# Patient Record
Sex: Female | Born: 1950 | Race: Black or African American | Hispanic: No | Marital: Single | Smoking: Current every day smoker
Health system: Southern US, Community
[De-identification: ages and names within clinical notes are randomized; demographics above are authoritative.]

## PROBLEM LIST (undated history)

## (undated) DIAGNOSIS — I639 Cerebral infarction, unspecified: Secondary | ICD-10-CM

## (undated) DIAGNOSIS — Z972 Presence of dental prosthetic device (complete) (partial): Secondary | ICD-10-CM

## (undated) DIAGNOSIS — I480 Paroxysmal atrial fibrillation: Secondary | ICD-10-CM

## (undated) DIAGNOSIS — D151 Benign neoplasm of heart: Secondary | ICD-10-CM

## (undated) DIAGNOSIS — K759 Inflammatory liver disease, unspecified: Secondary | ICD-10-CM

## (undated) DIAGNOSIS — F32A Depression, unspecified: Secondary | ICD-10-CM

## (undated) DIAGNOSIS — M549 Dorsalgia, unspecified: Secondary | ICD-10-CM

## (undated) DIAGNOSIS — G8929 Other chronic pain: Secondary | ICD-10-CM

## (undated) DIAGNOSIS — M797 Fibromyalgia: Secondary | ICD-10-CM

## (undated) DIAGNOSIS — I10 Essential (primary) hypertension: Secondary | ICD-10-CM

## (undated) DIAGNOSIS — K219 Gastro-esophageal reflux disease without esophagitis: Secondary | ICD-10-CM

## (undated) DIAGNOSIS — I495 Sick sinus syndrome: Secondary | ICD-10-CM

## (undated) DIAGNOSIS — F329 Major depressive disorder, single episode, unspecified: Secondary | ICD-10-CM

## (undated) DIAGNOSIS — F419 Anxiety disorder, unspecified: Secondary | ICD-10-CM

## (undated) DIAGNOSIS — Z973 Presence of spectacles and contact lenses: Secondary | ICD-10-CM

## (undated) DIAGNOSIS — M1990 Unspecified osteoarthritis, unspecified site: Secondary | ICD-10-CM

## (undated) HISTORY — PX: FRACTURE SURGERY: SHX138

## (undated) HISTORY — DX: Benign neoplasm of heart: D15.1

## (undated) HISTORY — PX: TONSILLECTOMY: SUR1361

## (undated) HISTORY — PX: ABDOMINAL HYSTERECTOMY: SHX81

## (undated) HISTORY — PX: BUNIONECTOMY: SHX129

## (undated) HISTORY — PX: SUBMANDIBULAR GLAND EXCISION: SHX2456

---

## 2005-01-18 ENCOUNTER — Emergency Department (HOSPITAL_COMMUNITY): Admission: EM | Admit: 2005-01-18 | Discharge: 2005-01-18 | Payer: Self-pay | Admitting: Emergency Medicine

## 2005-04-23 ENCOUNTER — Emergency Department (HOSPITAL_COMMUNITY): Admission: EM | Admit: 2005-04-23 | Discharge: 2005-04-23 | Payer: Self-pay | Admitting: Family Medicine

## 2005-05-10 ENCOUNTER — Emergency Department (HOSPITAL_COMMUNITY): Admission: EM | Admit: 2005-05-10 | Discharge: 2005-05-10 | Payer: Self-pay | Admitting: Family Medicine

## 2012-04-27 ENCOUNTER — Ambulatory Visit
Admission: RE | Admit: 2012-04-27 | Discharge: 2012-04-27 | Disposition: A | Discharge status: Home / Self Care | Payer: Medicare Other | Source: Ambulatory Visit | Attending: Nephrology | Admitting: Nephrology

## 2012-04-27 ENCOUNTER — Other Ambulatory Visit: Payer: Self-pay | Admitting: Nephrology

## 2012-04-27 DIAGNOSIS — I10 Essential (primary) hypertension: Secondary | ICD-10-CM

## 2012-05-19 ENCOUNTER — Ambulatory Visit: Payer: Self-pay | Admitting: Gastroenterology

## 2012-06-17 ENCOUNTER — Other Ambulatory Visit: Payer: Self-pay | Admitting: Nephrology

## 2012-06-17 ENCOUNTER — Ambulatory Visit
Admission: RE | Admit: 2012-06-17 | Discharge: 2012-06-17 | Disposition: A | Discharge status: Home / Self Care | Payer: Medicare Other | Source: Ambulatory Visit | Attending: Nephrology | Admitting: Nephrology

## 2012-06-17 DIAGNOSIS — M545 Low back pain, unspecified: Secondary | ICD-10-CM

## 2012-06-27 ENCOUNTER — Encounter (HOSPITAL_BASED_OUTPATIENT_CLINIC_OR_DEPARTMENT_OTHER): Payer: Self-pay | Admitting: *Deleted

## 2012-06-27 NOTE — Progress Notes (Signed)
To come in for bmet-called for ekg-notes dr frazier

## 2012-07-01 ENCOUNTER — Ambulatory Visit (HOSPITAL_BASED_OUTPATIENT_CLINIC_OR_DEPARTMENT_OTHER): Admission: RE | Admit: 2012-07-01 | Payer: Medicare Other | Source: Ambulatory Visit | Admitting: Otolaryngology

## 2012-07-01 ENCOUNTER — Encounter (HOSPITAL_BASED_OUTPATIENT_CLINIC_OR_DEPARTMENT_OTHER): Admission: RE | Payer: Self-pay | Source: Ambulatory Visit

## 2012-07-01 HISTORY — DX: Other chronic pain: G89.29

## 2012-07-01 HISTORY — DX: Fibromyalgia: M79.7

## 2012-07-01 HISTORY — DX: Presence of dental prosthetic device (complete) (partial): Z97.2

## 2012-07-01 HISTORY — DX: Major depressive disorder, single episode, unspecified: F32.9

## 2012-07-01 HISTORY — DX: Essential (primary) hypertension: I10

## 2012-07-01 HISTORY — DX: Other chronic pain: M54.9

## 2012-07-01 HISTORY — DX: Anxiety disorder, unspecified: F41.9

## 2012-07-01 HISTORY — DX: Presence of spectacles and contact lenses: Z97.3

## 2012-07-01 HISTORY — DX: Depression, unspecified: F32.A

## 2012-07-01 HISTORY — DX: Unspecified osteoarthritis, unspecified site: M19.90

## 2012-07-01 HISTORY — DX: Gastro-esophageal reflux disease without esophagitis: K21.9

## 2012-07-01 SURGERY — EXCISION, SUBMANDIBULAR GLAND
Anesthesia: General | Laterality: Right

## 2012-12-12 ENCOUNTER — Other Ambulatory Visit: Payer: Self-pay | Admitting: Internal Medicine

## 2012-12-12 DIAGNOSIS — B1920 Unspecified viral hepatitis C without hepatic coma: Secondary | ICD-10-CM

## 2012-12-19 ENCOUNTER — Ambulatory Visit
Admission: RE | Admit: 2012-12-19 | Discharge: 2012-12-19 | Disposition: A | Discharge status: Home / Self Care | Payer: Medicare Other | Source: Ambulatory Visit | Attending: Internal Medicine | Admitting: Internal Medicine

## 2012-12-19 DIAGNOSIS — B1920 Unspecified viral hepatitis C without hepatic coma: Secondary | ICD-10-CM

## 2015-10-07 DIAGNOSIS — I639 Cerebral infarction, unspecified: Secondary | ICD-10-CM

## 2015-10-07 HISTORY — DX: Cerebral infarction, unspecified: I63.9

## 2015-10-17 ENCOUNTER — Encounter (HOSPITAL_COMMUNITY): Payer: Self-pay | Admitting: Emergency Medicine

## 2015-10-17 ENCOUNTER — Inpatient Hospital Stay (HOSPITAL_COMMUNITY): Payer: Medicare Other

## 2015-10-17 ENCOUNTER — Inpatient Hospital Stay (HOSPITAL_COMMUNITY)
Admission: EM | Admit: 2015-10-17 | Discharge: 2015-10-24 | DRG: 065 | Disposition: A | Discharge status: Home Health | Payer: Medicare Other | Attending: Internal Medicine | Admitting: Internal Medicine

## 2015-10-17 ENCOUNTER — Emergency Department (HOSPITAL_COMMUNITY): Payer: Medicare Other

## 2015-10-17 DIAGNOSIS — M797 Fibromyalgia: Secondary | ICD-10-CM | POA: Diagnosis not present

## 2015-10-17 DIAGNOSIS — I6310 Cerebral infarction due to embolism of unspecified precerebral artery: Secondary | ICD-10-CM | POA: Diagnosis not present

## 2015-10-17 DIAGNOSIS — M549 Dorsalgia, unspecified: Secondary | ICD-10-CM | POA: Diagnosis present

## 2015-10-17 DIAGNOSIS — E669 Obesity, unspecified: Secondary | ICD-10-CM | POA: Diagnosis present

## 2015-10-17 DIAGNOSIS — R29810 Facial weakness: Secondary | ICD-10-CM | POA: Diagnosis present

## 2015-10-17 DIAGNOSIS — M6289 Other specified disorders of muscle: Secondary | ICD-10-CM

## 2015-10-17 DIAGNOSIS — R2990 Unspecified symptoms and signs involving the nervous system: Secondary | ICD-10-CM

## 2015-10-17 DIAGNOSIS — Z79899 Other long term (current) drug therapy: Secondary | ICD-10-CM | POA: Diagnosis not present

## 2015-10-17 DIAGNOSIS — E876 Hypokalemia: Secondary | ICD-10-CM | POA: Diagnosis not present

## 2015-10-17 DIAGNOSIS — G8191 Hemiplegia, unspecified affecting right dominant side: Secondary | ICD-10-CM | POA: Diagnosis present

## 2015-10-17 DIAGNOSIS — I6340 Cerebral infarction due to embolism of unspecified cerebral artery: Secondary | ICD-10-CM

## 2015-10-17 DIAGNOSIS — I639 Cerebral infarction, unspecified: Secondary | ICD-10-CM

## 2015-10-17 DIAGNOSIS — F17200 Nicotine dependence, unspecified, uncomplicated: Secondary | ICD-10-CM | POA: Insufficient documentation

## 2015-10-17 DIAGNOSIS — F17210 Nicotine dependence, cigarettes, uncomplicated: Secondary | ICD-10-CM | POA: Diagnosis present

## 2015-10-17 DIAGNOSIS — Z720 Tobacco use: Secondary | ICD-10-CM | POA: Insufficient documentation

## 2015-10-17 DIAGNOSIS — Z90710 Acquired absence of both cervix and uterus: Secondary | ICD-10-CM | POA: Diagnosis not present

## 2015-10-17 DIAGNOSIS — K219 Gastro-esophageal reflux disease without esophagitis: Secondary | ICD-10-CM | POA: Diagnosis present

## 2015-10-17 DIAGNOSIS — F329 Major depressive disorder, single episode, unspecified: Secondary | ICD-10-CM | POA: Diagnosis present

## 2015-10-17 DIAGNOSIS — G8929 Other chronic pain: Secondary | ICD-10-CM | POA: Diagnosis present

## 2015-10-17 DIAGNOSIS — I6789 Other cerebrovascular disease: Secondary | ICD-10-CM | POA: Diagnosis not present

## 2015-10-17 DIAGNOSIS — D219 Benign neoplasm of connective and other soft tissue, unspecified: Secondary | ICD-10-CM

## 2015-10-17 DIAGNOSIS — Z886 Allergy status to analgesic agent status: Secondary | ICD-10-CM

## 2015-10-17 DIAGNOSIS — D151 Benign neoplasm of heart: Secondary | ICD-10-CM | POA: Diagnosis not present

## 2015-10-17 DIAGNOSIS — F419 Anxiety disorder, unspecified: Secondary | ICD-10-CM | POA: Diagnosis present

## 2015-10-17 DIAGNOSIS — I10 Essential (primary) hypertension: Secondary | ICD-10-CM | POA: Diagnosis present

## 2015-10-17 DIAGNOSIS — Z791 Long term (current) use of non-steroidal anti-inflammatories (NSAID): Secondary | ICD-10-CM

## 2015-10-17 DIAGNOSIS — E785 Hyperlipidemia, unspecified: Secondary | ICD-10-CM | POA: Insufficient documentation

## 2015-10-17 DIAGNOSIS — M1990 Unspecified osteoarthritis, unspecified site: Secondary | ICD-10-CM | POA: Diagnosis present

## 2015-10-17 DIAGNOSIS — Z6830 Body mass index (BMI) 30.0-30.9, adult: Secondary | ICD-10-CM

## 2015-10-17 DIAGNOSIS — R471 Dysarthria and anarthria: Secondary | ICD-10-CM | POA: Diagnosis present

## 2015-10-17 DIAGNOSIS — I63413 Cerebral infarction due to embolism of bilateral middle cerebral arteries: Principal | ICD-10-CM | POA: Diagnosis present

## 2015-10-17 DIAGNOSIS — D213 Benign neoplasm of connective and other soft tissue of thorax: Secondary | ICD-10-CM | POA: Diagnosis not present

## 2015-10-17 DIAGNOSIS — I340 Nonrheumatic mitral (valve) insufficiency: Secondary | ICD-10-CM | POA: Diagnosis not present

## 2015-10-17 LAB — COMPREHENSIVE METABOLIC PANEL
ALBUMIN: 4.1 g/dL (ref 3.5–5.0)
ALK PHOS: 82 U/L (ref 38–126)
ALT: 27 U/L (ref 14–54)
AST: 40 U/L (ref 15–41)
Anion gap: 12 (ref 5–15)
BILIRUBIN TOTAL: 0.5 mg/dL (ref 0.3–1.2)
BUN: 8 mg/dL (ref 6–20)
CALCIUM: 9.2 mg/dL (ref 8.9–10.3)
CO2: 19 mmol/L — ABNORMAL LOW (ref 22–32)
Chloride: 109 mmol/L (ref 101–111)
Creatinine, Ser: 1.17 mg/dL — ABNORMAL HIGH (ref 0.44–1.00)
GFR calc Af Amer: 56 mL/min — ABNORMAL LOW (ref >60)
GFR calc non Af Amer: 49 mL/min — ABNORMAL LOW (ref >60)
GLUCOSE: 125 mg/dL — AB (ref 65–99)
Potassium: 3 mmol/L — ABNORMAL LOW (ref 3.5–5.1)
Sodium: 140 mmol/L (ref 135–145)
TOTAL PROTEIN: 7.9 g/dL (ref 6.5–8.1)

## 2015-10-17 LAB — I-STAT CHEM 8, ED
BUN: 11 mg/dL (ref 6–20)
CHLORIDE: 108 mmol/L (ref 101–111)
CREATININE: 1.1 mg/dL — AB (ref 0.44–1.00)
Calcium, Ion: 1.12 mmol/L — ABNORMAL LOW (ref 1.13–1.30)
GLUCOSE: 122 mg/dL — AB (ref 65–99)
HCT: 45 % (ref 36.0–46.0)
Hemoglobin: 15.3 g/dL — ABNORMAL HIGH (ref 12.0–15.0)
Potassium: 2.9 mmol/L — ABNORMAL LOW (ref 3.5–5.1)
Sodium: 143 mmol/L (ref 135–145)
TCO2: 21 mmol/L (ref 0–100)

## 2015-10-17 LAB — RAPID URINE DRUG SCREEN, HOSP PERFORMED
Amphetamines: NOT DETECTED
BARBITURATES: NOT DETECTED
Benzodiazepines: NOT DETECTED
Cocaine: NOT DETECTED
Opiates: POSITIVE — AB
TETRAHYDROCANNABINOL: NOT DETECTED

## 2015-10-17 LAB — URINALYSIS, ROUTINE W REFLEX MICROSCOPIC
Bilirubin Urine: NEGATIVE
Glucose, UA: NEGATIVE mg/dL
Hgb urine dipstick: NEGATIVE
KETONES UR: NEGATIVE mg/dL
LEUKOCYTES UA: NEGATIVE
NITRITE: NEGATIVE
PROTEIN: NEGATIVE mg/dL
Specific Gravity, Urine: 1.008 (ref 1.005–1.030)
pH: 5.5 (ref 5.0–8.0)

## 2015-10-17 LAB — DIFFERENTIAL
BASOS ABS: 0 10*3/uL (ref 0.0–0.1)
Basophils Relative: 0 %
EOS PCT: 4 %
Eosinophils Absolute: 0.2 10*3/uL (ref 0.0–0.7)
LYMPHS ABS: 3.3 10*3/uL (ref 0.7–4.0)
LYMPHS PCT: 55 %
Monocytes Absolute: 0.7 10*3/uL (ref 0.1–1.0)
Monocytes Relative: 11 %
NEUTROS PCT: 30 %
Neutro Abs: 1.8 10*3/uL (ref 1.7–7.7)

## 2015-10-17 LAB — CBC
HEMATOCRIT: 39.4 % (ref 36.0–46.0)
HEMOGLOBIN: 12.3 g/dL (ref 12.0–15.0)
MCH: 28.7 pg (ref 26.0–34.0)
MCHC: 31.2 g/dL (ref 30.0–36.0)
MCV: 92.1 fL (ref 78.0–100.0)
Platelets: 208 10*3/uL (ref 150–400)
RBC: 4.28 MIL/uL (ref 3.87–5.11)
RDW: 14 % (ref 11.5–15.5)
WBC: 6.1 10*3/uL (ref 4.0–10.5)

## 2015-10-17 LAB — I-STAT TROPONIN, ED: Troponin i, poc: 0 ng/mL (ref 0.00–0.08)

## 2015-10-17 LAB — APTT: aPTT: 35 seconds (ref 24–37)

## 2015-10-17 LAB — PROTIME-INR
INR: 1.08 (ref 0.00–1.49)
Prothrombin Time: 14.2 seconds (ref 11.6–15.2)

## 2015-10-17 LAB — ETHANOL: Alcohol, Ethyl (B): <5 mg/dL (ref <5)

## 2015-10-17 LAB — CBG MONITORING, ED: Glucose-Capillary: 108 mg/dL — ABNORMAL HIGH (ref 65–99)

## 2015-10-17 MED ORDER — TIZANIDINE HCL 4 MG PO TABS
4.0000 mg | ORAL_TABLET | Freq: Three times a day (TID) | ORAL | Status: DC | PRN
Start: 2015-10-17 — End: 2015-10-24
  Filled 2015-10-17: qty 1

## 2015-10-17 MED ORDER — POTASSIUM CHLORIDE CRYS ER 20 MEQ PO TBCR
40.0000 meq | EXTENDED_RELEASE_TABLET | Freq: Once | ORAL | Status: AC
  Administered 2015-10-17: 40 meq via ORAL
  Filled 2015-10-17: qty 2

## 2015-10-17 MED ORDER — TIZANIDINE HCL 4 MG PO CAPS: 4.0000 mg | ORAL_CAPSULE | Freq: Three times a day (TID) | ORAL | Status: DC | PRN

## 2015-10-17 MED ORDER — HEPARIN SODIUM (PORCINE) 5000 UNIT/ML IJ SOLN
5000.0000 [IU] | Freq: Three times a day (TID) | INTRAMUSCULAR | Status: DC
  Administered 2015-10-17 – 2015-10-18 (×2): 5000 [IU] via SUBCUTANEOUS
  Filled 2015-10-17 (×3): qty 1

## 2015-10-17 MED ORDER — PANTOPRAZOLE SODIUM 40 MG PO TBEC
80.0000 mg | DELAYED_RELEASE_TABLET | Freq: Every day | ORAL | Status: DC
  Administered 2015-10-17 – 2015-10-24 (×7): 80 mg via ORAL
  Filled 2015-10-17 (×7): qty 2

## 2015-10-17 MED ORDER — MIRTAZAPINE 15 MG PO TABS
15.0000 mg | ORAL_TABLET | Freq: Every day | ORAL | Status: DC
  Administered 2015-10-17: 15 mg via ORAL
  Filled 2015-10-17: qty 1

## 2015-10-17 MED ORDER — IOHEXOL 350 MG/ML SOLN
75.0000 mL | Freq: Once | INTRAVENOUS | Status: AC | PRN
  Administered 2015-10-17: 100 mL via INTRAVENOUS

## 2015-10-17 MED ORDER — BUSPIRONE HCL 10 MG PO TABS
15.0000 mg | ORAL_TABLET | Freq: Two times a day (BID) | ORAL | Status: DC
  Administered 2015-10-17 – 2015-10-24 (×13): 15 mg via ORAL
  Filled 2015-10-17 (×13): qty 2

## 2015-10-17 MED ORDER — DULOXETINE HCL 60 MG PO CPEP
60.0000 mg | ORAL_CAPSULE | Freq: Every day | ORAL | Status: DC
  Administered 2015-10-18 – 2015-10-24 (×6): 60 mg via ORAL
  Filled 2015-10-17 (×6): qty 1

## 2015-10-17 MED ORDER — STROKE: EARLY STAGES OF RECOVERY BOOK
Freq: Once | Status: AC
  Administered 2015-10-17: 1
  Filled 2015-10-17: qty 1

## 2015-10-17 MED ORDER — HYDROCODONE-ACETAMINOPHEN 10-325 MG PO TABS
1.0000 | ORAL_TABLET | Freq: Four times a day (QID) | ORAL | Status: DC | PRN
  Administered 2015-10-18 – 2015-10-24 (×13): 1 via ORAL
  Filled 2015-10-17 (×13): qty 1

## 2015-10-17 MED ORDER — POTASSIUM CHLORIDE 10 MEQ/100ML IV SOLN
10.0000 meq | INTRAVENOUS | Status: AC
  Administered 2015-10-17 (×2): 10 meq via INTRAVENOUS
  Filled 2015-10-17 (×2): qty 100

## 2015-10-17 MED ORDER — DICLOFENAC SODIUM 75 MG PO TBEC
75.0000 mg | DELAYED_RELEASE_TABLET | Freq: Two times a day (BID) | ORAL | Status: DC
  Administered 2015-10-17: 75 mg via ORAL
  Filled 2015-10-17 (×4): qty 1

## 2015-10-17 MED ORDER — PREGABALIN 75 MG PO CAPS: 75.0000 mg | ORAL_CAPSULE | Freq: Two times a day (BID) | ORAL | Status: DC

## 2015-10-17 MED ORDER — PREGABALIN 75 MG PO CAPS
150.0000 mg | ORAL_CAPSULE | Freq: Every day | ORAL | Status: DC
  Administered 2015-10-18 – 2015-10-24 (×6): 150 mg via ORAL
  Filled 2015-10-17 (×6): qty 2

## 2015-10-17 MED ORDER — PREGABALIN 75 MG PO CAPS
75.0000 mg | ORAL_CAPSULE | Freq: Every day | ORAL | Status: DC
  Administered 2015-10-17 – 2015-10-23 (×7): 75 mg via ORAL
  Filled 2015-10-17 (×7): qty 1

## 2015-10-17 MED ORDER — ZOLPIDEM TARTRATE 5 MG PO TABS
10.0000 mg | ORAL_TABLET | Freq: Every day | ORAL | Status: DC
  Administered 2015-10-17 – 2015-10-23 (×7): 10 mg via ORAL
  Filled 2015-10-17 (×7): qty 2

## 2015-10-17 NOTE — Code Documentation (Signed)
64yo female arriving to [facility] via [place] at 1600.  EMS reports that the patient was at home with family when she went to put plastic on the window.  Family later found her slumped over in a chair.  EMS called and activated a code stroke for right arm flaccid, left facial droop and garbled speech.  Patient with h/o depression, anxiety, fibromyalgia, HTN, DDD, and smoking.  Stroke team at the bedside on patient arrival.  Labs drawn and patient cleared by Dr. Darl Householder.  Patient to CT 1.  Patient back to Trauma A.  NIHSS 2, see documentation for details and code stroke times.  Patient with right arm drift and garbled speech on exam.  Dr. Nicole Kindred at the bedside for exam. Patient has been tearful since arrival.  No acute stroke treatment at this time.  Code stroke canceled at 1612 per Dr. Nicole Kindred.  Bedside handoff with ED RN Janett Billow.

## 2015-10-17 NOTE — Consult Note (Signed)
Admission H&P    Chief Complaint: Right-sided weakness.  HPI: Julia Schwartz is an 64 y.o. female with a history of hypertension, anxiety, depression and fibromyalgia, brought to the emergency room and code stroke status with a complaint of new onset right-sided weakness. She has no previous history of stroke nor TIA. She has not been on antiplatelet therapy. CT scan of the head showed no acute findings. Hyperdensity was noted involving the left ICA terminus. Significance is unclear. Patient's exam showed no clear objective deficits. She was also emotionally labile. Code stroke was subsequently canceled.  Past Medical History  Diagnosis Date  . Hypertension   . Anxiety   . Depression   . Arthritis   . GERD (gastroesophageal reflux disease)   . Chronic back pain   . Wears dentures     top-partial bottom  . Wears glasses   . Fibromyalgia     Past Surgical History  Procedure Laterality Date  . Submandibular gland excision      left  . Abdominal hysterectomy    . Tonsillectomy    . Bunionectomy      both feet  . Fracture surgery      fx lt ankle    History reviewed. No pertinent family history. Social History:  reports that she has been smoking.  She does not have any smokeless tobacco history on file. She reports that she does not drink alcohol or use illicit drugs.  Allergies:  Allergies  Allergen Reactions  . Ultram [Tramadol] Hives    Medications: Patient's preadmission medications were reviewed by me.  ROS: History obtained from patient and family.  General ROS: negative for - chills, fatigue, fever, night sweats, weight gain or weight loss Psychological ROS: negative for - behavioral disorder, hallucinations, memory difficulties, mood swings or suicidal ideation Ophthalmic ROS: negative for - blurry vision, double vision, eye pain or loss of vision ENT ROS: negative for - epistaxis, nasal discharge, oral lesions, sore throat, tinnitus or vertigo Allergy and  Immunology ROS: negative for - hives or itchy/watery eyes Hematological and Lymphatic ROS: negative for - bleeding problems, bruising or swollen lymph nodes Endocrine ROS: negative for - galactorrhea, hair pattern changes, polydipsia/polyuria or temperature intolerance Respiratory ROS: negative for - cough, hemoptysis, shortness of breath or wheezing Cardiovascular ROS: negative for - chest pain, dyspnea on exertion, edema or irregular heartbeat Gastrointestinal ROS: negative for - abdominal pain, diarrhea, hematemesis, nausea/vomiting or stool incontinence Genito-Urinary ROS: negative for - dysuria, hematuria, incontinence or urinary frequency/urgency Musculoskeletal ROS: negative for - joint swelling or muscular weakness Neurological ROS: as noted in HPI Dermatological ROS: negative for rash and skin lesion changes  Physical Examination: Blood pressure 140/89, pulse 106, temperature 100.1 F (37.8 C), temperature source Oral, SpO2 99 %.  HEENT-  Normocephalic, no lesions, without obvious abnormality.  Normal external eye and conjunctiva.  Normal TM's bilaterally.  Normal auditory canals and external ears. Normal external nose, mucus membranes and septum.  Normal pharynx. Neck supple with no masses, nodes, nodules or enlargement. Cardiovascular - regular rate and rhythm, S1, S2 normal, no murmur, click, rub or gallop Lungs - chest clear, no wheezing, rales, normal symmetric air entry Abdomen - soft, non-tender; bowel sounds normal; no masses,  no organomegaly Extremities - no joint deformities, effusion, or inflammation and no edema  Neurologic Examination: Mental Status: Alert, oriented, emotional and labile with tearfulness.  Speech equivocally slurred without evidence of aphasia. Able to follow commands but not cooperative at times. Cranial Nerves: II-Visual fields were  normal. III/IV/VI-Pupils were equal and reacted normally to light. Extraocular movements were full and conjugate.     V/VII-no facial numbness and no facial weakness. VIII-normal. X-equivocal dysarthria. XI: trapezius strength/neck flexion strength normal bilaterally XII-midline tongue extension with normal strength. Motor: Minimal drift of right upper extremity compared to left, with normal use of right upper extremity against gravity when distracted. Motor exam is otherwise unremarkable. Sensory: Normal throughout. Deep Tendon Reflexes: 2+ and symmetric. Plantars: Mute bilaterally Cerebellar: Normal finger-to-nose testing normal with use of left upper extremity; poor effort with testing of right upper extremity. Carotid auscultation: Normal  Results for orders placed or performed during the hospital encounter of 10/17/15 (from the past 48 hour(s))  Ethanol     Status: None   Collection Time: 10/17/15  4:06 PM  Result Value Ref Range   Alcohol, Ethyl (B) <5 <5 mg/dL    Comment:        LOWEST DETECTABLE LIMIT FOR SERUM ALCOHOL IS 5 mg/dL FOR MEDICAL PURPOSES ONLY   Protime-INR     Status: None   Collection Time: 10/17/15  4:06 PM  Result Value Ref Range   Prothrombin Time 14.2 11.6 - 15.2 seconds   INR 1.08 0.00 - 1.49  APTT     Status: None   Collection Time: 10/17/15  4:06 PM  Result Value Ref Range   aPTT 35 24 - 37 seconds  CBC     Status: None   Collection Time: 10/17/15  4:06 PM  Result Value Ref Range   WBC 6.1 4.0 - 10.5 K/uL   RBC 4.28 3.87 - 5.11 MIL/uL   Hemoglobin 12.3 12.0 - 15.0 g/dL   HCT 39.4 36.0 - 46.0 %   MCV 92.1 78.0 - 100.0 fL   MCH 28.7 26.0 - 34.0 pg   MCHC 31.2 30.0 - 36.0 g/dL   RDW 14.0 11.5 - 15.5 %   Platelets 208 150 - 400 K/uL  Differential     Status: None   Collection Time: 10/17/15  4:06 PM  Result Value Ref Range   Neutrophils Relative % 30 %   Neutro Abs 1.8 1.7 - 7.7 K/uL   Lymphocytes Relative 55 %   Lymphs Abs 3.3 0.7 - 4.0 K/uL   Monocytes Relative 11 %   Monocytes Absolute 0.7 0.1 - 1.0 K/uL   Eosinophils Relative 4 %   Eosinophils  Absolute 0.2 0.0 - 0.7 K/uL   Basophils Relative 0 %   Basophils Absolute 0.0 0.0 - 0.1 K/uL  Comprehensive metabolic panel     Status: Abnormal   Collection Time: 10/17/15  4:06 PM  Result Value Ref Range   Sodium 140 135 - 145 mmol/L   Potassium 3.0 (L) 3.5 - 5.1 mmol/L   Chloride 109 101 - 111 mmol/L   CO2 19 (L) 22 - 32 mmol/L   Glucose, Bld 125 (H) 65 - 99 mg/dL   BUN 8 6 - 20 mg/dL   Creatinine, Ser 1.17 (H) 0.44 - 1.00 mg/dL   Calcium 9.2 8.9 - 10.3 mg/dL   Total Protein 7.9 6.5 - 8.1 g/dL   Albumin 4.1 3.5 - 5.0 g/dL   AST 40 15 - 41 U/L   ALT 27 14 - 54 U/L   Alkaline Phosphatase 82 38 - 126 U/L   Total Bilirubin 0.5 0.3 - 1.2 mg/dL   GFR calc non Af Amer 49 (L) >60 mL/min   GFR calc Af Amer 56 (L) >60 mL/min    Comment: (  NOTE) The eGFR has been calculated using the CKD EPI equation. This calculation has not been validated in all clinical situations. eGFR's persistently <60 mL/min signify possible Chronic Kidney Disease.    Anion gap 12 5 - 15  I-stat troponin, ED (not at [facility], [facility])     Status: None   Collection Time: 10/17/15  4:08 PM  Result Value Ref Range   Troponin i, poc 0.00 0.00 - 0.08 ng/mL   Comment 3            Comment: Due to the release kinetics of cTnI, a negative result within the first hours of the onset of symptoms does not rule out myocardial infarction with certainty. If myocardial infarction is still suspected, repeat the test at appropriate intervals.   I-Stat Chem 8, ED  (not at [facility], [facility])     Status: Abnormal   Collection Time: 10/17/15  4:10 PM  Result Value Ref Range   Sodium 143 135 - 145 mmol/L   Potassium 2.9 (L) 3.5 - 5.1 mmol/L   Chloride 108 101 - 111 mmol/L   BUN 11 6 - 20 mg/dL   Creatinine, Ser 1.10 (H) 0.44 - 1.00 mg/dL   Glucose, Bld 122 (H) 65 - 99 mg/dL   Calcium, Ion 1.12 (L) 1.13 - 1.30 mmol/L   TCO2 21 0 - 100 mmol/L   Hemoglobin 15.3 (H) 12.0 - 15.0 g/dL   HCT 45.0 36.0 - 46.0 %  CBG monitoring, ED     Status:  Abnormal   Collection Time: 10/17/15  4:15 PM  Result Value Ref Range   Glucose-Capillary 108 (H) 65 - 99 mg/dL   Ct Head Wo Contrast  10/17/2015  ADDENDUM REPORT: 10/17/2015 16:40 ADDENDUM: Study discussed by telephone with Dr. Wallie Char on 10/17/2015 at 1624 hours. Electronically Signed   By: Genevie Ann M.D.   On: 10/17/2015 16:40  10/17/2015  CLINICAL DATA:  64 year old female code stroke. Right side weakness and abnormal speech. Last seen normal 1510 hours. Initial encounter. EXAM: CT HEAD WITHOUT CONTRAST TECHNIQUE: Contiguous axial images were obtained from the base of the skull through the vertex without intravenous contrast. COMPARISON:  None. FINDINGS: Visualized paranasal sinuses and mastoids are clear. No acute osseous abnormality identified. Visualized orbits and scalp soft tissues are within normal limits. Calcified atherosclerosis at the skull base.  Dural calcifications. Asymmetric hyperdensity at the left ICA terminus on series 201, image 7. No cortically based acute infarct identified. No acute intracranial hemorrhage identified. No midline shift, mass effect, or evidence of intracranial mass lesion. Gray-white matter differentiation is within normal limits throughout the brain. IMPRESSION: Suspicious asymmetric hyperdensity at the left ICA terminus, but otherwise normal for age noncontrast CT appearance of the brain. Electronically Signed: By: Genevie Ann M.D. On: 10/17/2015 16:20    Assessment/Plan 64 year old lady presenting with complaint of right-sided weakness with no objective signs of an acute neurologic deficit. Psychophysiologic factors are strongly suspected and contributing to this patient's symptomatology.  No further neurodiagnostic studies are indicated in follow-up MRI regarding left ICA terminus or density. Also recommend considering psychiatric evaluation with the patient's complaints of right-sided weakness continues.  C.R. Nicole Kindred, [facility] [phone]  10/17/2015, 5:26 PM

## 2015-10-17 NOTE — Progress Notes (Signed)
Called regarding MRI results. She is outside of the IV tpa window at this point but from the description of Dr. Nicole Kindred, it sounds like symptoms would not have merited tpa in any case.  She will need further evaluation regarding the source of her embolis, likely cardiac or aortic source.   1) CTA head and neck 2) Echocardiogram 3) a1c, LDL 4) frequent neuro checks 5) permissive htn 6) NPO pending bedside nursing swallow screen.  7) PT, OT, speech evals if needed 8) stroke team to follow.   Roland Rack, MD [facility] [phone]  If 7pm- 7am, please page neurology on call as listed in [place].

## 2015-10-17 NOTE — Discharge Instructions (Signed)
Stroke Prevention °Some health problems and behaviors may make it more likely for you to have a stroke. Below are ways to lessen your risk of having a stroke.  °· Be active for at least 30 minutes on most or all days. °· Do not smoke. Try not to be around others who smoke. °· Do not drink too much alcohol. °¨ Do not have more than 2 drinks a day if you are a man. °¨ Do not have more than 1 drink a day if you are a woman and are not pregnant. °· Eat healthy foods, such as fruits and vegetables. If you were put on a specific diet, follow the diet as told. °· Keep your cholesterol levels under control through diet and medicines. Look for foods that are low in saturated fat, trans fat, cholesterol, and are high in fiber. °· If you have diabetes, follow all diet plans and take your medicine as told. °· Ask your doctor if you need treatment to lower your blood pressure. If you have high blood pressure (hypertension), follow all diet plans and take your medicine as told by your doctor. °· If you are 18-39 years old, have your blood pressure checked every 3-5 years. If you are age 40 or older, have your blood pressure checked every year. °· Keep a healthy weight. Eat foods that are low in calories, salt, saturated fat, trans fat, and cholesterol. °· Do not take drugs. °· Avoid birth control pills, if this applies. Talk to your doctor about the risks of taking birth control pills. °· Talk to your doctor if you have sleep problems (sleep apnea). °· Take all medicine as told by your doctor. °¨ You may be told to take aspirin or blood thinner medicine. Take this medicine as told by your doctor. °¨ Understand your medicine instructions. °· Make sure any other conditions you have are being taken care of. °GET HELP RIGHT AWAY IF: °· You suddenly lose feeling (you feel numb) or have weakness in your face, arm, or leg. °· Your face or eyelid hangs down to one side. °· You suddenly feel confused. °· You have trouble talking (aphasia)  or understanding what people are saying. °· You suddenly have trouble seeing in one or both eyes. °· You suddenly have trouble walking. °· You are dizzy. °· You lose your balance or your movements are clumsy (uncoordinated). °· You suddenly have a very bad headache and you do not know the cause. °· You have new chest pain. °· Your heart feels like it is fluttering or skipping a beat (irregular heartbeat). °Do not wait to see if the symptoms above go away. Get help right away. Call your local emergency services (911 in [place] Do not drive yourself to the hospital. °  °This information is not intended to replace advice given to you by your health care provider. Make sure you discuss any questions you have with your health care provider. °  °Document Released: 04/05/2012 Document Revised: 10/26/2014 Document Reviewed: 04/07/2013 °Elsevier Interactive Patient Education ©2016 [employer] ° °

## 2015-10-17 NOTE — ED Notes (Signed)
Per EMS: pt found slumped in chair with garbled speech and right sided weakness; pt tearful at present

## 2015-10-17 NOTE — ED Provider Notes (Addendum)
CSN: [id number]     Arrival date & time 10/17/15  1600 History   First MD Initiated Contact with Patient 10/17/15 1603     Chief Complaint  Patient presents with  . Code Stroke   [name] w/PMH of HTN, DDD, GERD, fibromyalgia, and OA who is a smoker who presents by EMS for stroke sx. These started at [facility] and were witnessed by granddaughter. Pt had difficulty speaking, became tearful, and right side of body became weak/paralyzed and pt had left sided facial droop.  No recent illness or trauma.   (Consider location/radiation/quality/duration/timing/severity/associated sxs/prior Treatment) Patient is a 64 y.o. female presenting with Acute Neurological Problem.  Cerebrovascular Accident This is a new problem. The current episode started today. The problem occurs constantly. The problem has been unchanged. Associated symptoms include weakness (right sided). Pertinent negatives include no abdominal pain, chest pain, chills, fever, headaches, nausea, visual change or vomiting. Nothing aggravates the symptoms. She has tried nothing for the symptoms. The treatment provided no relief.    Past Medical History  Diagnosis Date  . Hypertension   . Anxiety   . Depression   . Arthritis   . GERD (gastroesophageal reflux disease)   . Chronic back pain   . Wears dentures     top-partial bottom  . Wears glasses   . Fibromyalgia    Past Surgical History  Procedure Laterality Date  . Submandibular gland excision      left  . Abdominal hysterectomy    . Tonsillectomy    . Bunionectomy      both feet  . Fracture surgery      fx lt ankle   History reviewed. No pertinent family history. Social History  Substance Use Topics  . Smoking status: Current Every Day Smoker -- 0.50 packs/day  . Smokeless tobacco: None  . Alcohol Use: No   OB History    No data available     Review of Systems  Unable to perform ROS: Mental status change  Constitutional: Negative for fever and chills.  Respiratory:  Negative for shortness of breath.   Cardiovascular: Negative for chest pain, palpitations and leg swelling.  Gastrointestinal: Negative for nausea, vomiting, abdominal pain, diarrhea, constipation and abdominal distention.  Genitourinary: Negative for dysuria, frequency, flank pain and decreased urine volume.  Skin: Negative for wound.  Neurological: Positive for weakness (right sided). Negative for dizziness, speech difficulty, light-headedness and headaches.  All other systems reviewed and are negative.     Allergies  Ultram  Home Medications   Prior to Admission medications   Medication Sig Start Date End Date Taking? Authorizing Provider  amLODipine-benazepril (LOTREL) 10-40 MG per capsule Take 1 capsule by mouth daily.   Yes Historical Provider, MD  busPIRone (BUSPAR) 15 MG tablet Take 15 mg by mouth 2 (two) times daily.    Yes Historical Provider, MD  diclofenac (VOLTAREN) 75 MG EC tablet Take 75 mg by mouth 2 (two) times daily.   Yes Historical Provider, MD  DULoxetine (CYMBALTA) 60 MG capsule Take 60 mg by mouth daily.   Yes Historical Provider, MD  furosemide (LASIX) 20 MG tablet Take 20 mg by mouth daily.   Yes Historical Provider, MD  HYDROcodone-acetaminophen (NORCO) 10-325 MG per tablet Take 1 tablet by mouth every 6 (six) hours as needed for moderate pain or severe pain.    Yes Historical Provider, MD  mirtazapine (REMERON) 15 MG tablet Take 15 mg by mouth at bedtime.   Yes Historical Provider, MD  omeprazole (PRILOSEC) 10 MG capsule Take 10 mg by mouth daily.   Yes Historical Provider, MD  omeprazole (PRILOSEC) 20 MG capsule Take 20 mg by mouth daily.   Yes Historical Provider, MD  pregabalin (LYRICA) 75 MG capsule Take 75-150 mg by mouth 2 (two) times daily. 150mg  in the morning and 75mg  in the evening   Yes Historical Provider, MD  tiZANidine (ZANAFLEX) 4 MG capsule Take 4 mg by mouth 3 (three) times daily as needed for muscle spasms.    Yes Historical Provider, MD   zolpidem (AMBIEN) 10 MG tablet Take 10 mg by mouth at bedtime.    Yes Historical Provider, MD   BP 108/75 mmHg  Pulse 94  Temp(Src) 100.1 F (37.8 C) (Oral)  Resp 13  SpO2 100% Physical Exam  Constitutional: She appears well-developed and well-nourished. No distress.  tearful  HENT:  Head: Normocephalic and atraumatic.  Cardiovascular: Normal rate, regular rhythm, normal heart sounds and intact distal pulses.  Exam reveals no gallop and no friction rub.   No murmur heard. Pulmonary/Chest: Effort normal and breath sounds normal. No respiratory distress. She has no wheezes. She has no rales. She exhibits no tenderness.  Abdominal: Soft. Bowel sounds are normal. She exhibits no distension and no mass. There is no tenderness. There is no rebound and no guarding.  Lymphadenopathy:    She has no cervical adenopathy.  Neurological: She is alert. A cranial nerve deficit (unclear, pt not talking) is present. Coordination (not moving arms/legs, unclear if this is true deficit or volitional) abnormal. GCS eye subscore is 4. GCS verbal subscore is 1. GCS motor subscore is 4.  Skin: Skin is warm and dry. She is not diaphoretic.  Psychiatric: She is withdrawn. She exhibits a depressed mood. She expresses suicidal ideation. She is noncommunicative.  Nursing note and vitals reviewed.   ED Course  .Critical Care Performed by: Sherian Maroon Authorized by: Sherian Maroon Total critical care time: 15 minutes Critical care time was exclusive of separately billable procedures and treating other patients. Critical care was necessary to treat or prevent imminent or life-threatening deterioration of the following conditions: CNS failure or compromise. Critical care was time spent personally by me on the following activities: obtaining history from patient or surrogate, ordering and performing treatments and interventions, re-evaluation of patient's condition, discussions with consultants, ordering and review of  laboratory studies and examination of patient. Subsequent provider of critical care: I assumed direction of critical care for this patient from another provider of my specialty.   (including critical care time) Labs Review Labs Reviewed  COMPREHENSIVE METABOLIC PANEL - Abnormal; Notable for the following:    Potassium 3.0 (*)    CO2 19 (*)    Glucose, Bld 125 (*)    Creatinine, Ser 1.17 (*)    GFR calc non Af Amer 49 (*)    GFR calc Af Amer 56 (*)    All other components within normal limits  URINE RAPID DRUG SCREEN, HOSP PERFORMED - Abnormal; Notable for the following:    Opiates POSITIVE (*)    All other components within normal limits  I-STAT CHEM 8, ED - Abnormal; Notable for the following:    Potassium 2.9 (*)    Creatinine, Ser 1.10 (*)    Glucose, Bld 122 (*)    Calcium, Ion 1.12 (*)    Hemoglobin 15.3 (*)    All other components within normal limits  CBG MONITORING, ED - Abnormal; Notable for the following:    Glucose-Capillary  108 (*)    All other components within normal limits  ETHANOL  PROTIME-INR  APTT  CBC  DIFFERENTIAL  URINALYSIS, ROUTINE W REFLEX MICROSCOPIC (NOT AT [facility])  Randolm Idol, ED    Imaging Review Ct Head Wo Contrast  10/17/2015  ADDENDUM REPORT: 10/17/2015 16:40 ADDENDUM: Study discussed by telephone with Dr. Wallie Char on 10/17/2015 at 1624 hours. Electronically Signed   By: Genevie Ann M.D.   On: 10/17/2015 16:40  10/17/2015  CLINICAL DATA:  64 year old female code stroke. Right side weakness and abnormal speech. Last seen normal 1510 hours. Initial encounter. EXAM: CT HEAD WITHOUT CONTRAST TECHNIQUE: Contiguous axial images were obtained from the base of the skull through the vertex without intravenous contrast. COMPARISON:  None. FINDINGS: Visualized paranasal sinuses and mastoids are clear. No acute osseous abnormality identified. Visualized orbits and scalp soft tissues are within normal limits. Calcified atherosclerosis at the skull  base.  Dural calcifications. Asymmetric hyperdensity at the left ICA terminus on series 201, image 7. No cortically based acute infarct identified. No acute intracranial hemorrhage identified. No midline shift, mass effect, or evidence of intracranial mass lesion. Gray-white matter differentiation is within normal limits throughout the brain. IMPRESSION: Suspicious asymmetric hyperdensity at the left ICA terminus, but otherwise normal for age noncontrast CT appearance of the brain. Electronically Signed: By: Genevie Ann M.D. On: 10/17/2015 16:20   Mr Brain Wo Contrast  10/17/2015  CLINICAL DATA:  Code stroke status with new onset of right-sided weakness. EXAM: MRI HEAD WITHOUT CONTRAST TECHNIQUE: Multiplanar, multiecho pulse sequences of the brain and surrounding structures were obtained without intravenous contrast. COMPARISON:  Head CT same day FINDINGS: No abnormality is seen affecting the brainstem. No acute infarction affecting the cerebellum. I think there are a few old small vessel infarctions within the cerebellum. Cerebral hemispheres show background pattern of a few small foci of T2 and FLAIR signal in the white matter suggesting mild small vessel disease. There are scattered punctate acute infarctions in both hemispheres, much more extensive on the left than the right, largely in a watershed distribution. These could represent a shower of micro embolic emboli or could actually relate to watershed infarction. No large confluent infarction. No mass effect or gross hemorrhage. No hydrocephalus. No extra-axial collection. No pituitary mass. No inflammatory sinus disease. No skull or skullbase lesion. Major vessels at the base of the brain show flow. IMPRESSION: Innumerable punctate infarctions affecting both cerebral hemispheres, much more extensive on the left than the right, which could be due to a shower of micro emboli or watershed infarctions. No large confluent infarction. No mass effect or hemorrhage.  Electronically Signed   By: Nelson Chimes M.D.   On: 10/17/2015 19:09   I have personally reviewed and evaluated these images and lab results as part of my medical decision-making.   EKG Interpretation   Date/Time:  Thursday October 17 2015 16:22:12 EST Ventricular Rate:  103 PR Interval:  129 QRS Duration: 92 QT Interval:  362 QTC Calculation: 474 R Axis:   68 Text Interpretation:  Sinus tachycardia Low voltage, precordial leads  Borderline T wave abnormalities No previous ECGs available Confirmed by  YAO  MD, DAVID ([id number]) on 10/17/2015 4:25:25 PM      MDM   Final diagnoses:  Stroke-like symptoms  Hypokalemia   [name] who presents as code stroke. See HPI for details. On arrival, code stroke initiated. Airway intact, brought to CT scanner.   Pt now able to speak, following commands. She is  tearful and endorses she's been depressed recently and wants to die. She denies any HI or active SI w/plan.   4:21 PM Code stroke cancelled by Neurology given that story and exam not indicative of acute stroke. Head CT wnl. Will proceed w/brain MRI to ensure no sign of stroke.   HypoK-->repleted.   MRI shows many infarctions scalletered through both hemispheres: R>>L. Will admit for con't workup and observation. Pt remains stable w/no active deficits on exam. Admit to hospitalist for cont'd workup and treatment.   8:20 PM  Pt now demanding she be discharged and doesn't want to be admitted. She understands she has stroke burden on MRI and is at high risk for having more severe future strokes. Despite this she still opts to go home. I feel she has the reasoning capabilities to make her own medical decisions.  Pt to sign AMA paperwork.  8:29 PM Pt now shivering. Given low grade fever, concern for septic emboli. Hospitalist and myself urging pt to reconsider and to stay for workup. Pt admitted.     Pt was seen under the supervision of Dr. Darl Householder.     Sherian Maroon, MD 10/17/15 2002  Wandra Arthurs, MD 10/17/15 2013   Sherian Maroon, MD 10/17/15 [id number]  Wandra Arthurs, MD 10/17/15 [phone]

## 2015-10-17 NOTE — H&P (Signed)
[facility] History and Physical  Julia Schwartz [id number] DOB: [birth year] DOA: 10/17/2015  Referring physician: EDP PCP: No primary care provider on file.   Chief Complaint: R sided weakness   HPI: Julia Schwartz is a 64 y.o. female with h/o HTN, anxiety, depression.  Brought to ED as code stroke status for new onset R sided weakness, code stroke status was canceled as she had returned to baseline.  No h/o prior stroke or TIA.  CT scan of head was negative, MRI brain; however, shows bilateral innumerable punctate acute infarcts c/w embolic showering.  Review of Systems: Systems reviewed.  As above, otherwise negative  Past Medical History  Diagnosis Date  . Hypertension   . Anxiety   . Depression   . Arthritis   . GERD (gastroesophageal reflux disease)   . Chronic back pain   . Wears dentures     top-partial bottom  . Wears glasses   . Fibromyalgia    Past Surgical History  Procedure Laterality Date  . Submandibular gland excision      left  . Abdominal hysterectomy    . Tonsillectomy    . Bunionectomy      both feet  . Fracture surgery      fx lt ankle   Social History:  reports that she has been smoking.  She does not have any smokeless tobacco history on file. She reports that she does not drink alcohol or use illicit drugs.  Allergies  Allergen Reactions  . Ultram [Tramadol] Hives    History reviewed. No pertinent family history.   Prior to Admission medications   Medication Sig Start Date End Date Taking? Authorizing Provider  amLODipine-benazepril (LOTREL) 10-40 MG per capsule Take 1 capsule by mouth daily.   Yes Historical Provider, MD  busPIRone (BUSPAR) 15 MG tablet Take 15 mg by mouth 2 (two) times daily.    Yes Historical Provider, MD  diclofenac (VOLTAREN) 75 MG EC tablet Take 75 mg by mouth 2 (two) times daily.   Yes Historical Provider, MD  DULoxetine (CYMBALTA) 60 MG capsule Take 60 mg by mouth daily.   Yes Historical Provider, MD   furosemide (LASIX) 20 MG tablet Take 20 mg by mouth daily.   Yes Historical Provider, MD  HYDROcodone-acetaminophen (NORCO) 10-325 MG per tablet Take 1 tablet by mouth every 6 (six) hours as needed for moderate pain or severe pain.    Yes Historical Provider, MD  mirtazapine (REMERON) 15 MG tablet Take 15 mg by mouth at bedtime.   Yes Historical Provider, MD  omeprazole (PRILOSEC) 10 MG capsule Take 10 mg by mouth daily.   Yes Historical Provider, MD  omeprazole (PRILOSEC) 20 MG capsule Take 20 mg by mouth daily.   Yes Historical Provider, MD  pregabalin (LYRICA) 75 MG capsule Take 75-150 mg by mouth 2 (two) times daily. 150mg  in the morning and 75mg  in the evening   Yes Historical Provider, MD  tiZANidine (ZANAFLEX) 4 MG capsule Take 4 mg by mouth 3 (three) times daily as needed for muscle spasms.    Yes Historical Provider, MD  zolpidem (AMBIEN) 10 MG tablet Take 10 mg by mouth at bedtime.    Yes Historical Provider, MD   Physical Exam: Filed Vitals:   10/17/15 1800 10/17/15 1931  BP: 115/92 108/75  Pulse: 94   Temp:    Resp: 19 13    BP 108/75 mmHg  Pulse 94  Temp(Src) 100.1 F (37.8 C) (Oral)  Resp 13  SpO2 100%  General Appearance:    Alert, oriented, no distress, appears stated age  Head:    Normocephalic, atraumatic  Eyes:    PERRL, EOMI, sclera non-icteric        Nose:   Nares without drainage or epistaxis. Mucosa, turbinates normal  Throat:   Moist mucous membranes. Oropharynx without erythema or exudate.  Neck:   Supple. No carotid bruits.  No thyromegaly.  No lymphadenopathy.   Back:     No CVA tenderness, no spinal tenderness  Lungs:     Clear to auscultation bilaterally, without wheezes, rhonchi or rales  Chest wall:    No tenderness to palpitation  Heart:    Regular rate and rhythm without murmurs, gallops, rubs  Abdomen:     Soft, non-tender, nondistended, normal bowel sounds, no organomegaly  Genitalia:    deferred  Rectal:    deferred  Extremities:   No  clubbing, cyanosis or edema.  Pulses:   2+ and symmetric all extremities  Skin:   Skin color, texture, turgor normal, no rashes or lesions  Lymph nodes:   Cervical, supraclavicular, and axillary nodes normal  Neurologic:   CNII-XII intact. Normal strength, sensation and reflexes      throughout    Labs on Admission:  Basic Metabolic Panel:  Recent Labs Lab 10/17/15 1606 10/17/15 1610  NA 140 143  K 3.0* 2.9*  CL 109 108  CO2 19*  --   GLUCOSE 125* 122*  BUN 8 11  CREATININE 1.17* 1.10*  CALCIUM 9.2  --    Liver Function Tests:  Recent Labs Lab 10/17/15 1606  AST 40  ALT 27  ALKPHOS 82  BILITOT 0.5  PROT 7.9  ALBUMIN 4.1   No results for input(s): LIPASE, AMYLASE in the last 168 hours. No results for input(s): AMMONIA in the last 168 hours. CBC:  Recent Labs Lab 10/17/15 1606 10/17/15 1610  WBC 6.1  --   NEUTROABS 1.8  --   HGB 12.3 15.3*  HCT 39.4 45.0  MCV 92.1  --   PLT 208  --    Cardiac Enzymes: No results for input(s): CKTOTAL, CKMB, CKMBINDEX, TROPONINI in the last 168 hours.  BNP (last 3 results) No results for input(s): PROBNP in the last 8760 hours. CBG:  Recent Labs Lab 10/17/15 1615  GLUCAP 108*    Radiological Exams on Admission: Ct Head Wo Contrast  10/17/2015  ADDENDUM REPORT: 10/17/2015 16:40 ADDENDUM: Study discussed by telephone with Dr. Wallie Char on 10/17/2015 at 1624 hours. Electronically Signed   By: Genevie Ann M.D.   On: 10/17/2015 16:40  10/17/2015  CLINICAL DATA:  64 year old female code stroke. Right side weakness and abnormal speech. Last seen normal 1510 hours. Initial encounter. EXAM: CT HEAD WITHOUT CONTRAST TECHNIQUE: Contiguous axial images were obtained from the base of the skull through the vertex without intravenous contrast. COMPARISON:  None. FINDINGS: Visualized paranasal sinuses and mastoids are clear. No acute osseous abnormality identified. Visualized orbits and scalp soft tissues are within normal limits.  Calcified atherosclerosis at the skull base.  Dural calcifications. Asymmetric hyperdensity at the left ICA terminus on series 201, image 7. No cortically based acute infarct identified. No acute intracranial hemorrhage identified. No midline shift, mass effect, or evidence of intracranial mass lesion. Gray-white matter differentiation is within normal limits throughout the brain. IMPRESSION: Suspicious asymmetric hyperdensity at the left ICA terminus, but otherwise normal for age noncontrast CT appearance of the brain. Electronically Signed: By: Genevie Ann M.D. On: 10/17/2015 16:20  Mr Brain Wo Contrast  10/17/2015  CLINICAL DATA:  Code stroke status with new onset of right-sided weakness. EXAM: MRI HEAD WITHOUT CONTRAST TECHNIQUE: Multiplanar, multiecho pulse sequences of the brain and surrounding structures were obtained without intravenous contrast. COMPARISON:  Head CT same day FINDINGS: No abnormality is seen affecting the brainstem. No acute infarction affecting the cerebellum. I think there are a few old small vessel infarctions within the cerebellum. Cerebral hemispheres show background pattern of a few small foci of T2 and FLAIR signal in the white matter suggesting mild small vessel disease. There are scattered punctate acute infarctions in both hemispheres, much more extensive on the left than the right, largely in a watershed distribution. These could represent a shower of micro embolic emboli or could actually relate to watershed infarction. No large confluent infarction. No mass effect or gross hemorrhage. No hydrocephalus. No extra-axial collection. No pituitary mass. No inflammatory sinus disease. No skull or skullbase lesion. Major vessels at the base of the brain show flow. IMPRESSION: Innumerable punctate infarctions affecting both cerebral hemispheres, much more extensive on the left than the right, which could be due to a shower of micro emboli or watershed infarctions. No large confluent  infarction. No mass effect or hemorrhage. Electronically Signed   By: Nelson Chimes M.D.   On: 10/17/2015 19:09    EKG: Independently reviewed.  Assessment/Plan Principal Problem:   Embolic stroke (HCC) Active Problems:   HTN (hypertension)   1. Embolic stroke - likely cardiac source with bilateral findings on MRI 1. Stroke work up pathway 2. CTA being done now 3. Needs TTE, if TTE is negative, then will require TEE 4. BCx pending given temperature of 100.1 in ED 5. DDx includes cholesterol, thrombotic, and septic cardiac sources of the emboli.  Obviously cannot yet determine what medicine (ABx, blood thinners, etc) to put patient on until this is determined. 2. HTN - Permissive HTN, holding BP meds 3. Chronic back pain - continue home meds    Code Status: Full Code  Family Communication: Family at bedside, patient initially reluctant to stay, had extensive discussion about importance of this, family confirms that mentally she is at baseline status however.  Hopefully she will stay to complete work up and not leave AMA. Disposition Plan: Admit to inpatient   Time spent: 70 min  Dalinda Heidt M. [facility] Pager [phone]  If 7AM-7PM, please contact the day team taking care of the patient [email or web address] Password [email or web address] 10/17/2015, 8:43 PM

## 2015-10-17 NOTE — ED Notes (Signed)
Pt back from MRI. Ambulated to bathroom. No distress noted.

## 2015-10-17 NOTE — ED Notes (Signed)
Patient transported to MRI 

## 2015-10-17 NOTE — Progress Notes (Signed)
Pt arrived to unit alert and oriented. Oriented to room and questions answered. Call bell at side. Bed alarm activated. Will continue to monitor. Bobbye Charleston, RN

## 2015-10-18 ENCOUNTER — Encounter (HOSPITAL_COMMUNITY): Payer: Self-pay | Admitting: *Deleted

## 2015-10-18 ENCOUNTER — Ambulatory Visit (HOSPITAL_BASED_OUTPATIENT_CLINIC_OR_DEPARTMENT_OTHER): Payer: Medicare Other

## 2015-10-18 ENCOUNTER — Encounter (HOSPITAL_COMMUNITY)
Admission: EM | Disposition: A | Discharge status: Home Health | Payer: Self-pay | Source: Home / Self Care | Attending: Internal Medicine

## 2015-10-18 ENCOUNTER — Inpatient Hospital Stay (HOSPITAL_COMMUNITY): Payer: Medicare Other

## 2015-10-18 DIAGNOSIS — I639 Cerebral infarction, unspecified: Secondary | ICD-10-CM

## 2015-10-18 DIAGNOSIS — I517 Cardiomegaly: Secondary | ICD-10-CM

## 2015-10-18 DIAGNOSIS — F17200 Nicotine dependence, unspecified, uncomplicated: Secondary | ICD-10-CM

## 2015-10-18 DIAGNOSIS — D151 Benign neoplasm of heart: Secondary | ICD-10-CM | POA: Diagnosis present

## 2015-10-18 DIAGNOSIS — I10 Essential (primary) hypertension: Secondary | ICD-10-CM | POA: Insufficient documentation

## 2015-10-18 DIAGNOSIS — I6789 Other cerebrovascular disease: Secondary | ICD-10-CM | POA: Diagnosis not present

## 2015-10-18 DIAGNOSIS — E785 Hyperlipidemia, unspecified: Secondary | ICD-10-CM | POA: Insufficient documentation

## 2015-10-18 DIAGNOSIS — I63413 Cerebral infarction due to embolism of bilateral middle cerebral arteries: Principal | ICD-10-CM

## 2015-10-18 DIAGNOSIS — I340 Nonrheumatic mitral (valve) insufficiency: Secondary | ICD-10-CM

## 2015-10-18 HISTORY — PX: TEE WITHOUT CARDIOVERSION: SHX5443

## 2015-10-18 LAB — LIPID PANEL
CHOL/HDL RATIO: 3.4 ratio
CHOLESTEROL: 141 mg/dL (ref 0–200)
HDL: 42 mg/dL (ref >40)
LDL Cholesterol: 83 mg/dL (ref 0–99)
Triglycerides: 78 mg/dL (ref <150)
VLDL: 16 mg/dL (ref 0–40)

## 2015-10-18 LAB — ANTITHROMBIN III: ANTITHROMB III FUNC: 83 % (ref 75–120)

## 2015-10-18 LAB — C-REACTIVE PROTEIN: CRP: <0.5 mg/dL (ref <1.0)

## 2015-10-18 LAB — SEDIMENTATION RATE: SED RATE: 16 mm/h (ref 0–22)

## 2015-10-18 SURGERY — ECHOCARDIOGRAM, TRANSESOPHAGEAL
Anesthesia: Moderate Sedation

## 2015-10-18 SURGERY — LOOP RECORDER INSERTION
Anesthesia: LOCAL

## 2015-10-18 MED ORDER — ATORVASTATIN CALCIUM 10 MG PO TABS
20.0000 mg | ORAL_TABLET | Freq: Every day | ORAL | Status: DC
  Administered 2015-10-19 – 2015-10-23 (×5): 20 mg via ORAL
  Filled 2015-10-18 (×7): qty 2

## 2015-10-18 MED ORDER — SODIUM CHLORIDE 0.9 % IV SOLN
INTRAVENOUS | Status: DC
  Administered 2015-10-18: 04:00:00 via INTRAVENOUS

## 2015-10-18 MED ORDER — HEPARIN (PORCINE) IN NACL 100-0.45 UNIT/ML-% IJ SOLN
800.0000 [IU]/h | INTRAMUSCULAR | Status: DC
  Administered 2015-10-18 – 2015-10-22 (×3): 800 [IU]/h via INTRAVENOUS
  Filled 2015-10-18 (×5): qty 250

## 2015-10-18 MED ORDER — MIDAZOLAM HCL 5 MG/ML IJ SOLN
INTRAMUSCULAR | Status: AC
  Filled 2015-10-18: qty 2

## 2015-10-18 MED ORDER — SODIUM CHLORIDE 0.9 % IV SOLN
INTRAVENOUS | Status: DC
Start: 2015-10-18 — End: 2015-10-18

## 2015-10-18 MED ORDER — FENTANYL CITRATE (PF) 100 MCG/2ML IJ SOLN
INTRAMUSCULAR | Status: AC
  Filled 2015-10-18: qty 2

## 2015-10-18 MED ORDER — MIDAZOLAM HCL 10 MG/2ML IJ SOLN
INTRAMUSCULAR | Status: DC | PRN
  Administered 2015-10-18 (×2): 2 mg via INTRAVENOUS

## 2015-10-18 MED ORDER — SODIUM CHLORIDE 0.9 % IV BOLUS (SEPSIS)
1000.0000 mL | Freq: Once | INTRAVENOUS | Status: AC
  Administered 2015-10-18: 1000 mL via INTRAVENOUS

## 2015-10-18 MED ORDER — BUTAMBEN-TETRACAINE-BENZOCAINE 2-2-14 % EX AERO
INHALATION_SPRAY | CUTANEOUS | Status: DC | PRN
  Administered 2015-10-18: 2 via TOPICAL

## 2015-10-18 MED ORDER — DIPHENHYDRAMINE HCL 50 MG/ML IJ SOLN
INTRAMUSCULAR | Status: AC
  Filled 2015-10-18: qty 1

## 2015-10-18 MED ORDER — ASPIRIN 325 MG PO TABS
325.0000 mg | ORAL_TABLET | Freq: Every day | ORAL | Status: DC
  Administered 2015-10-18: 325 mg via ORAL
  Filled 2015-10-18: qty 1

## 2015-10-18 MED ORDER — FENTANYL CITRATE (PF) 100 MCG/2ML IJ SOLN
INTRAMUSCULAR | Status: DC | PRN
  Administered 2015-10-18: 25 ug via INTRAVENOUS

## 2015-10-18 NOTE — Progress Notes (Signed)
*  Preliminary Results* Bilateral lower extremity venous duplex completed. Study was technically limited and difficult due to poor patient cooperation and patient body habitus. Visualized veins of bilateral lower extremities are negative for deep vein thrombosis. There is no evidence of Baker's cyst bilaterally.  10/18/2015  Maudry Mayhew, RVT, RDCS, RDMS

## 2015-10-18 NOTE — Progress Notes (Signed)
STROKE TEAM PROGRESS NOTE   HISTORY Julia Schwartz is an 64 y.o. female with a history of hypertension, anxiety, depression and fibromyalgia, brought to the emergency room and code stroke status with a complaint of new onset right-sided weakness. She has no previous history of stroke nor TIA. She has not been on antiplatelet therapy. CT scan of the head showed no acute findings. Hyperdensity was noted involving the left ICA terminus. Significance is unclear. Patient's exam showed no clear objective deficits. She was also emotionally labile. Code stroke was subsequently canceled. Patient was not considered for TPA secondary to no neurologic deficit. She was admitted for further evaluation and treatment.   SUBJECTIVE (INTERVAL HISTORY) Her daughter is at the bedside.  Overall she feels her condition is stable. Patient lethargic from this am. Will do TEE today. Still has right hemiparesis and right facial droop.   OBJECTIVE Temp:  [98.2 F (36.8 C)-100.1 F (37.8 C)] 98.4 F (36.9 C) (12/30 1010) Pulse Rate:  [75-110] 78 (12/30 1010) Cardiac Rhythm:  [-] Normal sinus rhythm (12/30 0700) Resp:  [13-22] 18 (12/30 1010) BP: (74-153)/(52-92) 153/88 mmHg (12/30 1010) SpO2:  [96 %-100 %] 100 % (12/30 1010) Weight:  [74.254 kg (163 lb 11.2 oz)] 74.254 kg (163 lb 11.2 oz) (12/29 2153)  CBC:   Recent Labs Lab 10/17/15 1606 10/17/15 1610  WBC 6.1  --   NEUTROABS 1.8  --   HGB 12.3 15.3*  HCT 39.4 45.0  MCV 92.1  --   PLT 208  --     Basic Metabolic Panel:   Recent Labs Lab 10/17/15 1606 10/17/15 1610  NA 140 143  K 3.0* 2.9*  CL 109 108  CO2 19*  --   GLUCOSE 125* 122*  BUN 8 11  CREATININE 1.17* 1.10*  CALCIUM 9.2  --     Lipid Panel:     Component Value Date/Time   CHOL 141 10/18/2015 0539   TRIG 78 10/18/2015 0539   HDL 42 10/18/2015 0539   CHOLHDL 3.4 10/18/2015 0539   VLDL 16 10/18/2015 0539   LDLCALC 83 10/18/2015 0539   HgbA1c: No results found for:  HGBA1C Urine Drug Screen:     Component Value Date/Time   LABOPIA POSITIVE* 10/17/2015 1810   COCAINSCRNUR NONE DETECTED 10/17/2015 1810   LABBENZ NONE DETECTED 10/17/2015 1810   AMPHETMU NONE DETECTED 10/17/2015 1810   THCU NONE DETECTED 10/17/2015 1810   LABBARB NONE DETECTED 10/17/2015 1810      IMAGING I have personally reviewed the radiological images below and agree with the radiology interpretations.  Ct Head Wo Contrast 10/17/2015   Suspicious asymmetric hyperdensity at the left ICA terminus, but otherwise normal for age noncontrast CT appearance of the brain.   CT HEAD 10/17/2015   : No abnormal intracranial enhancement.   CTA NECK 10/17/2015   : No acute vascular process or hemodynamically significant stenosis.   CTA HEAD 10/17/2015   : No acute large vessel occlusion or high-grade stenosis. Complete circle of Willis.   Mr Brain Wo Contrast 10/17/2015   Innumerable punctate infarctions affecting both cerebral hemispheres, much more extensive on the left than the right, which could be due to a shower of micro emboli or watershed infarctions. No large confluent infarction. No mass effect or hemorrhage.   Bilateral lower extremity venous duplex  Visualized veins of bilateral lower extremities are negative for deep vein thrombosis. There is no evidence of Baker's cyst bilaterally.  2D Echocardiogram  - Left ventricle: The cavity  size was normal. There was mild focalbasal hypertrophy of the septum. Systolic function was normal.The estimated ejection fraction was 55%. Wall motion was normal;there were no regional wall motion abnormalities. - Atrial septum: No defect or patent foramen ovale was identified. - Impressions:  No cardiac source of emboli was indentified.  TEE - There is a large mobile echodensity attached to the supero-anterior portion of the interatrial septum from the left atrial side. The echodensity measures 27 x 29 mm and has one largest lobe with  multiple smaller thin mobile parts. The mass appears myxomatous. It is not obstructing flow.  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 A cardiac MRI is recommended for further evaluation.   MRI heart - pending   PHYSICAL EXAM  Temp:  [98.2 F (36.8 C)-99 F (37.2 C)] 98.4 F (36.9 C) (12/30 1723) Pulse Rate:  [75-110] 82 (12/30 1723) Resp:  [13-22] 18 (12/30 1723) BP: (74-153)/(52-92) 116/73 mmHg (12/30 1723) SpO2:  [98 %-100 %] 100 % (12/30 1723) Weight:  [163 lb 11.2 oz (74.254 kg)] 163 lb 11.2 oz (74.254 kg) (12/29 2153)  General - Well nourished, well developed, in no apparent distress, but lethargic.  Ophthalmologic - Fundi not visualized due to noncooperation.  Cardiovascular - Regular rate and rhythm.  Mental Status -  Level of arousal and orientation to place, and person were intact, but not to time. On language exam, pt has significant perseveration, fluent in sentences, 1/4 naming, able to repeat, following simple commands.  Cranial Nerves II - XII - II - right upper quadrant quadranopia. III, IV, VI - Extraocular movements intact. V - Facial sensation intact bilaterally. VII - right facial droop. VIII - Hearing & vestibular intact bilaterally. X - Palate elevates symmetrically. XI - Chin turning & shoulder shrug intact bilaterally XII - Tongue protrusion intact.  Motor Strength - The patient's strength was right UE proximal 4/5 and distal 0/5 with pronator drift, RLE proximal 5-/5 and distal 4-/5. LLE and LUE 5/5. Bulk was normal and fasciculations were absent.   Motor Tone - Muscle tone was assessed at the neck and appendages and was normal.  Reflexes - The patient's reflexes were 1+ in all extremities and she had no pathological reflexes.  Sensory - Light touch, temperature/pinprick were assessed and were symmetrical.    Coordination - The patient had normal movements in the hands with no ataxia or dysmetria, but  slow on the right.  Tremor was absent.  Gait and Station - not tested.   ASSESSMENT/PLAN Ms. Julia Schwartz is a 64 y.o. female with history of hypertension, anxiety, depression and fibromyalgia presenting with right-sided weakness. She did not receive IV t-PA due to no observable deficits.   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   Heparin iv for VTE prophylaxis Diet NPO time specified  No antithrombotic prior to admission, now on aspirin 325 mg daily. Recommend heparin drip.  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  Hypertension  Runs on the lower side up until 1010 this am 74/52, 84/58 - this am 153/88 Permissive hypertension (OK if < 220/120) but gradually normalize in 5-7 days  Hyperlipidemia  Home meds:  No statin  LDL 83, goal < 70  Now on lipitor 20  Continue statin at discharge  Tobacco abuse  Current smoker  Smoking cessation counseling provided  Pt is willing to quit  Other Stroke Risk Factors  Obesity, Body mass index is 30.95 kg/(m^2).   Other Active Problems  Fibromyalgia   Depression/anxiety  GERD   chronic back  pain  Hospital day # 1  Rosalin Hawking, MD PhD Stroke Neurology 10/18/2015 5:46 PM   To contact Stroke Continuity provider, please refer to [email or web address] After hours, contact General Neurology

## 2015-10-18 NOTE — Progress Notes (Signed)
Utilization Review Completed.Julia Schwartz T12/30/2016  

## 2015-10-18 NOTE — Progress Notes (Signed)
PT Cancellation Note  Patient Details Name: Julia Schwartz MRN: [id number] DOB: [birth year]   Cancelled Treatment:    Reason Eval/Treat Not Completed: Patient at procedure or test/unavailable. Began taking patient's history and checking BP when transport arrived to take pt to test. Will attempt to see later today.   [name] 10/18/2015, 8:54 AM  Pager [phone]

## 2015-10-18 NOTE — Progress Notes (Signed)
  Echocardiogram 2D Echocardiogram has been performed.  Darlina Sicilian M 10/18/2015, 9:49 AM

## 2015-10-18 NOTE — H&P (View-Only) (Signed)
Called regarding MRI results. She is outside of the IV tpa window at this point but from the description of Dr. Nicole Kindred, it sounds like symptoms would not have merited tpa in any case.  She will need further evaluation regarding the source of her embolis, likely cardiac or aortic source.   1) CTA head and neck 2) Echocardiogram 3) a1c, LDL 4) frequent neuro checks 5) permissive htn 6) NPO pending bedside nursing swallow screen.  7) PT, OT, speech evals if needed 8) stroke team to follow.   Roland Rack, MD [facility] [phone]  If 7pm- 7am, please page neurology on call as listed in [place].

## 2015-10-18 NOTE — Interval H&P Note (Signed)
History and Physical Interval Note:  10/18/2015 12:37 PM  Julia Schwartz  has presented today for surgery, with the diagnosis of CVA  The various methods of treatment have been discussed with the patient and family. After consideration of risks, benefits and other options for treatment, the patient has consented to  Procedure(s): TRANSESOPHAGEAL ECHOCARDIOGRAM (TEE) (N/A) as a surgical intervention .  The patient's history has been reviewed, patient examined, no change in status, stable for surgery.  I have reviewed the patient's chart and labs.  Questions were answered to the patient's satisfaction.     Seanne Spark

## 2015-10-18 NOTE — Progress Notes (Addendum)
PATIENT DETAILS Name: Julia Schwartz Age: 64 y.o. Sex: female Date of Birth: 1951-07-17 Admit Date: 10/17/2015 Admitting Physician Etta Quill, DO PCP:No primary care provider on file.  Subjective: Slightly lethargic-but with slightly dysarthric and slow speech. Continues to have right-sided deficits.  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 Dr. Wyline Beady a heparin infusion. Continues to have significant right-sided deficits, await PT/OT evaluation  Active Problems: Hypokalemia: Replete and recheck.  HTN (hypertension): Allow mild permissive hypertension setting of acute CVA-continue to hold antihypertensives for now.  GERD: Continue PPI  Anxiety/depression: appears stable-continue with BuSpar, Cymbalta, Remeron  Disposition: Remain inpatient  Antimicrobial agents  See below  Anti-infectives    None      DVT Prophylaxis: Heparin gtt  Code Status: Full code   Family Communication Daughter at bedside  Procedures: TEE 12/30  CONSULTS:  cardiology and neurology  Time spent 30 minutes-Greater than 50% of this time was spent in counseling, explanation of diagnosis, planning of further management, and coordination of care.  MEDICATIONS: Scheduled Meds: . [MAR Hold] aspirin  325 mg Oral Daily  . [MAR Hold] atorvastatin  20 mg Oral q1800  . [MAR Hold] busPIRone  15 mg Oral BID  . [MAR Hold] diclofenac  75 mg Oral BID  . [MAR Hold] DULoxetine  60 mg Oral Daily  . [MAR Hold] heparin  5,000 Units Subcutaneous 3 times per day  . [MAR Hold] mirtazapine  15 mg Oral QHS  . [MAR Hold] pantoprazole  80 mg Oral Daily  . [MAR Hold] pregabalin  150 mg Oral Daily  . [MAR Hold] pregabalin  75 mg Oral QHS  . [MAR Hold] zolpidem  10 mg Oral QHS   Continuous Infusions: . sodium chloride 100 mL/hr at 10/18/15 0800    . sodium chloride     PRN Meds:.[MAR Hold] HYDROcodone-acetaminophen, [MAR Hold] tiZANidine    PHYSICAL EXAM: Vital signs in last 24 hours: Filed Vitals:   10/18/15 1355 10/18/15 1400 10/18/15 1405 10/18/15 1410  BP: 115/62 110/55  108/59  Pulse: 85 79 76 79  Temp:      TempSrc:      Resp: 18 16 16 15   Height:      Weight:      SpO2: 100% 100% 100% 98%    Weight change:  Filed Weights   10/17/15 2153  Weight: 74.254 kg (163 lb 11.2 oz)   Body mass index is 30.95 kg/(m^2).   Gen Exam: Awake-slight dysarthria-slow speech  Neck: Supple, No JVD.   Chest: B/L Clear.   CVS: S1 S2 Regular, no murmurs.  Abdomen: soft, BS +, non tender, non distended.  Extremities: no edema, lower extremities warm to touch. Neurologic: RUE/RLE 3+- 4/5   Skin: No Rash.   Wounds: N/A.    Intake/Output from previous day:  Intake/Output Summary (Last 24 hours) at 10/18/15 1418 Last data filed at 10/18/15 0800  Gross per 24 hour  Intake    700 ml  Output      0 ml  Net    700 ml     LAB RESULTS: CBC  Recent Labs Lab 10/17/15 1606 10/17/15 1610  WBC 6.1  --   HGB 12.3 15.3*  HCT 39.4 45.0  PLT 208  --   MCV 92.1  --   MCH 28.7  --  MCHC 31.2  --   RDW 14.0  --   LYMPHSABS 3.3  --   MONOABS 0.7  --   EOSABS 0.2  --   BASOSABS 0.0  --     Chemistries   Recent Labs Lab 10/17/15 1606 10/17/15 1610  NA 140 143  K 3.0* 2.9*  CL 109 108  CO2 19*  --   GLUCOSE 125* 122*  BUN 8 11  CREATININE 1.17* 1.10*  CALCIUM 9.2  --     CBG:  Recent Labs Lab 10/17/15 1615  GLUCAP 108*    GFR Estimated Creatinine Clearance: 48.3 mL/min (by C-G formula based on Cr of 1.1).  Coagulation profile  Recent Labs Lab 10/17/15 1606  INR 1.08    Cardiac Enzymes No results for input(s): CKMB, TROPONINI, MYOGLOBIN in the last 168 hours.  Invalid input(s): CK  Invalid input(s): POCBNP No results for input(s): DDIMER in the last 72 hours. No results for input(s): HGBA1C  in the last 72 hours.  Recent Labs  10/18/15 0539  CHOL 141  HDL 42  LDLCALC 83  TRIG 78  CHOLHDL 3.4   No results for input(s): TSH, T4TOTAL, T3FREE, THYROIDAB in the last 72 hours.  Invalid input(s): FREET3 No results for input(s): VITAMINB12, FOLATE, FERRITIN, TIBC, IRON, RETICCTPCT in the last 72 hours. No results for input(s): LIPASE, AMYLASE in the last 72 hours.  Urine Studies No results for input(s): UHGB, CRYS in the last 72 hours.  Invalid input(s): UACOL, UAPR, USPG, UPH, UTP, UGL, UKET, UBIL, UNIT, UROB, ULEU, UEPI, UWBC, URBC, UBAC, CAST, UCOM, BILUA  MICROBIOLOGY: No results found for this or any previous visit (from the past 240 hour(s)).  RADIOLOGY STUDIES/RESULTS: Ct Angio Head W/cm &/or Wo Cm  10/17/2015  CLINICAL DATA:  Followup stroke symptoms.  History of hypertension. EXAM: CT ANGIOGRAPHY HEAD AND NECK TECHNIQUE: Multidetector CT imaging of the head and neck was performed using the standard protocol during bolus administration of intravenous contrast. Multiplanar CT image reconstructions and MIPs were obtained to evaluate the vascular anatomy. Carotid stenosis measurements (when applicable) are obtained utilizing NASCET criteria, using the distal internal carotid diameter as the denominator. CONTRAST:  168mL OMNIPAQUE IOHEXOL 350 MG/ML SOLN COMPARISON:  MRI of the brain October 17, 2015 at 1838 hours. FINDINGS: CTA NECK Aortic arch: Normal appearance of the thoracic arch, normal branch pattern. The origins of the innominate, left Common carotid artery and subclavian artery are widely patent. Right carotid system: Common carotid artery is widely patent, coursing in a straight line fashion. Normal appearance of the carotid bifurcation without hemodynamically significant stenosis by NASCET criteria. Minimal eccentric calcific atherosclerosis and intimal thickening. Normal appearance of the included internal carotid artery. Left carotid system: Common carotid artery is  widely patent, coursing in a straight line fashion. Normal appearance of the carotid bifurcation without hemodynamically significant stenosis by NASCET criteria. Minimal eccentric calcific atherosclerosis and intimal thickening. Normal appearance of the included internal carotid artery. Vertebral arteries:Left vertebral artery is dominant. Normal appearance of the vertebral arteries, which appear widely patent. Skeleton: No acute osseous process though bone windows have not been submitted. Patient is edentulous. 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 No destructive bony lesions. Other neck: Soft tissues of the neck are nonacute though, not tailored for evaluation. Included heart appears mildly enlarged. Prominent main pulmonary artery, incompletely imaged. Calcified hilar lymph nodes. Atrophic versus resected LEFT submandibular gland. Centrally edematous RIGHT submandibular gland without sialolith or inflammatory changes. CTA HEAD Anterior circulation: Patent cervical internal carotid  arteries, petrous, cavernous and supra clinoid internal carotid arteries. Moderate calcific atherosclerosis of the carotid siphons. Widely patent anterior communicating artery. Normal appearance of the anterior and middle cerebral arteries. Supernumerary anterior cerebral artery arising from LEFT A1-2 junction. Posterior circulation: Normal appearance of the vertebral arteries, vertebrobasilar junction and basilar artery, as well as main branch vessels. Bilateral posterior communicating arteries contribute to posterior circulation. Normal appearance of the posterior cerebral arteries. No large vessel occlusion, hemodynamically significant stenosis, dissection, luminal irregularity, contrast extravasation or aneurysm within the anterior nor posterior circulation. No abnormal intracranial enhancement on delayed phase. IMPRESSION: CT HEAD: No abnormal intracranial enhancement. CTA NECK: No acute vascular process or  hemodynamically significant stenosis. CTA HEAD: No acute large vessel occlusion or high-grade stenosis. Complete circle of Willis. Electronically Signed   By: Elon Alas M.D.   On: 10/17/2015 21:46   Ct Head Wo Contrast  10/17/2015  ADDENDUM REPORT: 10/17/2015 16:40 ADDENDUM: Study discussed by telephone with Dr. Wallie Char on 10/17/2015 at 1624 hours. Electronically Signed   By: Genevie Ann M.D.   On: 10/17/2015 16:40  10/17/2015  CLINICAL DATA:  64 year old female code stroke. Right side weakness and abnormal speech. Last seen normal 1510 hours. Initial encounter. EXAM: CT HEAD WITHOUT CONTRAST TECHNIQUE: Contiguous axial images were obtained from the base of the skull through the vertex without intravenous contrast. COMPARISON:  None. FINDINGS: Visualized paranasal sinuses and mastoids are clear. No acute osseous abnormality identified. Visualized orbits and scalp soft tissues are within normal limits. Calcified atherosclerosis at the skull base.  Dural calcifications. Asymmetric hyperdensity at the left ICA terminus on series 201, image 7. No cortically based acute infarct identified. No acute intracranial hemorrhage identified. No midline shift, mass effect, or evidence of intracranial mass lesion. Gray-white matter differentiation is within normal limits throughout the brain. IMPRESSION: Suspicious asymmetric hyperdensity at the left ICA terminus, but otherwise normal for age noncontrast CT appearance of the brain. Electronically Signed: By: Genevie Ann M.D. On: 10/17/2015 16:20   Ct Angio Neck W/cm &/or Wo/cm  10/17/2015  CLINICAL DATA:  Followup stroke symptoms.  History of hypertension. EXAM: CT ANGIOGRAPHY HEAD AND NECK TECHNIQUE: Multidetector CT imaging of the head and neck was performed using the standard protocol during bolus administration of intravenous contrast. Multiplanar CT image reconstructions and MIPs were obtained to evaluate the vascular anatomy. Carotid stenosis measurements  (when applicable) are obtained utilizing NASCET criteria, using the distal internal carotid diameter as the denominator. CONTRAST:  139mL OMNIPAQUE IOHEXOL 350 MG/ML SOLN COMPARISON:  MRI of the brain October 17, 2015 at 1838 hours. FINDINGS: CTA NECK Aortic arch: Normal appearance of the thoracic arch, normal branch pattern. The origins of the innominate, left Common carotid artery and subclavian artery are widely patent. Right carotid system: Common carotid artery is widely patent, coursing in a straight line fashion. Normal appearance of the carotid bifurcation without hemodynamically significant stenosis by NASCET criteria. Minimal eccentric calcific atherosclerosis and intimal thickening. Normal appearance of the included internal carotid artery. Left carotid system: Common carotid artery is widely patent, coursing in a straight line fashion. Normal appearance of the carotid bifurcation without hemodynamically significant stenosis by NASCET criteria. Minimal eccentric calcific atherosclerosis and intimal thickening. Normal appearance of the included internal carotid artery. Vertebral arteries:Left vertebral artery is dominant. Normal appearance of the vertebral arteries, which appear widely patent. Skeleton: No acute osseous process though bone windows have not been submitted. Patient is edentulous. 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 No destructive bony lesions. Other neck:  Soft tissues of the neck are nonacute though, not tailored for evaluation. Included heart appears mildly enlarged. Prominent main pulmonary artery, incompletely imaged. Calcified hilar lymph nodes. Atrophic versus resected LEFT submandibular gland. Centrally edematous RIGHT submandibular gland without sialolith or inflammatory changes. CTA HEAD Anterior circulation: Patent cervical internal carotid arteries, petrous, cavernous and supra clinoid internal carotid arteries. Moderate calcific atherosclerosis of the carotid  siphons. Widely patent anterior communicating artery. Normal appearance of the anterior and middle cerebral arteries. Supernumerary anterior cerebral artery arising from LEFT A1-2 junction. Posterior circulation: Normal appearance of the vertebral arteries, vertebrobasilar junction and basilar artery, as well as main branch vessels. Bilateral posterior communicating arteries contribute to posterior circulation. Normal appearance of the posterior cerebral arteries. No large vessel occlusion, hemodynamically significant stenosis, dissection, luminal irregularity, contrast extravasation or aneurysm within the anterior nor posterior circulation. No abnormal intracranial enhancement on delayed phase. IMPRESSION: CT HEAD: No abnormal intracranial enhancement. CTA NECK: No acute vascular process or hemodynamically significant stenosis. CTA HEAD: No acute large vessel occlusion or high-grade stenosis. Complete circle of Willis. Electronically Signed   By: Elon Alas M.D.   On: 10/17/2015 21:46   Mr Brain Wo Contrast  10/17/2015  CLINICAL DATA:  Code stroke status with new onset of right-sided weakness. EXAM: MRI HEAD WITHOUT CONTRAST TECHNIQUE: Multiplanar, multiecho pulse sequences of the brain and surrounding structures were obtained without intravenous contrast. COMPARISON:  Head CT same day FINDINGS: No abnormality is seen affecting the brainstem. No acute infarction affecting the cerebellum. I think there are a few old small vessel infarctions within the cerebellum. Cerebral hemispheres show background pattern of a few small foci of T2 and FLAIR signal in the white matter suggesting mild small vessel disease. There are scattered punctate acute infarctions in both hemispheres, much more extensive on the left than the right, largely in a watershed distribution. These could represent a shower of micro embolic emboli or could actually relate to watershed infarction. No large confluent infarction. No mass effect  or gross hemorrhage. No hydrocephalus. No extra-axial collection. No pituitary mass. No inflammatory sinus disease. No skull or skullbase lesion. Major vessels at the base of the brain show flow. IMPRESSION: Innumerable punctate infarctions affecting both cerebral hemispheres, much more extensive on the left than the right, which could be due to a shower of micro emboli or watershed infarctions. No large confluent infarction. No mass effect or hemorrhage. Electronically Signed   By: Nelson Chimes M.D.   On: 10/17/2015 19:09    Oren Binet, MD  [facility] Pager:[phone]  If 7PM-7AM, please contact night-coverage [email or web address] Password [email or web address] 10/18/2015, 2:18 PM   LOS: 1 day

## 2015-10-18 NOTE — Consult Note (Addendum)
ELECTROPHYSIOLOGY CONSULT NOTE  Patient ID: Julia Schwartz MRN: [id number], DOB/AGE: 22-Aug-1951   Admit date: 10/17/2015 Date of Consult: 10/18/2015  Primary Physician: No primary care provider on file. Primary Cardiologist: new to [facility] Reason for Consultation: Cryptogenic stroke; recommendations regarding Implantable Loop Recorder  History of Present Illness Julia Schwartz was admitted on 10/17/2015 with new onset right sided weakness.  Imaging demonstrated bilateral punctate infarcts.  She has undergone workup for stroke including echocardiogram and carotid dopplers.  The patient has been monitored on telemetry which has demonstrated sinus rhythm with no arrhythmias.  Inpatient stroke work-up is to be completed with a TEE.   Echocardiogram this admission demonstrated EF 55%, no RWMA, LA 25.    Prior to admission, the patient denies chest pain, shortness of breath, dizziness, palpitations, or syncope.  They are recovering from their stroke with plans to return home at discharge.  EP has been asked to evaluate for placement of an implantable loop recorder to monitor for atrial fibrillation.   Past Medical History  Diagnosis Date  . Hypertension   . Anxiety   . Depression   . Arthritis   . GERD (gastroesophageal reflux disease)   . Chronic back pain   . Wears dentures     top-partial bottom  . Wears glasses   . Fibromyalgia      Surgical History:  Past Surgical History  Procedure Laterality Date  . Submandibular gland excision      left  . Abdominal hysterectomy    . Tonsillectomy    . Bunionectomy      both feet  . Fracture surgery      fx lt ankle     Prescriptions prior to admission  Medication Sig Dispense Refill Last Dose  . amLODipine-benazepril (LOTREL) 10-40 MG per capsule Take 1 capsule by mouth daily.   10/17/2015 at Unknown time  . busPIRone (BUSPAR) 15 MG tablet Take 15 mg by mouth 2 (two) times daily.    10/17/2015 at Unknown time  .  diclofenac (VOLTAREN) 75 MG EC tablet Take 75 mg by mouth 2 (two) times daily.   10/17/2015 at Unknown time  . DULoxetine (CYMBALTA) 60 MG capsule Take 60 mg by mouth daily.   10/17/2015 at Unknown time  . furosemide (LASIX) 20 MG tablet Take 20 mg by mouth daily.   10/17/2015 at Unknown time  . HYDROcodone-acetaminophen (NORCO) 10-325 MG per tablet Take 1 tablet by mouth every 6 (six) hours as needed for moderate pain or severe pain.    unknown  . mirtazapine (REMERON) 15 MG tablet Take 15 mg by mouth at bedtime.   10/16/2015 at Unknown time  . omeprazole (PRILOSEC) 10 MG capsule Take 10 mg by mouth daily.   10/17/2015 at Unknown time  . omeprazole (PRILOSEC) 20 MG capsule Take 20 mg by mouth daily.   10/17/2015 at Unknown time  . pregabalin (LYRICA) 75 MG capsule Take 75-150 mg by mouth 2 (two) times daily. 150mg  in the morning and 75mg  in the evening   10/17/2015 at Unknown time  . tiZANidine (ZANAFLEX) 4 MG capsule Take 4 mg by mouth 3 (three) times daily as needed for muscle spasms.    unknown  . zolpidem (AMBIEN) 10 MG tablet Take 10 mg by mouth at bedtime.    10/16/2015 at Unknown time    Inpatient Medications:  . aspirin  325 mg Oral Daily  . atorvastatin  20 mg Oral q1800  . busPIRone  15 mg Oral BID  .  diclofenac  75 mg Oral BID  . DULoxetine  60 mg Oral Daily  . heparin  5,000 Units Subcutaneous 3 times per day  . mirtazapine  15 mg Oral QHS  . pantoprazole  80 mg Oral Daily  . pregabalin  150 mg Oral Daily  . pregabalin  75 mg Oral QHS  . zolpidem  10 mg Oral QHS    Allergies:  Allergies  Allergen Reactions  . Ultram [Tramadol] Hives    Social History   Social History  . Marital Status: Single    Spouse Name: N/A  . Number of Children: N/A  . Years of Education: N/A   Occupational History  . Not on file.   Social History Main Topics  . Smoking status: Current Every Day Smoker -- 0.50 packs/day  . Smokeless tobacco: Not on file  . Alcohol Use: No  . Drug Use:  No  . Sexual Activity: Not on file   Other Topics Concern  . Not on file   Social History Narrative     Family History: no premature CAD   Review of Systems: All other systems reviewed and are otherwise negative except as noted above.  Physical Exam: Filed Vitals:   10/18/15 0221 10/18/15 0352 10/18/15 0548 10/18/15 1010  BP: 74/52 102/64 99/64 153/88  Pulse:  81 75 78  Temp:  98.2 F (36.8 C) 98.2 F (36.8 C) 98.4 F (36.9 C)  TempSrc:  Oral Oral Oral  Resp:  16 18 18   Height:      Weight:      SpO2:  98% 100% 100%    GEN- The patient is elderly appearing, very lethargic but arouses and answers questions appropriately   Head- normocephalic, atraumatic Eyes-  Sclera clear, conjunctiva pink Ears- hearing intact Oropharynx- clear Neck- supple Lungs- Clear to ausculation bilaterally, normal work of breathing Heart- Regular rate and rhythm, no murmurs, rubs or gallops  GI- soft, NT, ND, + BS Extremities- no clubbing, cyanosis, or edema MS- no significant deformity or atrophy Skin- no rash or lesion   Labs:   Lab Results  Component Value Date   WBC 6.1 10/17/2015   HGB 15.3* 10/17/2015   HCT 45.0 10/17/2015   MCV 92.1 10/17/2015   PLT 208 10/17/2015     Recent Labs Lab 10/17/15 1606 10/17/15 1610  NA 140 143  K 3.0* 2.9*  CL 109 108  CO2 19*  --   BUN 8 11  CREATININE 1.17* 1.10*  CALCIUM 9.2  --   PROT 7.9  --   BILITOT 0.5  --   ALKPHOS 82  --   ALT 27  --   AST 40  --   GLUCOSE 125* 122*     Radiology/Studies: Ct Angio Head W/cm &/or Wo Cm 10/17/2015  CLINICAL DATA:  Followup stroke symptoms.  History of hypertension. EXAM: CT ANGIOGRAPHY HEAD AND NECK TECHNIQUE: Multidetector CT imaging of the head and neck was performed using the standard protocol during bolus administration of intravenous contrast. Multiplanar CT image reconstructions and MIPs were obtained to evaluate the vascular anatomy. Carotid stenosis measurements (when applicable) are  obtained utilizing NASCET criteria, using the distal internal carotid diameter as the denominator. CONTRAST:  110mL OMNIPAQUE IOHEXOL 350 MG/ML SOLN COMPARISON:  MRI of the brain October 17, 2015 at 1838 hours. FINDINGS: CTA NECK Aortic arch: Normal appearance of the thoracic arch, normal branch pattern. The origins of the innominate, left Common carotid artery and subclavian artery are widely patent. Right  carotid system: Common carotid artery is widely patent, coursing in a straight line fashion. Normal appearance of the carotid bifurcation without hemodynamically significant stenosis by NASCET criteria. Minimal eccentric calcific atherosclerosis and intimal thickening. Normal appearance of the included internal carotid artery. Left carotid system: Common carotid artery is widely patent, coursing in a straight line fashion. Normal appearance of the carotid bifurcation without hemodynamically significant stenosis by NASCET criteria. Minimal eccentric calcific atherosclerosis and intimal thickening. Normal appearance of the included internal carotid artery. Vertebral arteries:Left vertebral artery is dominant. Normal appearance of the vertebral arteries, which appear widely patent. Skeleton: No acute osseous process though bone windows have not been submitted. Patient is edentulous. 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 No destructive bony lesions. Other neck: Soft tissues of the neck are nonacute though, not tailored for evaluation. Included heart appears mildly enlarged. Prominent main pulmonary artery, incompletely imaged. Calcified hilar lymph nodes. Atrophic versus resected LEFT submandibular gland. Centrally edematous RIGHT submandibular gland without sialolith or inflammatory changes. CTA HEAD Anterior circulation: Patent cervical internal carotid arteries, petrous, cavernous and supra clinoid internal carotid arteries. Moderate calcific atherosclerosis of the carotid siphons. Widely patent  anterior communicating artery. Normal appearance of the anterior and middle cerebral arteries. Supernumerary anterior cerebral artery arising from LEFT A1-2 junction. Posterior circulation: Normal appearance of the vertebral arteries, vertebrobasilar junction and basilar artery, as well as main branch vessels. Bilateral posterior communicating arteries contribute to posterior circulation. Normal appearance of the posterior cerebral arteries. No large vessel occlusion, hemodynamically significant stenosis, dissection, luminal irregularity, contrast extravasation or aneurysm within the anterior nor posterior circulation. No abnormal intracranial enhancement on delayed phase. IMPRESSION: CT HEAD: No abnormal intracranial enhancement. CTA NECK: No acute vascular process or hemodynamically significant stenosis. CTA HEAD: No acute large vessel occlusion or high-grade stenosis. Complete circle of Willis. Electronically Signed   By: Elon Alas M.D.   On: 10/17/2015 21:46   Mr Brain Wo Contrast 10/17/2015  CLINICAL DATA:  Code stroke status with new onset of right-sided weakness. EXAM: MRI HEAD WITHOUT CONTRAST TECHNIQUE: Multiplanar, multiecho pulse sequences of the brain and surrounding structures were obtained without intravenous contrast. COMPARISON:  Head CT same day FINDINGS: No abnormality is seen affecting the brainstem. No acute infarction affecting the cerebellum. I think there are a few old small vessel infarctions within the cerebellum. Cerebral hemispheres show background pattern of a few small foci of T2 and FLAIR signal in the white matter suggesting mild small vessel disease. There are scattered punctate acute infarctions in both hemispheres, much more extensive on the left than the right, largely in a watershed distribution. These could represent a shower of micro embolic emboli or could actually relate to watershed infarction. No large confluent infarction. No mass effect or gross hemorrhage. No  hydrocephalus. No extra-axial collection. No pituitary mass. No inflammatory sinus disease. No skull or skullbase lesion. Major vessels at the base of the brain show flow. IMPRESSION: Innumerable punctate infarctions affecting both cerebral hemispheres, much more extensive on the left than the right, which could be due to a shower of micro emboli or watershed infarctions. No large confluent infarction. No mass effect or hemorrhage. Electronically Signed   By: Nelson Chimes M.D.   On: 10/17/2015 19:09    12-lead ECG sinus tach, rate 103 All prior EKG's in EPIC reviewed with no documented atrial fibrillation  Telemetry sinus rhythm, sinus tach  Assessment and Plan:  1. Cryptogenic stroke The patient presents with cryptogenic stroke.  The patient has a TEE planned for  this AM.  I spoke at length with the patient about monitoring for afib with an implantable loop recorder.  Risks, benefits, and alteratives to TEE and implantable loop recorder were discussed with the patient and her daughter today.   At this time, the patient and her daughter are very clear in their decision to proceed with implantable loop recorder.   Wound care was reviewed with the patient (keep incision clean and dry for 3 days).     Please call with questions.   Patsey Berthold, NP 10/18/2015 12:50 PM  I have seen, examined the patient, and reviewed the above assessment and plan.  On exam, lethargic but rouses.  Changes to above are made where necessary.  Will proceed with ILR if TEE is without source for stroke.  Co Sign: Thompson Grayer, MD 10/18/2015 12:57 PM  Addendum:  TEE findings noted Pt with large LA mass.  Recs per Dr Meda Coffee for Cardiac MRI. No indication for implantable loop recorder given the above.  Electrophysiology team to see as needed while here. Please call with questions.  Thompson Grayer MD, [facility] 10/18/2015 2:54 PM

## 2015-10-18 NOTE — Progress Notes (Addendum)
Heparin drip delayed start due to Pt availability. Pt returned to unit at 1908 from MRI, infusion started. Pt and family educated on bleeding risk. Daughter at bedside. Instructed Pt and family to use call light for staff to assist/supervise while ambulating for safety precautions. Pt v/u. Daughter did not comment.

## 2015-10-18 NOTE — Progress Notes (Signed)
  Echocardiogram Echocardiogram Transesophageal has been performed.  Julia Schwartz 10/18/2015, 2:14 PM

## 2015-10-18 NOTE — CV Procedure (Signed)
     Transesophageal Echocardiogram Note  Julia Schwartz [id number] 1951/03/29  Procedure: Transesophageal Echocardiogram Indications: stroke  Procedure Details Consent: Obtained Time Out: Verified patient identification, verified procedure, site/side was marked, verified correct patient position, special equipment/implants available, Radiology Safety Procedures followed,  medications/allergies/relevent history reviewed, required imaging and test results available.  Performed  Medications: Fentanyl: 25 mcg Versed: 4 mg  There is a large mobile echodensity attached to the supero-anterior portion of the interatrial septum from the left atrial side. The echodensity measures 27 x 29 mm and has one largest lobe with multiple smaller thin mobile parts. The mass appears myxomatous. It is not obstructing flow.   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 A cardiac MRI is recommended for further evaluation.   Complications: No apparent complications Patient did tolerate procedure well.  Christabell Spark, MD, [facility] 10/18/2015, 12:38 PM

## 2015-10-18 NOTE — Progress Notes (Signed)
PT Cancellation Note  Patient Details Name: Julia Schwartz MRN: [id number] DOB: [birth year]   Cancelled Treatment:    Reason Eval/Treat Not Completed: Patient at procedure or test/unavailable. Have attempted several times to see pt and she has been off the floor. Will reattempt 12/31 as medically appropriate   [name] 10/18/2015, 3:37 PM  Pager [phone]

## 2015-10-18 NOTE — Progress Notes (Signed)
ANTICOAGULATION CONSULT NOTE - Initial Consult  Pharmacy Consult for Heparin Indication: stroke  Allergies  Allergen Reactions  . Ultram [Tramadol] Hives    Patient Measurements: Height: 5\' 1"  (154.9 cm) Weight: 163 lb 11.2 oz (74.254 kg) IBW/kg (Calculated) : 47.8 Heparin Dosing Weight: 64.1 kg  Vital Signs: Temp: 98.6 F (37 C) (12/30 1444) Temp Source: Oral (12/30 1444) BP: 108/67 mmHg (12/30 1444) Pulse Rate: 78 (12/30 1444)  Labs:  Recent Labs  10/17/15 1606 10/17/15 1610  HGB 12.3 15.3*  HCT 39.4 45.0  PLT 208  --   APTT 35  --   LABPROT 14.2  --   INR 1.08  --   CREATININE 1.17* 1.10*    Estimated Creatinine Clearance: 48.3 mL/min (by C-G formula based on Cr of 1.1).   Medical History: Past Medical History  Diagnosis Date  . Hypertension   . Anxiety   . Depression   . Arthritis   . GERD (gastroesophageal reflux disease)   . Chronic back pain   . Wears dentures     top-partial bottom  . Wears glasses   . Fibromyalgia     Medications:  Scheduled:  . [MAR Hold] atorvastatin  20 mg Oral q1800  . [MAR Hold] busPIRone  15 mg Oral BID  . [MAR Hold] DULoxetine  60 mg Oral Daily  . [MAR Hold] heparin  5,000 Units Subcutaneous 3 times per day  . [MAR Hold] pantoprazole  80 mg Oral Daily  . [MAR Hold] pregabalin  150 mg Oral Daily  . [MAR Hold] pregabalin  75 mg Oral QHS  . [MAR Hold] zolpidem  10 mg Oral QHS    Assessment: [name] presented to ED 12/29 with new R sided weakness.  MRI showed bilateral innumerable punctate acute infarcts consistent with embolic showering.  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  To start a heparin infusion.  Was previously on SQ Heparin - last dose this morning at 0600.  PMH: HTN, anxiety, depression, GERD, fibromyalgia  Goal of Therapy:  Heparin level 0.3-0.5 units/ml Monitor platelets by anticoagulation protocol: Yes   Plan:  D/C SQ heparin Start heparin infusion at 800 units/hr - no  bolus Heparin level in 8 hours Heparin level and CBC daily while on heparin  [employer], Pharm.D., BCPS Clinical Pharmacist Pager [phone] 10/18/2015 3:59 PM

## 2015-10-19 DIAGNOSIS — D151 Benign neoplasm of heart: Secondary | ICD-10-CM

## 2015-10-19 DIAGNOSIS — E876 Hypokalemia: Secondary | ICD-10-CM

## 2015-10-19 DIAGNOSIS — E785 Hyperlipidemia, unspecified: Secondary | ICD-10-CM

## 2015-10-19 LAB — PROTEIN C, TOTAL: PROTEIN C, TOTAL: 75 % (ref 60–150)

## 2015-10-19 LAB — BASIC METABOLIC PANEL
Anion gap: 7 (ref 5–15)
BUN: 5 mg/dL — AB (ref 6–20)
CHLORIDE: 115 mmol/L — AB (ref 101–111)
CO2: 20 mmol/L — AB (ref 22–32)
Calcium: 9 mg/dL (ref 8.9–10.3)
Creatinine, Ser: 0.85 mg/dL (ref 0.44–1.00)
GFR calc Af Amer: >60 mL/min (ref >60)
GFR calc non Af Amer: >60 mL/min (ref >60)
GLUCOSE: 95 mg/dL (ref 65–99)
POTASSIUM: 3.7 mmol/L (ref 3.5–5.1)
Sodium: 142 mmol/L (ref 135–145)

## 2015-10-19 LAB — CBC
HEMATOCRIT: 32.4 % — AB (ref 36.0–46.0)
HEMOGLOBIN: 10.5 g/dL — AB (ref 12.0–15.0)
MCH: 29 pg (ref 26.0–34.0)
MCHC: 32.4 g/dL (ref 30.0–36.0)
MCV: 89.5 fL (ref 78.0–100.0)
Platelets: 237 10*3/uL (ref 150–400)
RBC: 3.62 MIL/uL — AB (ref 3.87–5.11)
RDW: 13.8 % (ref 11.5–15.5)
WBC: 5.3 10*3/uL (ref 4.0–10.5)

## 2015-10-19 LAB — ANTI-DNA ANTIBODY, DOUBLE-STRANDED: ds DNA Ab: 1 IU/mL (ref 0–9)

## 2015-10-19 LAB — HEMOGLOBIN A1C
HEMOGLOBIN A1C: 5.9 % — AB (ref 4.8–5.6)
MEAN PLASMA GLUCOSE: 123 mg/dL

## 2015-10-19 LAB — HEPARIN LEVEL (UNFRACTIONATED)
Heparin Unfractionated: 0.26 IU/mL — ABNORMAL LOW (ref 0.30–0.70)
Heparin Unfractionated: 0.5 IU/mL (ref 0.30–0.70)
Heparin Unfractionated: 0.61 IU/mL (ref 0.30–0.70)

## 2015-10-19 LAB — MAGNESIUM: Magnesium: 1.5 mg/dL — ABNORMAL LOW (ref 1.7–2.4)

## 2015-10-19 NOTE — Progress Notes (Addendum)
ANTICOAGULATION CONSULT NOTE - Follow-up Consult  Pharmacy Consult for Heparin Indication: stroke  Patient Measurements: Height: 5\' 1"  (154.9 cm) Weight: 163 lb 11.2 oz (74.254 kg) IBW/kg (Calculated) : 47.8 Heparin Dosing Weight: 64.1 kg  Vital Signs: Temp: 98.6 F (37 C) (12/31 2159) Temp Source: Oral (12/31 2159) BP: 125/80 mmHg (12/31 2159) Pulse Rate: 85 (12/31 2159)  Labs:  Recent Labs  10/17/15 1606 10/17/15 1610 10/19/15 0250 10/19/15 0251 10/19/15 1250 10/19/15 2251  HGB 12.3 15.3* 10.5*  --   --   --   HCT 39.4 45.0 32.4*  --   --   --   PLT 208  --  237  --   --   --   APTT 35  --   --   --   --   --   LABPROT 14.2  --   --   --   --   --   INR 1.08  --   --   --   --   --   HEPARINUNFRC  --   --   --  0.26* 0.50 0.61  CREATININE 1.17* 1.10* 0.85  --   --   --      Assessment: [name] presented to ED 12/29 with new R sided weakness.  MRI showed bilateral innumerable punctate acute infarcts consistent with embolic showering.  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 Heparin level now up to 0.61 - supratherapeutic for low heparin goal. No bleeding noted.  Goal of Therapy:  Heparin level 0.3-0.5 units/ml Monitor platelets by anticoagulation protocol: Yes   Plan:  Decrease heparin infusion to 800 units/hr  Heparin level in 8 hours  Sherlon Handing, PharmD, BCPS Clinical pharmacist, pager [phone] 10/19/2015, 11:44 PM   Addendum:  Heparin level at 0400 is down to 0.49 (within therapeutic range). Will still get 0800 HL to verify that it doesn't get too low.   Sherlon Handing, PharmD, BCPS Clinical pharmacist, pager [phone] 10/20/2015 4:18 AM

## 2015-10-19 NOTE — Progress Notes (Signed)
    SUBJECTIVE:  She denies chest pain or SOB   PHYSICAL EXAM Filed Vitals:   10/18/15 1444 10/18/15 1723 10/18/15 2127 10/19/15 0532  BP: 108/67 116/73 124/89 113/72  Pulse: 78 82 116 86  Temp: 98.6 F (37 C) 98.4 F (36.9 C) 98.9 F (37.2 C) 99.4 F (37.4 C)  TempSrc: Oral Oral Oral Oral  Resp: 18 18 20 20   Height:      Weight:      SpO2: 100% 100% 100% 98%   General:  No distress Lungs:  Clear Heart:  RRR Abdomen:  Positive bowel sounds, no rebound no guarding Extremities:  No edema  LABS: No results found for: TROPONINI Results for orders placed or performed during the hospital encounter of 10/17/15 (from the past 24 hour(s))  Basic metabolic panel     Status: Abnormal   Collection Time: 10/19/15  2:50 AM  Result Value Ref Range   Sodium 142 135 - 145 mmol/L   Potassium 3.7 3.5 - 5.1 mmol/L   Chloride 115 (H) 101 - 111 mmol/L   CO2 20 (L) 22 - 32 mmol/L   Glucose, Bld 95 65 - 99 mg/dL   BUN 5 (L) 6 - 20 mg/dL   Creatinine, Ser 0.85 0.44 - 1.00 mg/dL   Calcium 9.0 8.9 - 10.3 mg/dL   GFR calc non Af Amer >60 >60 mL/min   GFR calc Af Amer >60 >60 mL/min   Anion gap 7 5 - 15  Magnesium     Status: Abnormal   Collection Time: 10/19/15  2:50 AM  Result Value Ref Range   Magnesium 1.5 (L) 1.7 - 2.4 mg/dL  CBC     Status: Abnormal   Collection Time: 10/19/15  2:50 AM  Result Value Ref Range   WBC 5.3 4.0 - 10.5 K/uL   RBC 3.62 (L) 3.87 - 5.11 MIL/uL   Hemoglobin 10.5 (L) 12.0 - 15.0 g/dL   HCT 32.4 (L) 36.0 - 46.0 %   MCV 89.5 78.0 - 100.0 fL   MCH 29.0 26.0 - 34.0 pg   MCHC 32.4 30.0 - 36.0 g/dL   RDW 13.8 11.5 - 15.5 %   Platelets 237 150 - 400 K/uL  Heparin level (unfractionated)     Status: Abnormal   Collection Time: 10/19/15  2:51 AM  Result Value Ref Range   Heparin Unfractionated 0.26 (L) 0.30 - 0.70 IU/mL   No intake or output data in the 24 hours ending 10/19/15 0925   ASSESSMENT AND PLAN:  CARDIAC MASS:   This would be the cause of the  stroke.  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  MRI was poor quality.  She is on heparin.  I will discuss with Dr. Prescott Gum today.  No plan for loop recorder.     Jeneen Rinks [facility] 10/19/2015 9:25 AM

## 2015-10-19 NOTE — Progress Notes (Signed)
OT Cancellation Note  Patient Details Name: Julia Schwartz MRN: [id number] DOB: [birth year]   Cancelled Treatment:    Reason Eval/Treat Not Completed: Other (comment) (waiting 24 hours heparin and lab draws for appropriateness)  Vonita Moss   OTR/L Pager: [phone] Office: [phone] .  10/19/2015, 3:10 PM

## 2015-10-19 NOTE — Progress Notes (Signed)
Patient had no bleeding issues heparin dose was increased to 900 unit/hr at 0340. Will continue to monitor.

## 2015-10-19 NOTE — Evaluation (Signed)
Speech Language Pathology Evaluation Patient Details Name: Julia Schwartz MRN: [id number] DOB: Apr 01, 1951 Today's Date: 10/19/2015 Time: 1523-     Problem List:  Patient Active Problem List   Diagnosis Date Noted  . Hypokalemia   . Myxoma of heart   . HLD (hyperlipidemia)   . Tobacco use disorder   . Embolic stroke ([place]) 99991111  . HTN (hypertension) 10/17/2015   Past Medical History:  Past Medical History  Diagnosis Date  . Hypertension   . Anxiety   . Depression   . Arthritis   . GERD (gastroesophageal reflux disease)   . Chronic back pain   . Wears dentures     top-partial bottom  . Wears glasses   . Fibromyalgia    Past Surgical History:  Past Surgical History  Procedure Laterality Date  . Submandibular gland excision      left  . Abdominal hysterectomy    . Tonsillectomy    . Bunionectomy      both feet  . Fracture surgery      fx lt ankle   HPI:  Julia Schwartz is a 64 y.o. female with h/o HTN, anxiety, depression. Brought to ED as code stroke status for new onset R sided weakness, code stroke status was canceled as she had returned to baseline. No h/o prior stroke or TIA. CT scan of head was negative, MRI brain; however, shows bilateral innumerable punctate acute infarcts c/w embolic showering   Assessment / Plan / Recommendation Clinical Impression  Pt presenting with moderate cognitive deficits. These characterized by decreased thought organzation, decreased novel recall, decreased executive functioning skills, and reduced safety awarness. Daughter present who denies premorbid cognitive linguistic impairments. Note highest educational level achieved, 11th grade. Pt reports being previoulsy independent with ADLs including medicines and finances. Pt lives with her 2 children whom are available only intermittently upon DC due to work obligations. Pts residual right extremity weakness impacted her ability for adequate penmanship during clock drawing  task. Skilled ST intervention indicated to assist with acute cognitive decline and educate for compensatory strategies.       SLP Assessment  Patient needs continued Speech Lanaguage Pathology Services    Follow Up Recommendations  24 hour supervision/assistance    Frequency and Duration min 2x/week  1 week      SLP Evaluation Prior Functioning  Cognitive/Linguistic Baseline:  (denies baseline deficits, none reported by family present) Type of Home: House  Lives With: Family;Son;Daughter Available Help at Discharge: Family;Available PRN/intermittently (children work) Education: 11th grade Vocation: Unemployed   Cognition  Overall Cognitive Status: Impaired/Different from baseline Arousal/Alertness: Lethargic Orientation Level: Oriented to person;Oriented to place;Disoriented to time;Disoriented to situation Attention: Focused;Sustained Focused Attention: Impaired Sustained Attention: Impaired Memory: Impaired Memory Impairment: Decreased recall of new information;Decreased short term memory Decreased Short Term Memory: Verbal basic Awareness: Impaired Problem Solving: Impaired Problem Solving Impairment: Functional basic Executive Function: Self Monitoring;Initiating;Reasoning Reasoning: Impaired Initiating: Impaired Self Monitoring: Impaired Safety/Judgment: Impaired    Comprehension  Auditory Comprehension Overall Auditory Comprehension: Appears within functional limits for tasks assessed    Expression Expression Primary Mode of Expression: Verbal Verbal Expression Overall Verbal Expression: Appears within functional limits for tasks assessed Written Expression Dominant Hand: Right (attempted clock drawing, pt with R weakness)   Oral / Motor Motor Speech Overall Motor Speech: Appears within functional limits for tasks assessed     Arvil Chaco MA, [place] Language Pathologist    Levi Aland 10/19/2015, 4:02 PM

## 2015-10-19 NOTE — Progress Notes (Signed)
PT Cancellation Note  Patient Details Name: Julia Schwartz MRN: [id number] DOB: Aug 10, 1951   Cancelled Treatment:    Reason Eval/Treat Not Completed: Patient not medically ready (Pending 24 hour post heprin start and therapeutic range to treat).     Zenia Resides, Marcellis Frampton L 10/19/2015, 3:30 PM

## 2015-10-19 NOTE — Progress Notes (Signed)
STROKE TEAM PROGRESS NOTE   HISTORY Julia Schwartz is an 64 y.o. female with a history of hypertension, anxiety, depression and fibromyalgia, brought to the emergency room and code stroke status with a complaint of new onset right-sided weakness. She has no previous history of stroke nor TIA. She has not been on antiplatelet therapy. CT scan of the head showed no acute findings. Hyperdensity was noted involving the left ICA terminus. Significance is unclear. Patient's exam showed no clear objective deficits. She was also emotionally labile. Code stroke was subsequently canceled. Patient was not considered for TPA secondary to no neurologic deficit. She was admitted for further evaluation and treatment.   SUBJECTIVE (INTERVAL HISTORY) Her daughters are at the bedside.  Overall she feels her condition is stable. Still has right hemiparesis and right facial droop with some improvement. On heparin drip. Cardiothoracic surgery to see.    OBJECTIVE Temp:  [98.1 F (36.7 C)-99.4 F (37.4 C)] 98.7 F (37.1 C) (12/31 1849) Pulse Rate:  [85-116] 85 (12/31 1849) Cardiac Rhythm:  [-] Sinus tachycardia (12/31 0800) Resp:  [15-20] 15 (12/31 1849) BP: (112-127)/(61-92) 112/71 mmHg (12/31 1849) SpO2:  [98 %-100 %] 99 % (12/31 1849)  CBC:   Recent Labs Lab 10/17/15 1606 10/17/15 1610 10/19/15 0250  WBC 6.1  --  5.3  NEUTROABS 1.8  --   --   HGB 12.3 15.3* 10.5*  HCT 39.4 45.0 32.4*  MCV 92.1  --  89.5  PLT 208  --  [id number]    Basic Metabolic Panel:   Recent Labs Lab 10/17/15 1606 10/17/15 1610 10/19/15 0250  NA 140 143 142  K 3.0* 2.9* 3.7  CL 109 108 115*  CO2 19*  --  20*  GLUCOSE 125* 122* 95  BUN 8 11 5*  CREATININE 1.17* 1.10* 0.85  CALCIUM 9.2  --  9.0  MG  --   --  1.5*    Lipid Panel:     Component Value Date/Time   CHOL 141 10/18/2015 0539   TRIG 78 10/18/2015 0539   HDL 42 10/18/2015 0539   CHOLHDL 3.4 10/18/2015 0539   VLDL 16 10/18/2015 0539   LDLCALC 83  10/18/2015 0539   HgbA1c:  Lab Results  Component Value Date   HGBA1C 5.9* 10/18/2015   Urine Drug Screen:     Component Value Date/Time   LABOPIA POSITIVE* 10/17/2015 1810   COCAINSCRNUR NONE DETECTED 10/17/2015 1810   LABBENZ NONE DETECTED 10/17/2015 1810   AMPHETMU NONE DETECTED 10/17/2015 1810   THCU NONE DETECTED 10/17/2015 1810   LABBARB NONE DETECTED 10/17/2015 1810      IMAGING I have personally reviewed the radiological images below and agree with the radiology interpretations.  Ct Head Wo Contrast 10/17/2015   Suspicious asymmetric hyperdensity at the left ICA terminus, but otherwise normal for age noncontrast CT appearance of the brain.   CT HEAD 10/17/2015   : No abnormal intracranial enhancement.   CTA NECK 10/17/2015   : No acute vascular process or hemodynamically significant stenosis.   CTA HEAD 10/17/2015   : No acute large vessel occlusion or high-grade stenosis. Complete circle of Willis.   Mr Brain Wo Contrast 10/17/2015   Innumerable punctate infarctions affecting both cerebral hemispheres, much more extensive on the left than the right, which could be due to a shower of micro emboli or watershed infarctions. No large confluent infarction. No mass effect or hemorrhage.   Bilateral lower extremity venous duplex  Visualized veins of bilateral lower extremities  are negative for deep vein thrombosis. There is no evidence of Baker's cyst bilaterally.  2D Echocardiogram  - Left ventricle: The cavity size was normal. There was mild focalbasal hypertrophy of the septum. Systolic function was normal.The estimated ejection fraction was 55%. Wall motion was normal;there were no regional wall motion abnormalities. - Atrial septum: No defect or patent foramen ovale was identified. - Impressions:  No cardiac source of emboli was indentified.  TEE - There is a large mobile echodensity attached to the supero-anterior portion of the interatrial septum from the left  atrial side. The echodensity measures 27 x 29 mm and has one largest lobe with multiple smaller thin mobile parts. The mass appears myxomatous. It is not obstructing flow.  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 A cardiac MRI is recommended for further evaluation.   MRI heart - pending but poor quality   PHYSICAL EXAM  Temp:  [98.1 F (36.7 C)-99.4 F (37.4 C)] 98.7 F (37.1 C) (12/31 1849) Pulse Rate:  [85-116] 85 (12/31 1849) Resp:  [15-20] 15 (12/31 1849) BP: (112-127)/(61-92) 112/71 mmHg (12/31 1849) SpO2:  [98 %-100 %] 99 % (12/31 1849)  General - Well nourished, well developed, in no apparent distress, but lethargic.  Ophthalmologic - Fundi not visualized due to noncooperation.  Cardiovascular - Regular rate and rhythm.  Mental Status -  Level of arousal and orientation to place, and person were intact, but not to time. On language exam, pt has significant perseveration, fluent in sentences, 1/4 naming, able to repeat, following simple commands.  Cranial Nerves II - XII - II - right upper quadrant quadranopia. III, IV, VI - Extraocular movements intact. V - Facial sensation intact bilaterally. VII - right facial droop. VIII - Hearing & vestibular intact bilaterally. X - Palate elevates symmetrically. XI - Chin turning & shoulder shrug intact bilaterally XII - Tongue protrusion intact.  Motor Strength - The patient's strength was right UE proximal 4/5 and distal 2/5 with pronator drift, RLE proximal 5-/5 and distal 4-/5. LLE and LUE 5/5. Bulk was normal and fasciculations were absent.   Motor Tone - Muscle tone was assessed at the neck and appendages and was normal.  Reflexes - The patient's reflexes were 1+ in all extremities and she had no pathological reflexes.  Sensory - Light touch, temperature/pinprick were assessed and were symmetrical.    Coordination - The patient had normal movements in the hands with no ataxia or  dysmetria, but slow on the right.  Tremor was absent.  Gait and Station - not tested.   ASSESSMENT/PLAN Ms. Julia Schwartz is a 64 y.o. female with history of hypertension, anxiety, depression and fibromyalgia presenting with right-sided weakness. She did not receive IV t-PA due to no observable deficits.   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   Heparin iv for VTE prophylaxis Diet Heart Room service appropriate?: Yes; Fluid consistency:: Thin  No antithrombotic prior to admission, now on heparin drip.  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  Hypertension  Runs on the lower side up until 1010 this am 74/52, 84/58 - this am 153/88 Permissive hypertension (OK if < 220/120) but gradually normalize in 5-7 days  Hyperlipidemia  Home meds:  No statin  LDL 83, goal < 70  Now on lipitor 20  Continue statin at discharge  Tobacco abuse  Current smoker  Smoking cessation counseling provided  Pt is willing to quit  Other Stroke Risk Factors  Obesity, Body mass index is 30.95 kg/(m^2).   Other Active Problems  Fibromyalgia   Depression/anxiety  GERD   chronic back pain  Hospital day #  2  Rosalin Hawking, MD PhD Stroke Neurology 10/19/2015 7:12 PM   To contact Stroke Continuity provider, please refer to [email or web address] After hours, contact General Neurology

## 2015-10-19 NOTE — Progress Notes (Signed)
ANTICOAGULATION CONSULT NOTE - Follow-up Consult  Pharmacy Consult for Heparin Indication: stroke  Patient Measurements: Height: 5\' 1"  (154.9 cm) Weight: 163 lb 11.2 oz (74.254 kg) IBW/kg (Calculated) : 47.8 Heparin Dosing Weight: 64.1 kg  Vital Signs: Temp: 98.6 F (37 C) (12/31 1329) Temp Source: Oral (12/31 1329) BP: 122/61 mmHg (12/31 1329) Pulse Rate: 98 (12/31 1329)  Labs:  Recent Labs  10/17/15 1606 10/17/15 1610 10/19/15 0250 10/19/15 0251 10/19/15 1250  HGB 12.3 15.3* 10.5*  --   --   HCT 39.4 45.0 32.4*  --   --   PLT 208  --  237  --   --   APTT 35  --   --   --   --   LABPROT 14.2  --   --   --   --   INR 1.08  --   --   --   --   HEPARINUNFRC  --   --   --  0.26* 0.50  CREATININE 1.17* 1.10* 0.85  --   --      Assessment: [name] presented to ED 12/29 with new R sided weakness.  MRI showed bilateral innumerable punctate acute infarcts consistent with embolic showering.  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 HL 0.5 on 900 units/hr (goal 0.3-0.5) Previous HL 0.26 on 800 units/hr. No issues or sxs of bleeding.  Goal of Therapy:  Heparin level 0.3-0.5 units/ml Monitor platelets by anticoagulation protocol: Yes   Plan:  Adjust heparin infusion to 850 units/hr  Heparin level in 8 hours Heparin level and CBC daily while on heparin  Vincenza Hews, PharmD, BCPS 10/19/2015, 2:33 PM Pager: [phone]

## 2015-10-19 NOTE — Progress Notes (Signed)
PATIENT DETAILS Name: Julia Schwartz Age: 63 y.o. Sex: female Date of Birth: [birth year] Admit Date: 10/17/2015 Admitting Physician Etta Quill, DO PCP:No primary care provider on file.  Subjective: Sleeping comfortably this morning-but much more awake and alert compared to yesterday. 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 Dr. Wyline Beady a heparin infusion. Cardiac MRI official results pending-but apparently a poor quality per cardiology note-cardiology will discuss with cardiothoracic surgery today. Await PT/OT evaluation  Active Problems: Hypokalemia: Repleted,recheck periodically  HTN (hypertension): Allow mild permissive hypertension setting of acute CVA-continue to hold antihypertensives for now.  GERD: Continue PPI  Anxiety/depression: appears stable-continue with BuSpar, Cymbalta, Remeron  Disposition: Remain inpatient  Antimicrobial agents  See below  Anti-infectives    None      DVT Prophylaxis: Heparin gtt  Code Status: Full code   Family Communication Daughter sleeping at bedside 12/31 Daughter/sons at bedside 12/30   Procedures: TEE 12/30  CONSULTS:  cardiology and neurology  Time spent 25 minutes-Greater than 50% of this time was spent in counseling, explanation of diagnosis, planning of further management, and coordination of care.  MEDICATIONS: Scheduled Meds: . atorvastatin  20 mg Oral q1800  . busPIRone  15 mg Oral BID  . DULoxetine  60 mg Oral Daily  . pantoprazole  80 mg Oral Daily  . pregabalin  150 mg Oral Daily  . pregabalin  75 mg Oral QHS  . zolpidem  10 mg Oral QHS   Continuous Infusions: . sodium chloride 100 mL/hr at 10/18/15 0800  . heparin 900 Units/hr (10/19/15 0337)   PRN Meds:.HYDROcodone-acetaminophen,  tiZANidine    PHYSICAL EXAM: Vital signs in last 24 hours: Filed Vitals:   10/18/15 2127 10/19/15 0532 10/19/15 0944 10/19/15 1329  BP: 124/89 113/72 127/92 122/61  Pulse: 116 86 93 98  Temp: 98.9 F (37.2 C) 99.4 F (37.4 C) 98.1 F (36.7 C) 98.6 F (37 C)  TempSrc: Oral Oral Oral Oral  Resp: 20 20 17 16   Height:      Weight:      SpO2: 100% 98% 98% 100%    Weight change:  Filed Weights   10/17/15 2153  Weight: 74.254 kg (163 lb 11.2 oz)   Body mass index is 30.95 kg/(m^2).   Gen Exam: Awake-slight dysarthria Neck: Supple, No JVD.   Chest: B/L Clear.   CVS: S1 S2 Regular, no murmurs.  Abdomen: soft, BS +, non tender, non distended.  Extremities: no edema, lower extremities warm to touch. Neurologic: RUE/RLE 4/5   Skin: No Rash.   Wounds: N/A.    Intake/Output from previous day: No intake or output data in the 24 hours ending 10/19/15 1332   LAB RESULTS: CBC  Recent Labs Lab 10/17/15 1606 10/17/15 1610 10/19/15 0250  WBC 6.1  --  5.3  HGB 12.3 15.3* 10.5*  HCT 39.4 45.0 32.4*  PLT 208  --  237  MCV 92.1  --  89.5  MCH 28.7  --  29.0  MCHC 31.2  --  32.4  RDW 14.0  --  13.8  LYMPHSABS 3.3  --   --   MONOABS 0.7  --   --   EOSABS 0.2  --   --   BASOSABS 0.0  --   --  Chemistries   Recent Labs Lab 10/17/15 1606 10/17/15 1610 10/19/15 0250  NA 140 143 142  K 3.0* 2.9* 3.7  CL 109 108 115*  CO2 19*  --  20*  GLUCOSE 125* 122* 95  BUN 8 11 5*  CREATININE 1.17* 1.10* 0.85  CALCIUM 9.2  --  9.0  MG  --   --  1.5*    CBG:  Recent Labs Lab 10/17/15 1615  GLUCAP 108*    GFR Estimated Creatinine Clearance: 62.5 mL/min (by C-G formula based on Cr of 0.85).  Coagulation profile  Recent Labs Lab 10/17/15 1606  INR 1.08    Cardiac Enzymes No results for input(s): CKMB, TROPONINI, MYOGLOBIN in the last 168 hours.  Invalid input(s): CK  Invalid input(s): POCBNP No results for input(s): DDIMER in the last 72 hours.  Recent  Labs  10/18/15 0539  HGBA1C 5.9*    Recent Labs  10/18/15 0539  CHOL 141  HDL 42  LDLCALC 83  TRIG 78  CHOLHDL 3.4   No results for input(s): TSH, T4TOTAL, T3FREE, THYROIDAB in the last 72 hours.  Invalid input(s): FREET3 No results for input(s): VITAMINB12, FOLATE, FERRITIN, TIBC, IRON, RETICCTPCT in the last 72 hours. No results for input(s): LIPASE, AMYLASE in the last 72 hours.  Urine Studies No results for input(s): UHGB, CRYS in the last 72 hours.  Invalid input(s): UACOL, UAPR, USPG, UPH, UTP, UGL, UKET, UBIL, UNIT, UROB, ULEU, UEPI, UWBC, URBC, UBAC, CAST, UCOM, BILUA  MICROBIOLOGY: Recent Results (from the past 240 hour(s))  Culture, blood (routine x 2)     Status: None (Preliminary result)   Collection Time: 10/17/15 10:20 PM  Result Value Ref Range Status   Specimen Description BLOOD LEFT HAND  Final   Special Requests BOTTLES DRAWN AEROBIC ONLY [place]  Final   Culture NO GROWTH 1 DAY  Final   Report Status PENDING  Incomplete  Culture, blood (routine x 2)     Status: None (Preliminary result)   Collection Time: 10/17/15 10:28 PM  Result Value Ref Range Status   Specimen Description BLOOD RIGHT ANTECUBITAL  Final   Special Requests BOTTLES DRAWN AEROBIC AND ANAEROBIC 5CC EA  Final   Culture NO GROWTH 1 DAY  Final   Report Status PENDING  Incomplete    RADIOLOGY STUDIES/RESULTS: Ct Angio Head W/cm &/or Wo Cm  10/17/2015  CLINICAL DATA:  Followup stroke symptoms.  History of hypertension. EXAM: CT ANGIOGRAPHY HEAD AND NECK TECHNIQUE: Multidetector CT imaging of the head and neck was performed using the standard protocol during bolus administration of intravenous contrast. Multiplanar CT image reconstructions and MIPs were obtained to evaluate the vascular anatomy. Carotid stenosis measurements (when applicable) are obtained utilizing NASCET criteria, using the distal internal carotid diameter as the denominator. CONTRAST:  138mL OMNIPAQUE IOHEXOL 350 MG/ML SOLN  COMPARISON:  MRI of the brain October 17, 2015 at 1838 hours. FINDINGS: CTA NECK Aortic arch: Normal appearance of the thoracic arch, normal branch pattern. The origins of the innominate, left Common carotid artery and subclavian artery are widely patent. Right carotid system: Common carotid artery is widely patent, coursing in a straight line fashion. Normal appearance of the carotid bifurcation without hemodynamically significant stenosis by NASCET criteria. Minimal eccentric calcific atherosclerosis and intimal thickening. Normal appearance of the included internal carotid artery. Left carotid system: Common carotid artery is widely patent, coursing in a straight line fashion. Normal appearance of the carotid bifurcation without hemodynamically significant stenosis by NASCET criteria. Minimal eccentric calcific  atherosclerosis and intimal thickening. Normal appearance of the included internal carotid artery. Vertebral arteries:Left vertebral artery is dominant. Normal appearance of the vertebral arteries, which appear widely patent. Skeleton: No acute osseous process though bone windows have not been submitted. Patient is edentulous. 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 No destructive bony lesions. Other neck: Soft tissues of the neck are nonacute though, not tailored for evaluation. Included heart appears mildly enlarged. Prominent main pulmonary artery, incompletely imaged. Calcified hilar lymph nodes. Atrophic versus resected LEFT submandibular gland. Centrally edematous RIGHT submandibular gland without sialolith or inflammatory changes. CTA HEAD Anterior circulation: Patent cervical internal carotid arteries, petrous, cavernous and supra clinoid internal carotid arteries. Moderate calcific atherosclerosis of the carotid siphons. Widely patent anterior communicating artery. Normal appearance of the anterior and middle cerebral arteries. Supernumerary anterior cerebral artery arising from LEFT  A1-2 junction. Posterior circulation: Normal appearance of the vertebral arteries, vertebrobasilar junction and basilar artery, as well as main branch vessels. Bilateral posterior communicating arteries contribute to posterior circulation. Normal appearance of the posterior cerebral arteries. No large vessel occlusion, hemodynamically significant stenosis, dissection, luminal irregularity, contrast extravasation or aneurysm within the anterior nor posterior circulation. No abnormal intracranial enhancement on delayed phase. IMPRESSION: CT HEAD: No abnormal intracranial enhancement. CTA NECK: No acute vascular process or hemodynamically significant stenosis. CTA HEAD: No acute large vessel occlusion or high-grade stenosis. Complete circle of Willis. Electronically Signed   By: Elon Alas M.D.   On: 10/17/2015 21:46   Ct Head Wo Contrast  10/17/2015  ADDENDUM REPORT: 10/17/2015 16:40 ADDENDUM: Study discussed by telephone with Dr. Wallie Char on 10/17/2015 at 1624 hours. Electronically Signed   By: Genevie Ann M.D.   On: 10/17/2015 16:40  10/17/2015  CLINICAL DATA:  64 year old female code stroke. Right side weakness and abnormal speech. Last seen normal 1510 hours. Initial encounter. EXAM: CT HEAD WITHOUT CONTRAST TECHNIQUE: Contiguous axial images were obtained from the base of the skull through the vertex without intravenous contrast. COMPARISON:  None. FINDINGS: Visualized paranasal sinuses and mastoids are clear. No acute osseous abnormality identified. Visualized orbits and scalp soft tissues are within normal limits. Calcified atherosclerosis at the skull base.  Dural calcifications. Asymmetric hyperdensity at the left ICA terminus on series 201, image 7. No cortically based acute infarct identified. No acute intracranial hemorrhage identified. No midline shift, mass effect, or evidence of intracranial mass lesion. Gray-white matter differentiation is within normal limits throughout the brain.  IMPRESSION: Suspicious asymmetric hyperdensity at the left ICA terminus, but otherwise normal for age noncontrast CT appearance of the brain. Electronically Signed: By: Genevie Ann M.D. On: 10/17/2015 16:20   Ct Angio Neck W/cm &/or Wo/cm  10/17/2015  CLINICAL DATA:  Followup stroke symptoms.  History of hypertension. EXAM: CT ANGIOGRAPHY HEAD AND NECK TECHNIQUE: Multidetector CT imaging of the head and neck was performed using the standard protocol during bolus administration of intravenous contrast. Multiplanar CT image reconstructions and MIPs were obtained to evaluate the vascular anatomy. Carotid stenosis measurements (when applicable) are obtained utilizing NASCET criteria, using the distal internal carotid diameter as the denominator. CONTRAST:  122mL OMNIPAQUE IOHEXOL 350 MG/ML SOLN COMPARISON:  MRI of the brain October 17, 2015 at 1838 hours. FINDINGS: CTA NECK Aortic arch: Normal appearance of the thoracic arch, normal branch pattern. The origins of the innominate, left Common carotid artery and subclavian artery are widely patent. Right carotid system: Common carotid artery is widely patent, coursing in a straight line fashion. Normal appearance of the carotid bifurcation without hemodynamically  significant stenosis by NASCET criteria. Minimal eccentric calcific atherosclerosis and intimal thickening. Normal appearance of the included internal carotid artery. Left carotid system: Common carotid artery is widely patent, coursing in a straight line fashion. Normal appearance of the carotid bifurcation without hemodynamically significant stenosis by NASCET criteria. Minimal eccentric calcific atherosclerosis and intimal thickening. Normal appearance of the included internal carotid artery. Vertebral arteries:Left vertebral artery is dominant. Normal appearance of the vertebral arteries, which appear widely patent. Skeleton: No acute osseous process though bone windows have not been submitted. Patient is  edentulous. 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 No destructive bony lesions. Other neck: Soft tissues of the neck are nonacute though, not tailored for evaluation. Included heart appears mildly enlarged. Prominent main pulmonary artery, incompletely imaged. Calcified hilar lymph nodes. Atrophic versus resected LEFT submandibular gland. Centrally edematous RIGHT submandibular gland without sialolith or inflammatory changes. CTA HEAD Anterior circulation: Patent cervical internal carotid arteries, petrous, cavernous and supra clinoid internal carotid arteries. Moderate calcific atherosclerosis of the carotid siphons. Widely patent anterior communicating artery. Normal appearance of the anterior and middle cerebral arteries. Supernumerary anterior cerebral artery arising from LEFT A1-2 junction. Posterior circulation: Normal appearance of the vertebral arteries, vertebrobasilar junction and basilar artery, as well as main branch vessels. Bilateral posterior communicating arteries contribute to posterior circulation. Normal appearance of the posterior cerebral arteries. No large vessel occlusion, hemodynamically significant stenosis, dissection, luminal irregularity, contrast extravasation or aneurysm within the anterior nor posterior circulation. No abnormal intracranial enhancement on delayed phase. IMPRESSION: CT HEAD: No abnormal intracranial enhancement. CTA NECK: No acute vascular process or hemodynamically significant stenosis. CTA HEAD: No acute large vessel occlusion or high-grade stenosis. Complete circle of Willis. Electronically Signed   By: Elon Alas M.D.   On: 10/17/2015 21:46   Mr Brain Wo Contrast  10/17/2015  CLINICAL DATA:  Code stroke status with new onset of right-sided weakness. EXAM: MRI HEAD WITHOUT CONTRAST TECHNIQUE: Multiplanar, multiecho pulse sequences of the brain and surrounding structures were obtained without intravenous contrast. COMPARISON:  Head CT same day  FINDINGS: No abnormality is seen affecting the brainstem. No acute infarction affecting the cerebellum. I think there are a few old small vessel infarctions within the cerebellum. Cerebral hemispheres show background pattern of a few small foci of T2 and FLAIR signal in the white matter suggesting mild small vessel disease. There are scattered punctate acute infarctions in both hemispheres, much more extensive on the left than the right, largely in a watershed distribution. These could represent a shower of micro embolic emboli or could actually relate to watershed infarction. No large confluent infarction. No mass effect or gross hemorrhage. No hydrocephalus. No extra-axial collection. No pituitary mass. No inflammatory sinus disease. No skull or skullbase lesion. Major vessels at the base of the brain show flow. IMPRESSION: Innumerable punctate infarctions affecting both cerebral hemispheres, much more extensive on the left than the right, which could be due to a shower of micro emboli or watershed infarctions. No large confluent infarction. No mass effect or hemorrhage. Electronically Signed   By: Nelson Chimes M.D.   On: 10/17/2015 19:09    Oren Binet, MD  [facility] Pager:[phone]  If 7PM-7AM, please contact night-coverage [email or web address] Password [email or web address] 10/19/2015, 1:32 PM   LOS: 2 days

## 2015-10-19 NOTE — Progress Notes (Signed)
ANTICOAGULATION CONSULT NOTE - Follow-up Consult  Pharmacy Consult for Heparin Indication: stroke  Allergies  Allergen Reactions  . Ultram [Tramadol] Hives    Patient Measurements: Height: 5\' 1"  (154.9 cm) Weight: 163 lb 11.2 oz (74.254 kg) IBW/kg (Calculated) : 47.8 Heparin Dosing Weight: 64.1 kg  Vital Signs: Temp: 98.9 F (37.2 C) (12/30 2127) Temp Source: Oral (12/30 2127) BP: 124/89 mmHg (12/30 2127) Pulse Rate: 116 (12/30 2127)  Labs:  Recent Labs  10/17/15 1606 10/17/15 1610 10/19/15 0250 10/19/15 0251  HGB 12.3 15.3* 10.5*  --   HCT 39.4 45.0 32.4*  --   PLT 208  --  237  --   APTT 35  --   --   --   LABPROT 14.2  --   --   --   INR 1.08  --   --   --   HEPARINUNFRC  --   --   --  0.26*  CREATININE 1.17* 1.10*  --   --     Estimated Creatinine Clearance: 48.3 mL/min (by C-G formula based on Cr of 1.1).   Assessment: [name] presented to ED 12/29 with new R sided weakness.  MRI showed bilateral innumerable punctate acute infarcts consistent with embolic showering.  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  Pt continues on heparin gtt. Heparin level 0.26 (subtherapeutic) on 800 units/hr. No issues with line or bleeding reported per RN.  Goal of Therapy:  Heparin level 0.3-0.5 units/ml Monitor platelets by anticoagulation protocol: Yes   Plan:  Increase heparin infusion to 900 units/hr  Heparin level in 8 hours Heparin level and CBC daily while on heparin  Sherlon Handing, PharmD, BCPS Clinical pharmacist, pager [phone] 10/19/2015 3:31 AM

## 2015-10-20 LAB — CBC
HEMATOCRIT: 32.7 % — AB (ref 36.0–46.0)
HEMOGLOBIN: 10.9 g/dL — AB (ref 12.0–15.0)
MCH: 29.6 pg (ref 26.0–34.0)
MCHC: 33.3 g/dL (ref 30.0–36.0)
MCV: 88.9 fL (ref 78.0–100.0)
Platelets: 215 10*3/uL (ref 150–400)
RBC: 3.68 MIL/uL — AB (ref 3.87–5.11)
RDW: 13.7 % (ref 11.5–15.5)
WBC: 5.1 10*3/uL (ref 4.0–10.5)

## 2015-10-20 LAB — HOMOCYSTEINE: HOMOCYSTEINE-NORM: 5.3 umol/L (ref 0.0–15.0)

## 2015-10-20 LAB — HEPARIN LEVEL (UNFRACTIONATED)
HEPARIN UNFRACTIONATED: 0.49 [IU]/mL (ref 0.30–0.70)
HEPARIN UNFRACTIONATED: 0.51 [IU]/mL (ref 0.30–0.70)

## 2015-10-20 NOTE — Progress Notes (Signed)
PATIENT DETAILS Name: Shere Genthe Age: 65 y.o. Sex: female Date of Birth: 02-08-1951 Admit Date: 10/17/2015 Admitting Physician Etta Quill, DO PCP:No primary care provider on file.  Subjective:  Right-sided weakness seems to have slightly improved. 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 Dr. Wyline Beady a heparin infusion. Cardiac MRI official results pending-but apparently a poor quality per cardiology note. Await CTVS eval-cardiology has already notified cardiothoracic surgery.  PT recommending CIR  Active Problems: Hypokalemia: Repleted,recheck periodically  HTN (hypertension): Allow mild permissive hypertension setting of acute CVA-continue to hold antihypertensives for now.  GERD: Continue PPI  Anxiety/depression: appears stable-continue with BuSpar, Cymbalta, Remeron  Disposition: Remain inpatient  Antimicrobial agents  See below  Anti-infectives    None      DVT Prophylaxis: Heparin gtt  Code Status: Full code   Family Communication Daughter sleeping at bedside 12/31 Daughter/sons at bedside 12/30   Procedures: TEE 12/30  CONSULTS:  cardiology and neurology  Time spent 25 minutes-Greater than 50% of this time was spent in counseling, explanation of diagnosis, planning of further management, and coordination of care.  MEDICATIONS: Scheduled Meds: . atorvastatin  20 mg Oral q1800  . busPIRone  15 mg Oral BID  . DULoxetine  60 mg Oral Daily  . pantoprazole  80 mg Oral Daily  . pregabalin  150 mg Oral Daily  . pregabalin  75 mg Oral QHS  . zolpidem  10 mg Oral QHS   Continuous Infusions: . sodium chloride 100 mL/hr at 10/18/15 0800  . heparin 800 Units/hr (10/19/15 2354)   PRN Meds:.HYDROcodone-acetaminophen, tiZANidine    PHYSICAL EXAM: Vital signs in last  24 hours: Filed Vitals:   10/20/15 0141 10/20/15 0511 10/20/15 1000 10/20/15 1340  BP: 119/75 93/69 114/67 123/79  Pulse: 82 83 78 78  Temp: 98.4 F (36.9 C) 98.1 F (36.7 C) 98.7 F (37.1 C) 99 F (37.2 C)  TempSrc: Oral Oral Oral Oral  Resp: 16 16 18 18   Height:      Weight:      SpO2: 97% 97% 98% 98%    Weight change:  Filed Weights   10/17/15 2153  Weight: 74.254 kg (163 lb 11.2 oz)   Body mass index is 30.95 kg/(m^2).   Gen Exam: Awake-slight dysarthria Neck: Supple, No JVD.   Chest: B/L Clear.   CVS: S1 S2 Regular, no murmurs.  Abdomen: soft, BS +, non tender, non distended.  Extremities: no edema, lower extremities warm to touch. Neurologic: RUE/RLE 4/5   Skin: No Rash.   Wounds: N/A.    Intake/Output from previous day: No intake or output data in the 24 hours ending 10/20/15 1411   LAB RESULTS: CBC  Recent Labs Lab 10/17/15 1606 10/17/15 1610 10/19/15 0250 10/20/15 0305  WBC 6.1  --  5.3 5.1  HGB 12.3 15.3* 10.5* 10.9*  HCT 39.4 45.0 32.4* 32.7*  PLT 208  --  237 215  MCV 92.1  --  89.5 88.9  MCH 28.7  --  29.0 29.6  MCHC 31.2  --  32.4 33.3  RDW 14.0  --  13.8 13.7  LYMPHSABS 3.3  --   --   --   MONOABS 0.7  --   --   --   EOSABS 0.2  --   --   --  BASOSABS 0.0  --   --   --     Chemistries   Recent Labs Lab 10/17/15 1606 10/17/15 1610 10/19/15 0250  NA 140 143 142  K 3.0* 2.9* 3.7  CL 109 108 115*  CO2 19*  --  20*  GLUCOSE 125* 122* 95  BUN 8 11 5*  CREATININE 1.17* 1.10* 0.85  CALCIUM 9.2  --  9.0  MG  --   --  1.5*    CBG:  Recent Labs Lab 10/17/15 1615  GLUCAP 108*    GFR Estimated Creatinine Clearance: 62.5 mL/min (by C-G formula based on Cr of 0.85).  Coagulation profile  Recent Labs Lab 10/17/15 1606  INR 1.08    Cardiac Enzymes No results for input(s): CKMB, TROPONINI, MYOGLOBIN in the last 168 hours.  Invalid input(s): CK  Invalid input(s): POCBNP No results for input(s): DDIMER in the last 72  hours.  Recent Labs  10/18/15 0539  HGBA1C 5.9*    Recent Labs  10/18/15 0539  CHOL 141  HDL 42  LDLCALC 83  TRIG 78  CHOLHDL 3.4   No results for input(s): TSH, T4TOTAL, T3FREE, THYROIDAB in the last 72 hours.  Invalid input(s): FREET3 No results for input(s): VITAMINB12, FOLATE, FERRITIN, TIBC, IRON, RETICCTPCT in the last 72 hours. No results for input(s): LIPASE, AMYLASE in the last 72 hours.  Urine Studies No results for input(s): UHGB, CRYS in the last 72 hours.  Invalid input(s): UACOL, UAPR, USPG, UPH, UTP, UGL, UKET, UBIL, UNIT, UROB, ULEU, UEPI, UWBC, URBC, UBAC, CAST, UCOM, BILUA  MICROBIOLOGY: Recent Results (from the past 240 hour(s))  Culture, blood (routine x 2)     Status: None (Preliminary result)   Collection Time: 10/17/15 10:20 PM  Result Value Ref Range Status   Specimen Description BLOOD LEFT HAND  Final   Special Requests BOTTLES DRAWN AEROBIC ONLY [place]  Final   Culture NO GROWTH 1 DAY  Final   Report Status PENDING  Incomplete  Culture, blood (routine x 2)     Status: None (Preliminary result)   Collection Time: 10/17/15 10:28 PM  Result Value Ref Range Status   Specimen Description BLOOD RIGHT ANTECUBITAL  Final   Special Requests BOTTLES DRAWN AEROBIC AND ANAEROBIC 5CC EA  Final   Culture NO GROWTH 1 DAY  Final   Report Status PENDING  Incomplete    RADIOLOGY STUDIES/RESULTS: Ct Angio Head W/cm &/or Wo Cm  10/17/2015  CLINICAL DATA:  Followup stroke symptoms.  History of hypertension. EXAM: CT ANGIOGRAPHY HEAD AND NECK TECHNIQUE: Multidetector CT imaging of the head and neck was performed using the standard protocol during bolus administration of intravenous contrast. Multiplanar CT image reconstructions and MIPs were obtained to evaluate the vascular anatomy. Carotid stenosis measurements (when applicable) are obtained utilizing NASCET criteria, using the distal internal carotid diameter as the denominator. CONTRAST:  158mL OMNIPAQUE IOHEXOL  350 MG/ML SOLN COMPARISON:  MRI of the brain October 17, 2015 at 1838 hours. FINDINGS: CTA NECK Aortic arch: Normal appearance of the thoracic arch, normal branch pattern. The origins of the innominate, left Common carotid artery and subclavian artery are widely patent. Right carotid system: Common carotid artery is widely patent, coursing in a straight line fashion. Normal appearance of the carotid bifurcation without hemodynamically significant stenosis by NASCET criteria. Minimal eccentric calcific atherosclerosis and intimal thickening. Normal appearance of the included internal carotid artery. Left carotid system: Common carotid artery is widely patent, coursing in a straight line fashion. Normal appearance  of the carotid bifurcation without hemodynamically significant stenosis by NASCET criteria. Minimal eccentric calcific atherosclerosis and intimal thickening. Normal appearance of the included internal carotid artery. Vertebral arteries:Left vertebral artery is dominant. Normal appearance of the vertebral arteries, which appear widely patent. Skeleton: No acute osseous process though bone windows have not been submitted. Patient is edentulous. 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 No destructive bony lesions. Other neck: Soft tissues of the neck are nonacute though, not tailored for evaluation. Included heart appears mildly enlarged. Prominent main pulmonary artery, incompletely imaged. Calcified hilar lymph nodes. Atrophic versus resected LEFT submandibular gland. Centrally edematous RIGHT submandibular gland without sialolith or inflammatory changes. CTA HEAD Anterior circulation: Patent cervical internal carotid arteries, petrous, cavernous and supra clinoid internal carotid arteries. Moderate calcific atherosclerosis of the carotid siphons. Widely patent anterior communicating artery. Normal appearance of the anterior and middle cerebral arteries. Supernumerary anterior cerebral artery  arising from LEFT A1-2 junction. Posterior circulation: Normal appearance of the vertebral arteries, vertebrobasilar junction and basilar artery, as well as main branch vessels. Bilateral posterior communicating arteries contribute to posterior circulation. Normal appearance of the posterior cerebral arteries. No large vessel occlusion, hemodynamically significant stenosis, dissection, luminal irregularity, contrast extravasation or aneurysm within the anterior nor posterior circulation. No abnormal intracranial enhancement on delayed phase. IMPRESSION: CT HEAD: No abnormal intracranial enhancement. CTA NECK: No acute vascular process or hemodynamically significant stenosis. CTA HEAD: No acute large vessel occlusion or high-grade stenosis. Complete circle of Willis. Electronically Signed   By: Elon Alas M.D.   On: 10/17/2015 21:46   Ct Head Wo Contrast  10/17/2015  ADDENDUM REPORT: 10/17/2015 16:40 ADDENDUM: Study discussed by telephone with Dr. Wallie Char on 10/17/2015 at 1624 hours. Electronically Signed   By: Genevie Ann M.D.   On: 10/17/2015 16:40  10/17/2015  CLINICAL DATA:  65 year old female code stroke. Right side weakness and abnormal speech. Last seen normal 1510 hours. Initial encounter. EXAM: CT HEAD WITHOUT CONTRAST TECHNIQUE: Contiguous axial images were obtained from the base of the skull through the vertex without intravenous contrast. COMPARISON:  None. FINDINGS: Visualized paranasal sinuses and mastoids are clear. No acute osseous abnormality identified. Visualized orbits and scalp soft tissues are within normal limits. Calcified atherosclerosis at the skull base.  Dural calcifications. Asymmetric hyperdensity at the left ICA terminus on series 201, image 7. No cortically based acute infarct identified. No acute intracranial hemorrhage identified. No midline shift, mass effect, or evidence of intracranial mass lesion. Gray-white matter differentiation is within normal limits  throughout the brain. IMPRESSION: Suspicious asymmetric hyperdensity at the left ICA terminus, but otherwise normal for age noncontrast CT appearance of the brain. Electronically Signed: By: Genevie Ann M.D. On: 10/17/2015 16:20   Ct Angio Neck W/cm &/or Wo/cm  10/17/2015  CLINICAL DATA:  Followup stroke symptoms.  History of hypertension. EXAM: CT ANGIOGRAPHY HEAD AND NECK TECHNIQUE: Multidetector CT imaging of the head and neck was performed using the standard protocol during bolus administration of intravenous contrast. Multiplanar CT image reconstructions and MIPs were obtained to evaluate the vascular anatomy. Carotid stenosis measurements (when applicable) are obtained utilizing NASCET criteria, using the distal internal carotid diameter as the denominator. CONTRAST:  190mL OMNIPAQUE IOHEXOL 350 MG/ML SOLN COMPARISON:  MRI of the brain October 17, 2015 at 1838 hours. FINDINGS: CTA NECK Aortic arch: Normal appearance of the thoracic arch, normal branch pattern. The origins of the innominate, left Common carotid artery and subclavian artery are widely patent. Right carotid system: Common carotid artery is widely patent,  coursing in a straight line fashion. Normal appearance of the carotid bifurcation without hemodynamically significant stenosis by NASCET criteria. Minimal eccentric calcific atherosclerosis and intimal thickening. Normal appearance of the included internal carotid artery. Left carotid system: Common carotid artery is widely patent, coursing in a straight line fashion. Normal appearance of the carotid bifurcation without hemodynamically significant stenosis by NASCET criteria. Minimal eccentric calcific atherosclerosis and intimal thickening. Normal appearance of the included internal carotid artery. Vertebral arteries:Left vertebral artery is dominant. Normal appearance of the vertebral arteries, which appear widely patent. Skeleton: No acute osseous process though bone windows have not been  submitted. Patient is edentulous. 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 No destructive bony lesions. Other neck: Soft tissues of the neck are nonacute though, not tailored for evaluation. Included heart appears mildly enlarged. Prominent main pulmonary artery, incompletely imaged. Calcified hilar lymph nodes. Atrophic versus resected LEFT submandibular gland. Centrally edematous RIGHT submandibular gland without sialolith or inflammatory changes. CTA HEAD Anterior circulation: Patent cervical internal carotid arteries, petrous, cavernous and supra clinoid internal carotid arteries. Moderate calcific atherosclerosis of the carotid siphons. Widely patent anterior communicating artery. Normal appearance of the anterior and middle cerebral arteries. Supernumerary anterior cerebral artery arising from LEFT A1-2 junction. Posterior circulation: Normal appearance of the vertebral arteries, vertebrobasilar junction and basilar artery, as well as main branch vessels. Bilateral posterior communicating arteries contribute to posterior circulation. Normal appearance of the posterior cerebral arteries. No large vessel occlusion, hemodynamically significant stenosis, dissection, luminal irregularity, contrast extravasation or aneurysm within the anterior nor posterior circulation. No abnormal intracranial enhancement on delayed phase. IMPRESSION: CT HEAD: No abnormal intracranial enhancement. CTA NECK: No acute vascular process or hemodynamically significant stenosis. CTA HEAD: No acute large vessel occlusion or high-grade stenosis. Complete circle of Willis. Electronically Signed   By: Elon Alas M.D.   On: 10/17/2015 21:46   Mr Brain Wo Contrast  10/17/2015  CLINICAL DATA:  Code stroke status with new onset of right-sided weakness. EXAM: MRI HEAD WITHOUT CONTRAST TECHNIQUE: Multiplanar, multiecho pulse sequences of the brain and surrounding structures were obtained without intravenous contrast. COMPARISON:   Head CT same day FINDINGS: No abnormality is seen affecting the brainstem. No acute infarction affecting the cerebellum. I think there are a few old small vessel infarctions within the cerebellum. Cerebral hemispheres show background pattern of a few small foci of T2 and FLAIR signal in the white matter suggesting mild small vessel disease. There are scattered punctate acute infarctions in both hemispheres, much more extensive on the left than the right, largely in a watershed distribution. These could represent a shower of micro embolic emboli or could actually relate to watershed infarction. No large confluent infarction. No mass effect or gross hemorrhage. No hydrocephalus. No extra-axial collection. No pituitary mass. No inflammatory sinus disease. No skull or skullbase lesion. Major vessels at the base of the brain show flow. IMPRESSION: Innumerable punctate infarctions affecting both cerebral hemispheres, much more extensive on the left than the right, which could be due to a shower of micro emboli or watershed infarctions. No large confluent infarction. No mass effect or hemorrhage. Electronically Signed   By: Nelson Chimes M.D.   On: 10/17/2015 19:09    Oren Binet, MD  [facility] Pager:[phone]  If 7PM-7AM, please contact night-coverage [email or web address] Password [email or web address] 10/20/2015, 2:11 PM   LOS: 3 days

## 2015-10-20 NOTE — Progress Notes (Signed)
Patient having difficulty urinating throughout the evening. She claims that she has difficulty with retention at home as well but has not been seen for this issue. Bladder scanner revealed greater than 451mL this morning. [name] daughter sought me out for this concern. MD paged and continuing to monitor. Emmerson Shuffield, Rande Brunt, RN

## 2015-10-20 NOTE — Progress Notes (Signed)
STROKE TEAM PROGRESS NOTE   HISTORY Julia Schwartz is an 65 y.o. female with a history of hypertension, anxiety, depression and fibromyalgia, brought to the emergency room and code stroke status with a complaint of new onset right-sided weakness. She has no previous history of stroke nor TIA. She has not been on antiplatelet therapy. CT scan of the head showed no acute findings. Hyperdensity was noted involving the left ICA terminus. Significance is unclear. Patient's exam showed no clear objective deficits. She was also emotionally labile. Code stroke was subsequently canceled. Patient was not considered for TPA secondary to no neurologic deficit. She was admitted for further evaluation and treatment.   SUBJECTIVE (INTERVAL HISTORY) Her daughter is at the bedside sleeping.  Overall she feels her condition is stable and improved. Still has right hemiparesis and right facial droop but improvement. On heparin drip. Cardiothoracic surgery to see.    OBJECTIVE Temp:  [98.1 F (36.7 C)-99 F (37.2 C)] 99 F (37.2 C) (01/01 1340) Pulse Rate:  [78-85] 78 (01/01 1340) Cardiac Rhythm:  [-] Normal sinus rhythm (01/01 0739) Resp:  [15-18] 18 (01/01 1340) BP: (93-125)/(67-80) 123/79 mmHg (01/01 1340) SpO2:  [97 %-100 %] 98 % (01/01 1340)  CBC:   Recent Labs Lab 10/17/15 1606  10/19/15 0250 10/20/15 0305  WBC 6.1  --  5.3 5.1  NEUTROABS 1.8  --   --   --   HGB 12.3  < > 10.5* 10.9*  HCT 39.4  < > 32.4* 32.7*  MCV 92.1  --  89.5 88.9  PLT 208  --  237 215  < > = values in this interval not displayed.  Basic Metabolic Panel:   Recent Labs Lab 10/17/15 1606 10/17/15 1610 10/19/15 0250  NA 140 143 142  K 3.0* 2.9* 3.7  CL 109 108 115*  CO2 19*  --  20*  GLUCOSE 125* 122* 95  BUN 8 11 5*  CREATININE 1.17* 1.10* 0.85  CALCIUM 9.2  --  9.0  MG  --   --  1.5*    Lipid Panel:     Component Value Date/Time   CHOL 141 10/18/2015 0539   TRIG 78 10/18/2015 0539   HDL 42 10/18/2015  0539   CHOLHDL 3.4 10/18/2015 0539   VLDL 16 10/18/2015 0539   LDLCALC 83 10/18/2015 0539   HgbA1c:  Lab Results  Component Value Date   HGBA1C 5.9* 10/18/2015   Urine Drug Screen:     Component Value Date/Time   LABOPIA POSITIVE* 10/17/2015 1810   COCAINSCRNUR NONE DETECTED 10/17/2015 1810   LABBENZ NONE DETECTED 10/17/2015 1810   AMPHETMU NONE DETECTED 10/17/2015 1810   THCU NONE DETECTED 10/17/2015 1810   LABBARB NONE DETECTED 10/17/2015 1810      IMAGING I have personally reviewed the radiological images below and agree with the radiology interpretations.  Ct Head Wo Contrast 10/17/2015   Suspicious asymmetric hyperdensity at the left ICA terminus, but otherwise normal for age noncontrast CT appearance of the brain.   CT HEAD 10/17/2015   : No abnormal intracranial enhancement.   CTA NECK 10/17/2015   : No acute vascular process or hemodynamically significant stenosis.   CTA HEAD 10/17/2015   : No acute large vessel occlusion or high-grade stenosis. Complete circle of Willis.   Mr Brain Wo Contrast 10/17/2015   Innumerable punctate infarctions affecting both cerebral hemispheres, much more extensive on the left than the right, which could be due to a shower of micro emboli or watershed  infarctions. No large confluent infarction. No mass effect or hemorrhage.   Bilateral lower extremity venous duplex  Visualized veins of bilateral lower extremities are negative for deep vein thrombosis. There is no evidence of Baker's cyst bilaterally.  2D Echocardiogram  - Left ventricle: The cavity size was normal. There was mild focalbasal hypertrophy of the septum. Systolic function was normal.The estimated ejection fraction was 55%. Wall motion was normal;there were no regional wall motion abnormalities. - Atrial septum: No defect or patent foramen ovale was identified. - Impressions:  No cardiac source of emboli was indentified.  TEE - There is a large mobile echodensity  attached to the supero-anterior portion of the interatrial septum from the left atrial side. The echodensity measures 27 x 29 mm and has one largest lobe with multiple smaller thin mobile parts. The mass appears myxomatous. It is not obstructing flow.  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 A cardiac MRI is recommended for further evaluation.   MRI heart - poor quality   PHYSICAL EXAM  Temp:  [98.1 F (36.7 C)-99 F (37.2 C)] 99 F (37.2 C) (01/01 1340) Pulse Rate:  [78-85] 78 (01/01 1340) Resp:  [15-18] 18 (01/01 1340) BP: (93-125)/(67-80) 123/79 mmHg (01/01 1340) SpO2:  [97 %-100 %] 98 % (01/01 1340)  General - Well nourished, well developed, in no apparent distress, but lethargic.  Ophthalmologic - Fundi not visualized due to noncooperation.  Cardiovascular - Regular rate and rhythm.  Mental Status -  Level of arousal and orientation to place, and person were intact, but not to time. On language exam, perseveration much improved, fluent in sentences, 3/4 naming, able to repeat, following simple commands.  Cranial Nerves II - XII - II - right upper quadrant quadranopia. III, IV, VI - Extraocular movements intact. V - Facial sensation intact bilaterally. VII - right mild facial droop. VIII - Hearing & vestibular intact bilaterally. X - Palate elevates symmetrically. XI - Chin turning & shoulder shrug intact bilaterally XII - Tongue protrusion intact.  Motor Strength - The patient's strength was right UE proximal 4+/5 and distal 3/5 with pronator drift, RLE proximal 5-/5 and distal 4+/5. LLE and LUE 5/5. Bulk was normal and fasciculations were absent.   Motor Tone - Muscle tone was assessed at the neck and appendages and was normal.  Reflexes - The patient's reflexes were 1+ in all extremities and she had no pathological reflexes.  Sensory - Light touch, temperature/pinprick were assessed and were symmetrical.    Coordination -  The patient had normal movements in the hands with no ataxia or dysmetria, but slow on the right.  Tremor was absent.  Gait and Station - not tested.   ASSESSMENT/PLAN Ms. Julia Schwartz is a 65 y.o. female with history of hypertension, anxiety, depression and fibromyalgia presenting with right-sided weakness. She did not receive IV t-PA due to no observable deficits.   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   Heparin iv for VTE prophylaxis  Diet Heart Room service appropriate?: Yes; Fluid consistency:: Thin  No antithrombotic prior to admission, now on heparin drip. May consider po coumadin depending on cardiothoracic surgery decision.  Patient counseled to be compliant with her antithrombotic medications  Ongoing aggressive stroke risk factor management  Therapy recommendations:  Inpatient rehabilitation recommended. MD consult ordered.  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   Hypertension  Runs on the lower side up until 1010 this am 74/52, 84/58 - this am 153/88 Permissive hypertension (OK if < 220/120) but gradually normalize in 5-7 days  Hyperlipidemia  Home meds:  No statin  LDL 83, goal < 70  Now on lipitor 20  Continue statin at discharge  Tobacco abuse  Current smoker  Smoking cessation counseling provided  Pt is willing to quit  Other Stroke Risk  Factors  Obesity, Body mass index is 30.95 kg/(m^2).   Other Active Problems  Fibromyalgia   Depression/anxiety  GERD  Chronic back pain  Hospital day # 3  Neurology will sign off. Please call with questions. Pt will follow up with Dr. Erlinda Hong at [facility] in about 2 months. Thanks for the consult.  Rosalin Hawking, MD PhD Stroke Neurology 10/20/2015 3:34 PM    To contact Stroke Continuity provider, please refer to [email or web address] After hours, contact General Neurology

## 2015-10-20 NOTE — Evaluation (Signed)
Physical Therapy Evaluation Patient Details Name: Julia Schwartz MRN: [id number] DOB: [birth year] Today's Date: 10/20/2015   History of Present Illness  Pt is a 65 y/o F brought to ED w/ code stroke.  Imaging revealed Bil Lt>Rt punctate infarcts.  [name] PMH includes fibromyalgia, depression, anxiety, chronic back pain.  Clinical Impression  Pt admitted with above diagnosis. Pt currently with functional limitations due to the deficits listed below (see PT Problem List). Ms. Dolder present w/ cognitive deficits likely different from her baseline.  Unable to confirm pt's history but pt reports she will have 24/7 assist from her daughter available at d/c.  Recommending CIR, pt currently requires mod assist w/ ambulation. Pt will benefit from skilled PT to increase their independence and safety with mobility to allow discharge to the venue listed below.      Follow Up Recommendations CIR    Equipment Recommendations  Other (comment) (TBD as pt progresses)    Recommendations for Other Services OT consult;Rehab consult     Precautions / Restrictions Precautions Precautions: Fall Restrictions Weight Bearing Restrictions: No      Mobility  Bed Mobility Overal bed mobility: Needs Assistance Bed Mobility: Supine to Sit;Sit to Supine     Supine to sit: Min assist Sit to supine: Min guard   General bed mobility comments: Min assist using bed pad to direct pelvis.  Pt uses bed rail and requires increased time and verbal cues for technique. Min guard assist to return to supine w/ increased time and verbal cues  Transfers Overall transfer level: Needs assistance Equipment used: 1 person hand held assist Transfers: Sit to/from Stand Sit to Stand: Min assist         General transfer comment: Min assist to boost up to stand and for stability.  Cues for reaching back when sitting.  Ambulation/Gait Ambulation/Gait assistance: Mod assist Ambulation Distance (Feet): 50 Feet Assistive  device: 1 person hand held assist Gait Pattern/deviations: Step-through pattern;Decreased dorsiflexion - right;Decreased stride length;Antalgic;Drifts right/left   Gait velocity interpretation: <1.8 ft/sec, indicative of risk for recurrent falls General Gait Details: Pt drifting to Rt and requires verbal cues to avoid MD in hallway.  Mod assist for stability, pt WB through Lt UE for support.  Rt foot drop notetd.  Stairs            Wheelchair Mobility    Modified Rankin (Stroke Patients Only) Modified Rankin (Stroke Patients Only) Pre-Morbid Rankin Score: No symptoms Modified Rankin: Moderately severe disability     Balance Overall balance assessment: Needs assistance Sitting-balance support: Feet supported;Bilateral upper extremity supported Sitting balance-Leahy Scale: Fair     Standing balance support: Single extremity supported;During functional activity Standing balance-Leahy Scale: Poor Standing balance comment: Relies on assist for support                             Pertinent Vitals/Pain Pain Assessment: 0-10 Pain Score: 8  Pain Location: chronic low back pain Pain Descriptors / Indicators: Aching Pain Intervention(s): Limited activity within patient's tolerance;Monitored during session;Repositioned    Home Living Family/patient expects to be discharged to:: Private residence Living Arrangements: Children (brother, daughter, son, grandchild) Available Help at Discharge: Family;Available 24 hours/day Type of Home: House Home Access: Stairs to enter Entrance Stairs-Rails: Psychiatric nurse of Steps: 3 Home Layout: Two level;Able to live on main level with bedroom/bathroom Home Equipment: Gilford Rile - 2 wheels;Shower seat;Wheelchair - power Additional Comments: Pt reports she purchased a power WC a  few years ago for no reason other than to have it in case she would one day need it    Prior Function Level of Independence: Independent                Hand Dominance   Dominant Hand: Right    Extremity/Trunk Assessment   Upper Extremity Assessment: Defer to OT evaluation           Lower Extremity Assessment: RLE deficits/detail;LLE deficits/detail RLE Deficits / Details: grossly 3/5 strength LLE Deficits / Details: grossly 4/5 strength     Communication   Communication: Expressive difficulties  Cognition Arousal/Alertness: Lethargic Behavior During Therapy: Flat affect Overall Cognitive Status: Impaired/Different from baseline Area of Impairment: Orientation;Memory;Safety/judgement;Awareness;Problem solving Orientation Level: Disoriented to;Situation (struggling to remember she has had a stroke)   Memory: Decreased short-term memory   Safety/Judgement: Decreased awareness of safety;Decreased awareness of deficits Awareness: Emergent Problem Solving: Slow processing;Requires verbal cues General Comments: Counting backward from 100 by 7's: "93, 87, 77, 67, 57".      General Comments      Exercises        Assessment/Plan    PT Assessment Patient needs continued PT services  PT Diagnosis Difficulty walking;Hemiplegia dominant side;Generalized weakness;Altered mental status   PT Problem List Decreased strength;Decreased activity tolerance;Decreased balance;Decreased cognition;Decreased knowledge of use of DME;Decreased safety awareness;Impaired sensation;Pain  PT Treatment Interventions DME instruction;Gait training;Stair training;Functional mobility training;Therapeutic activities;Therapeutic exercise;Balance training;Neuromuscular re-education;Cognitive remediation;Patient/family education   PT Goals (Current goals can be found in the Care Plan section) Acute Rehab PT Goals Patient Stated Goal: none stated PT Goal Formulation: With patient Time For Goal Achievement: 11/03/15 Potential to Achieve Goals: Good    Frequency Min 4X/week   Barriers to discharge Inaccessible home  environment steps to enter home    Co-evaluation               End of Session Equipment Utilized During Treatment: Gait belt Activity Tolerance: Patient tolerated treatment well;Patient limited by fatigue Patient left: in bed;with call bell/phone within reach;with bed alarm set;with family/visitor present Nurse Communication: Mobility status         Time: [id number] PT Time Calculation (min) (ACUTE ONLY): 26 min   Charges:   PT Evaluation $Initial PT Evaluation Tier I: 1 Procedure PT Treatments $Gait Training: 8-22 mins   PT G CodesJoslyn Hy PT, DPT [phone] Pager: [phone] 10/20/2015, 1:27 PM

## 2015-10-20 NOTE — Progress Notes (Signed)
ANTICOAGULATION CONSULT NOTE - Follow-up Consult  Pharmacy Consult for Heparin Indication: stroke  Patient Measurements: Height: 5\' 1"  (154.9 cm) Weight: 163 lb 11.2 oz (74.254 kg) IBW/kg (Calculated) : 47.8 Heparin Dosing Weight: 64.1 kg  Vital Signs: Temp: 98.7 F (37.1 C) (01/01 1000) Temp Source: Oral (01/01 1000) BP: 114/67 mmHg (01/01 1000) Pulse Rate: 78 (01/01 1000)  Labs:  Recent Labs  10/17/15 1606 10/17/15 1610 10/19/15 0250  10/19/15 2251 10/20/15 0305 10/20/15 0833  HGB 12.3 15.3* 10.5*  --   --  10.9*  --   HCT 39.4 45.0 32.4*  --   --  32.7*  --   PLT 208  --  237  --   --  215  --   APTT 35  --   --   --   --   --   --   LABPROT 14.2  --   --   --   --   --   --   INR 1.08  --   --   --   --   --   --   HEPARINUNFRC  --   --   --   < > 0.61 0.49 0.51  CREATININE 1.17* 1.10* 0.85  --   --   --   --   < > = values in this interval not displayed.   Assessment: 65 yo F presented to ED 12/29 with new R sided weakness. MRI showed bilateral innumerable punctate acute infarcts consistent with embolic showering. 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 Last two heparin levels have been 0.49 and 0.51 (goal 0.3-0.5) on heparin at 800 units/hr.  Goal of Therapy:  Heparin level 0.3-0.5 units/ml Monitor platelets by anticoagulation protocol: Yes   Plan:  1. Continue heparin infusion at 800 units/hr as current rate has been maintaining fairly steady levels with last few checks 2. HL in am and daily  Vincenza Hews, PharmD, BCPS 10/20/2015, 11:26 AM Pager: [phone]

## 2015-10-21 DIAGNOSIS — D213 Benign neoplasm of connective and other soft tissue of thorax: Secondary | ICD-10-CM

## 2015-10-21 DIAGNOSIS — M797 Fibromyalgia: Secondary | ICD-10-CM

## 2015-10-21 DIAGNOSIS — Z720 Tobacco use: Secondary | ICD-10-CM

## 2015-10-21 DIAGNOSIS — I10 Essential (primary) hypertension: Secondary | ICD-10-CM

## 2015-10-21 LAB — CBC
HEMATOCRIT: 33.8 % — AB (ref 36.0–46.0)
HEMOGLOBIN: 11 g/dL — AB (ref 12.0–15.0)
MCH: 28.6 pg (ref 26.0–34.0)
MCHC: 32.5 g/dL (ref 30.0–36.0)
MCV: 88 fL (ref 78.0–100.0)
PLATELETS: 240 10*3/uL (ref 150–400)
RBC: 3.84 MIL/uL — AB (ref 3.87–5.11)
RDW: 13.4 % (ref 11.5–15.5)
WBC: 4.7 10*3/uL (ref 4.0–10.5)

## 2015-10-21 LAB — CARDIOLIPIN ANTIBODIES, IGG, IGM, IGA
Anticardiolipin IgA: <9 APL U/mL (ref 0–11)
Anticardiolipin IgM: <9 MPL U/mL (ref 0–12)

## 2015-10-21 LAB — HEPARIN LEVEL (UNFRACTIONATED): Heparin Unfractionated: 0.47 IU/mL (ref 0.30–0.70)

## 2015-10-21 LAB — BETA-2-GLYCOPROTEIN I ABS, IGG/M/A
Beta-2-Glycoprotein I IgA: <9 GPI IgA units (ref 0–25)
Beta-2-Glycoprotein I IgM: <9 GPI IgM units (ref 0–32)

## 2015-10-21 NOTE — Progress Notes (Signed)
Inpatient Rehabilitation  I spoke with the patient and her family (son, daughter and additional family members) at the bedside regarding the recommendation for CIR.  I explained the purpose and logistics of the CIR program.  Pt.'s family members are strongly encouraging pt. to agree to CIR however pt. remains reluctant and asked to think about it until tomorrow.  I provided pt. and family with informational brochures and answered their questions.  I will follow up with pt. tomorrow for her decision regarding CIR.  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  This would not allow an uninterupted stay on CIR for full participation in her therapies.  Need to discuss timing of planned procedures with MD.   Please call if questions.  [place] Admissions Coordinator Cell [phone] Office [phone]

## 2015-10-21 NOTE — Progress Notes (Signed)
Occupational Therapy Evaluation Patient Details Name: Julia Schwartz MRN: [id number] DOB: 03/29/1951 Today's Date: 10/21/2015    History of Present Illness Pt is a 65 y/o F brought to ED w/ code stroke.  Imaging revealed Bil Lt>Rt punctate infarcts.  [name] PMH includes fibromyalgia, depression, anxiety, chronic back pain.   Clinical Impression   PTA, pt was independent with ADLs and functional mobility. Pt currently presents with R hemiparesis, R peripheral field deficits (~50 degrees), lethargy, and decreased safety/deficit awareness. Pt required min guard assist and mod verbal cues throughout session to complete ADL tasks. Discussed visual compensatory strategies and will be providing HEP to strengthen RUE. Also discussed post-acute rehab options (inpatient rehab vs. HHOT vs. OP OT) as pt not interested in inpatient rehab at this time. Currently recommending CIR - feel as though pt would benefit significantly from this type of rehab stay. Will continue to follow acutely to address OT needs and goals.     Follow Up Recommendations  CIR    Equipment Recommendations  Other (comment) (TBD in next venue)    Recommendations for Other Services       Precautions / Restrictions Precautions Precautions: Fall Restrictions Weight Bearing Restrictions: No      Mobility Bed Mobility Overal bed mobility: Needs Assistance Bed Mobility: Supine to Sit;Sit to Supine Rolling: Supervision   Supine to sit: Min guard Sit to supine: Min guard   General bed mobility comments: HOB flat, use of bedrails. Increased time to scoot to EOB  Transfers Overall transfer level: Needs assistance Equipment used: None Transfers: Sit to/from Stand Sit to Stand: Min guard         General transfer comment: No physical assist required. Min guard for safety.    Balance Overall balance assessment: Needs assistance Sitting-balance support: No upper extremity supported;Feet supported Sitting balance-Leahy  Scale: Fair     Standing balance support: No upper extremity supported;During functional activity Standing balance-Leahy Scale: Fair                              ADL Overall ADL's : Needs assistance/impaired Eating/Feeding: Set up;Cueing for sequencing;Cueing for compensatory techinques;Bed level Eating/Feeding Details (indicate cue type and reason): Cues to use RUE despite weakness                     Toilet Transfer: Min guard;Cueing for Licensed conveyancer and Hygiene: Min guard;Sit to/from stand       Functional mobility during ADLs: Min guard;Cueing for safety General ADL Comments: Educated pt to continue using RUE for all tasks to avoid progressive weakness. Pt required mod verbal cues for sequencing and attending to tasks. Pt also severely lethargic throughout session. Pt declined to ambulate further than room door and wanted to go back to the bed immediately.     Vision Vision Assessment?: Yes Eye Alignment: Within Functional Limits Ocular Range of Motion: Impaired-to be further tested in functional context (difficult to assess due to ptosis of R eye) Alignment/Gaze Preference: Within Defined Limits Tracking/Visual Pursuits: Decreased smoothness of horizontal tracking;Decreased smoothness of vertical tracking;Impaired - to be further tested in functional context (difficult to assess due to ptosis of R eye) Saccades: Impaired - to be further tested in functional context Convergence: Impaired - to be further tested in functional context Visual Fields: Right visual field deficit (R peripheral field cut - possibly due to ptosis of R eye) Additional Comments: Pt required mod  verbal cues to complete visual tasks due to severe lethargy.    Perception Perception Perception Tested?: Yes Perception Deficits: Inattention/neglect Inattention/Neglect: Does not attend to right side of body (Mild? Possibly due to lethargy)    Praxis      Pertinent Vitals/Pain Pain Assessment: No/denies pain Faces Pain Scale: No hurt     Hand Dominance Right   Extremity/Trunk Assessment Upper Extremity Assessment Upper Extremity Assessment: RUE deficits/detail RUE Deficits / Details: STRENGTH: all 3/5; AROM: shoulder 0-110 degrees, elbow wfl, wrist wfl; digits decreased composite extension; SENSATION: impaired light touch localization RUE Sensation: decreased proprioception RUE Coordination: decreased fine motor;decreased gross motor   Lower Extremity Assessment Lower Extremity Assessment: Defer to PT evaluation   Cervical / Trunk Assessment Cervical / Trunk Assessment: Normal   Communication Communication Communication: Expressive difficulties   Cognition Arousal/Alertness: Lethargic Behavior During Therapy: Flat affect Overall Cognitive Status: Impaired/Different from baseline Area of Impairment: Attention;Memory;Following commands;Safety/judgement;Awareness;Problem solving   Current Attention Level: Selective Memory: Decreased short-term memory Following Commands: Follows one step commands consistently;Follows one step commands with increased time Safety/Judgement: Decreased awareness of safety;Decreased awareness of deficits Awareness: Emergent Problem Solving: Slow processing;Decreased initiation;Requires verbal cues     General Comments       Exercises Exercises: Other exercises Other Exercises Other Exercises: Encouraged continued attempts to use Rt hand functionally. Taught to oppose thumb to each finger and work on finger extension   Shoulder Instructions      Home Living Family/patient expects to be discharged to:: Private residence Living Arrangements: Children (Son and daughter) Available Help at Discharge: Family;Available 24 hours/day Type of Home: House Home Access: Stairs to enter [employer] of Steps: 3 Entrance Stairs-Rails: Right;Left Home Layout: Two level;Able to live  on main level with bedroom/bathroom               Home Equipment: Gilford Rile - 2 wheels;Shower seat;Wheelchair - power   Additional Comments: Son works days and evenings. Daughter not currently working  Lives With: Family;Son;Daughter    Prior Functioning/Environment Level of Independence: Independent             OT Diagnosis: Disturbance of vision;Hemiplegia dominant side;Generalized weakness   OT Problem List: Decreased strength;Decreased range of motion;Decreased activity tolerance;Impaired balance (sitting and/or standing);Impaired vision/perception;Decreased coordination;Decreased cognition;Decreased safety awareness;Decreased knowledge of use of DME or AE;Impaired sensation;Impaired tone;Impaired UE functional use   OT Treatment/Interventions: Self-care/ADL training;Therapeutic exercise;Neuromuscular education;Energy conservation;DME and/or AE instruction;Therapeutic activities;Cognitive remediation/compensation;Balance training;Patient/family education;Visual/perceptual remediation/compensation    OT Goals(Current goals can be found in the care plan section) Acute Rehab OT Goals Patient Stated Goal: none stated OT Goal Formulation: With patient Potential to Achieve Goals: Good ADL Goals Pt Will Perform Upper Body Dressing: with modified independence;sitting Pt Will Perform Lower Body Dressing: with modified independence;sit to/from stand Pt Will Transfer to Toilet: with modified independence;ambulating;regular height toilet Pt Will Perform Toileting - Clothing Manipulation and hygiene: with modified independence;sit to/from stand Pt Will Perform Tub/Shower Transfer: Tub transfer;with supervision;ambulating;shower seat Pt/caregiver will Perform Home Exercise Program: Increased ROM;Increased strength;Right Upper extremity;With theraband;With theraputty;Independently;With written HEP provided Additional ADL Goal #1: Pt will independently demonstrate visual scanning strategies  with no verbal cues to increase safety with ADLs.  OT Frequency: Min 3X/week   Barriers to D/C: Decreased caregiver support  Sometimes pt is home alone during the day       Co-evaluation              End of Session Equipment Utilized During Treatment: Gait belt Nurse Communication: Mobility  status;Other (comment) (Keep pillow under RUE, place essential items on R side)  Activity Tolerance: Patient limited by lethargy Patient left: in bed;with call bell/phone within reach;with bed alarm set;with family/visitor present   Time: 1640-1700 OT Time Calculation (min): 20 min Charges:  OT General Charges $OT Visit: 1 Procedure OT Evaluation $Initial OT Evaluation Tier I: 1 Procedure G-Codes:    Redmond Baseman, OTR/L  10/21/2015, 5:15 PM Pager: [phone]

## 2015-10-21 NOTE — Progress Notes (Signed)
Physical Therapy Treatment Patient Details Name: Julia Schwartz MRN: [id number] DOB: [birth year] Today's Date: 10/21/2015    History of Present Illness Pt is a 65 y/o F brought to ED w/ code stroke.  Imaging revealed Bil Lt>Rt punctate infarcts.  [name] PMH includes fibromyalgia, depression, anxiety, chronic back pain.    PT Comments    Patient has made good progress with gait and balance. Continues with infrequent dragging of Rt toe/foot and when brought to her attention she can correct. Patient/family eager to meet OT for hand/UE input.   Follow Up Recommendations  CIR (if pt refuses/unable to go to CIR, she would prefer HHPT)     Equipment Recommendations  None recommended by PT    Recommendations for Other Services OT consult (contacted OT and pt is on schedule for today)     Precautions / Restrictions Precautions Precautions: Fall Restrictions Weight Bearing Restrictions: No    Mobility  Bed Mobility Overal bed mobility: Needs Assistance Bed Mobility: Supine to Sit;Rolling Rolling: Supervision   Supine to sit: Min guard     General bed mobility comments: pt with incr time to scoot to EOB  Transfers Overall transfer level: Needs assistance Equipment used: None Transfers: Sit to/from Stand Sit to Stand: Min guard         General transfer comment: pt wanted to stand up to straighten her gown and be able to reach her drink to take a sip; closeguarding with no signs of Rt knee buckling  Ambulation/Gait Ambulation/Gait assistance: Min guard Ambulation Distance (Feet): 100 Feet Assistive device: 1 person hand held assist;None Gait Pattern/deviations: Step-through pattern;Decreased dorsiflexion - right;Decreased stride length   Gait velocity interpretation: at or above normal speed for age/gender General Gait Details: initially with slight Rt foot drop; cues for longer steps and pt began improving DF and heelstrike; able to minimally incr velocity; able to perform  head turns with no deviations from straight path (more hesitant to look Rt)   Stairs Stairs: Yes Stairs assistance: Min assist Stair Management: One rail Left;Alternating pattern;Forwards Number of Stairs: 2 General stair comments: prior to up/down, pt able to alternately step-tap each foot on first step; initially caught Rt toes on lip of step and then able to adjust and did not hit rt toes again  Wheelchair Mobility    Modified Rankin (Stroke Patients Only) Modified Rankin (Stroke Patients Only) Pre-Morbid Rankin Score: No symptoms Modified Rankin: Moderately severe disability     Balance     Sitting balance-Leahy Scale: Fair     Standing balance support: No upper extremity supported Standing balance-Leahy Scale: Good                      Cognition Arousal/Alertness: Lethargic (initially sleeping ) Behavior During Therapy: Flat affect                        Exercises Other Exercises Other Exercises: Encouraged continued attempts to use Rt hand functionally. Taught to oppose thumb to each finger and work on finger extension    General Comments General comments (skin integrity, edema, etc.): Family present; Answered questions re: rehab or SNF or HHPT. Eager to work with OT as pt's Rt hand most effected      Pertinent Vitals/Pain Pain Assessment: Faces Faces Pain Scale: No hurt    Home Living                      Prior Function  PT Goals (current goals can now be found in the care plan section) Acute Rehab PT Goals Patient Stated Goal: none stated Time For Goal Achievement: 11/03/15 Progress towards PT goals: Progressing toward goals    Frequency  Min 4X/week    PT Plan Current plan remains appropriate    Co-evaluation             End of Session Equipment Utilized During Treatment: Gait belt Activity Tolerance: Patient tolerated treatment well Patient left: with call bell/phone within reach;with  family/visitor present;in chair     Time: 1345-1408 PT Time Calculation (min) (ACUTE ONLY): 23 min  Charges:  $Gait Training: 23-37 mins                    G Codes:      Curtez Brallier 11-05-15, 3:11 PM Pager [phone]

## 2015-10-21 NOTE — Care Management Important Message (Signed)
Important Message  Patient Details  Name: Maaya Bagot MRN: [id number] Date of Birth: [birth year]   Medicare Important Message Given:  Yes    Louanne Belton 10/21/2015, 12:39 [place] Message  Patient Details  Name: Shereta Shelburne MRN: [id number] Date of Birth: 06-19-1951   Medicare Important Message Given:  Yes    Aydn Ferrara, Neal Dy 10/21/2015, 12:38 PM

## 2015-10-21 NOTE — Progress Notes (Signed)
PATIENT DETAILS Name: Julia Schwartz Age: 65 y.o. Sex: female Date of Birth: 04/19/1951 Admit Date: 10/17/2015 Admitting Physician Etta Quill, DO PCP:No primary care provider on file.  Subjective: Right-sided weakness essentially unchanged. 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 Cardiac MRI official results pending-but apparently a poor quality per cardiology note. Per [facility] eval-needs LHC prior to surgery-which has been tentatively scheduled for Jan 13.PT recommending CIR  Active Problems: Hypokalemia: Repleted,recheck periodically  HTN (hypertension): Allow mild permissive hypertension setting of acute CVA-continue to hold antihypertensives for now.  GERD: Continue PPI  Anxiety/depression: appears stable-continue with BuSpar, Cymbalta, Remeron  Disposition: Remain inpatient-CIR vs SNF when work up complete  Antimicrobial agents  See below  Anti-infectives    None      DVT Prophylaxis: Heparin gtt  Code Status: Full code   Family Communication Multiple family members at bedside  Procedures: TEE 12/30  CONSULTS:  cardiology and neurology  Time spent 25 minutes-Greater than 50% of this time was spent in counseling, explanation of diagnosis, planning of further management, and coordination of care.  MEDICATIONS: Scheduled Meds: . atorvastatin  20 mg Oral q1800  . busPIRone  15 mg Oral BID  . DULoxetine  60 mg Oral Daily  . pantoprazole  80 mg Oral Daily  . pregabalin  150 mg Oral Daily  . pregabalin  75 mg Oral QHS  . zolpidem  10 mg Oral QHS   Continuous Infusions: . heparin 800 Units/hr (10/21/15 1112)   PRN Meds:.HYDROcodone-acetaminophen, tiZANidine    PHYSICAL EXAM: Vital signs in last 24 hours: Filed Vitals:   10/20/15 2149 10/21/15 0110 10/21/15 0510 10/21/15  0947  BP: 125/80 127/77 124/77 99/72  Pulse: 72 77 74 85  Temp: 98.3 F (36.8 C) 98.6 F (37 C) 98.4 F (36.9 C) 98.8 F (37.1 C)  TempSrc: Oral Oral Axillary Oral  Resp: 18 18 18 20   Height:      Weight:      SpO2: 98% 100% 100% 99%    Weight change:  Filed Weights   10/17/15 2153  Weight: 74.254 kg (163 lb 11.2 oz)   Body mass index is 30.95 kg/(m^2).   Gen Exam: Awake-slight dysarthria Neck: Supple, No JVD.   Chest: B/L Clear.   CVS: S1 S2 Regular, no murmurs.  Abdomen: soft, BS +, non tender, non distended.  Extremities: no edema, lower extremities warm to touch. Neurologic: RUE/RLE 4/5   Skin: No Rash.   Wounds: N/A.    Intake/Output from previous day:  Intake/Output Summary (Last 24 hours) at 10/21/15 1412 Last data filed at 10/20/15 1700  Gross per 24 hour  Intake    240 ml  Output      0 ml  Net    240 ml     LAB RESULTS: CBC  Recent Labs Lab 10/17/15 1606 10/17/15 1610 10/19/15 0250 10/20/15 0305 10/21/15 0345  WBC 6.1  --  5.3 5.1 4.7  HGB 12.3 15.3* 10.5* 10.9* 11.0*  HCT 39.4 45.0 32.4* 32.7* 33.8*  PLT 208  --  237 215 240  MCV 92.1  --  89.5 88.9 88.0  MCH 28.7  --  29.0 29.6 28.6  MCHC 31.2  --  32.4 33.3 32.5  RDW 14.0  --  13.8 13.7 13.4  LYMPHSABS 3.3  --   --   --   --  MONOABS 0.7  --   --   --   --   EOSABS 0.2  --   --   --   --   BASOSABS 0.0  --   --   --   --     Chemistries   Recent Labs Lab 10/17/15 1606 10/17/15 1610 10/19/15 0250  NA 140 143 142  K 3.0* 2.9* 3.7  CL 109 108 115*  CO2 19*  --  20*  GLUCOSE 125* 122* 95  BUN 8 11 5*  CREATININE 1.17* 1.10* 0.85  CALCIUM 9.2  --  9.0  MG  --   --  1.5*    CBG:  Recent Labs Lab 10/17/15 1615  GLUCAP 108*    GFR Estimated Creatinine Clearance: 62.5 mL/min (by C-G formula based on Cr of 0.85).  Coagulation profile  Recent Labs Lab 10/17/15 1606  INR 1.08    Cardiac Enzymes No results for input(s): CKMB, TROPONINI, MYOGLOBIN in the last 168  hours.  Invalid input(s): CK  Invalid input(s): POCBNP No results for input(s): DDIMER in the last 72 hours. No results for input(s): HGBA1C in the last 72 hours. No results for input(s): CHOL, HDL, LDLCALC, TRIG, CHOLHDL, LDLDIRECT in the last 72 hours. No results for input(s): TSH, T4TOTAL, T3FREE, THYROIDAB in the last 72 hours.  Invalid input(s): FREET3 No results for input(s): VITAMINB12, FOLATE, FERRITIN, TIBC, IRON, RETICCTPCT in the last 72 hours. No results for input(s): LIPASE, AMYLASE in the last 72 hours.  Urine Studies No results for input(s): UHGB, CRYS in the last 72 hours.  Invalid input(s): UACOL, UAPR, USPG, UPH, UTP, UGL, UKET, UBIL, UNIT, UROB, ULEU, UEPI, UWBC, URBC, UBAC, CAST, UCOM, BILUA  MICROBIOLOGY: Recent Results (from the past 240 hour(s))  Culture, blood (routine x 2)     Status: None (Preliminary result)   Collection Time: 10/17/15 10:20 PM  Result Value Ref Range Status   Specimen Description BLOOD LEFT HAND  Final   Special Requests BOTTLES DRAWN AEROBIC ONLY [place]  Final   Culture NO GROWTH 3 DAYS  Final   Report Status PENDING  Incomplete  Culture, blood (routine x 2)     Status: None (Preliminary result)   Collection Time: 10/17/15 10:28 PM  Result Value Ref Range Status   Specimen Description BLOOD RIGHT ANTECUBITAL  Final   Special Requests BOTTLES DRAWN AEROBIC AND ANAEROBIC 5CC   Final   Culture NO GROWTH 3 DAYS  Final   Report Status PENDING  Incomplete    RADIOLOGY STUDIES/RESULTS: Ct Angio Head W/cm &/or Wo Cm  10/17/2015  CLINICAL DATA:  Followup stroke symptoms.  History of hypertension. EXAM: CT ANGIOGRAPHY HEAD AND NECK TECHNIQUE: Multidetector CT imaging of the head and neck was performed using the standard protocol during bolus administration of intravenous contrast. Multiplanar CT image reconstructions and MIPs were obtained to evaluate the vascular anatomy. Carotid stenosis measurements (when applicable) are obtained utilizing  NASCET criteria, using the distal internal carotid diameter as the denominator. CONTRAST:  139mL OMNIPAQUE IOHEXOL 350 MG/ML SOLN COMPARISON:  MRI of the brain October 17, 2015 at 1838 hours. FINDINGS: CTA NECK Aortic arch: Normal appearance of the thoracic arch, normal branch pattern. The origins of the innominate, left Common carotid artery and subclavian artery are widely patent. Right carotid system: Common carotid artery is widely patent, coursing in a straight line fashion. Normal appearance of the carotid bifurcation without hemodynamically significant stenosis by NASCET criteria. Minimal eccentric calcific atherosclerosis and intimal thickening. Normal  appearance of the included internal carotid artery. Left carotid system: Common carotid artery is widely patent, coursing in a straight line fashion. Normal appearance of the carotid bifurcation without hemodynamically significant stenosis by NASCET criteria. Minimal eccentric calcific atherosclerosis and intimal thickening. Normal appearance of the included internal carotid artery. Vertebral arteries:Left vertebral artery is dominant. Normal appearance of the vertebral arteries, which appear widely patent. Skeleton: No acute osseous process though bone windows have not been submitted. Patient is edentulous. 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 No destructive bony lesions. Other neck: Soft tissues of the neck are nonacute though, not tailored for evaluation. Included heart appears mildly enlarged. Prominent main pulmonary artery, incompletely imaged. Calcified hilar lymph nodes. Atrophic versus resected LEFT submandibular gland. Centrally edematous RIGHT submandibular gland without sialolith or inflammatory changes. CTA HEAD Anterior circulation: Patent cervical internal carotid arteries, petrous, cavernous and supra clinoid internal carotid arteries. Moderate calcific atherosclerosis of the carotid siphons. Widely patent anterior communicating  artery. Normal appearance of the anterior and middle cerebral arteries. Supernumerary anterior cerebral artery arising from LEFT A1-2 junction. Posterior circulation: Normal appearance of the vertebral arteries, vertebrobasilar junction and basilar artery, as well as main branch vessels. Bilateral posterior communicating arteries contribute to posterior circulation. Normal appearance of the posterior cerebral arteries. No large vessel occlusion, hemodynamically significant stenosis, dissection, luminal irregularity, contrast extravasation or aneurysm within the anterior nor posterior circulation. No abnormal intracranial enhancement on delayed phase. IMPRESSION: CT HEAD: No abnormal intracranial enhancement. CTA NECK: No acute vascular process or hemodynamically significant stenosis. CTA HEAD: No acute large vessel occlusion or high-grade stenosis. Complete circle of Willis. Electronically Signed   By: Elon Alas M.D.   On: 10/17/2015 21:46   Ct Head Wo Contrast  10/17/2015  ADDENDUM REPORT: 10/17/2015 16:40 ADDENDUM: Study discussed by telephone with Dr. Wallie Char on 10/17/2015 at 1624 hours. Electronically Signed   By: Genevie Ann M.D.   On: 10/17/2015 16:40  10/17/2015  CLINICAL DATA:  65 year old female code stroke. Right side weakness and abnormal speech. Last seen normal 1510 hours. Initial encounter. EXAM: CT HEAD WITHOUT CONTRAST TECHNIQUE: Contiguous axial images were obtained from the base of the skull through the vertex without intravenous contrast. COMPARISON:  None. FINDINGS: Visualized paranasal sinuses and mastoids are clear. No acute osseous abnormality identified. Visualized orbits and scalp soft tissues are within normal limits. Calcified atherosclerosis at the skull base.  Dural calcifications. Asymmetric hyperdensity at the left ICA terminus on series 201, image 7. No cortically based acute infarct identified. No acute intracranial hemorrhage identified. No midline shift, mass  effect, or evidence of intracranial mass lesion. Gray-white matter differentiation is within normal limits throughout the brain. IMPRESSION: Suspicious asymmetric hyperdensity at the left ICA terminus, but otherwise normal for age noncontrast CT appearance of the brain. Electronically Signed: By: Genevie Ann M.D. On: 10/17/2015 16:20   Ct Angio Neck W/cm &/or Wo/cm  10/17/2015  CLINICAL DATA:  Followup stroke symptoms.  History of hypertension. EXAM: CT ANGIOGRAPHY HEAD AND NECK TECHNIQUE: Multidetector CT imaging of the head and neck was performed using the standard protocol during bolus administration of intravenous contrast. Multiplanar CT image reconstructions and MIPs were obtained to evaluate the vascular anatomy. Carotid stenosis measurements (when applicable) are obtained utilizing NASCET criteria, using the distal internal carotid diameter as the denominator. CONTRAST:  119mL OMNIPAQUE IOHEXOL 350 MG/ML SOLN COMPARISON:  MRI of the brain October 17, 2015 at 1838 hours. FINDINGS: CTA NECK Aortic arch: Normal appearance of the thoracic arch, normal branch pattern.  The origins of the innominate, left Common carotid artery and subclavian artery are widely patent. Right carotid system: Common carotid artery is widely patent, coursing in a straight line fashion. Normal appearance of the carotid bifurcation without hemodynamically significant stenosis by NASCET criteria. Minimal eccentric calcific atherosclerosis and intimal thickening. Normal appearance of the included internal carotid artery. Left carotid system: Common carotid artery is widely patent, coursing in a straight line fashion. Normal appearance of the carotid bifurcation without hemodynamically significant stenosis by NASCET criteria. Minimal eccentric calcific atherosclerosis and intimal thickening. Normal appearance of the included internal carotid artery. Vertebral arteries:Left vertebral artery is dominant. Normal appearance of the vertebral  arteries, which appear widely patent. Skeleton: No acute osseous process though bone windows have not been submitted. Patient is edentulous. 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 No destructive bony lesions. Other neck: Soft tissues of the neck are nonacute though, not tailored for evaluation. Included heart appears mildly enlarged. Prominent main pulmonary artery, incompletely imaged. Calcified hilar lymph nodes. Atrophic versus resected LEFT submandibular gland. Centrally edematous RIGHT submandibular gland without sialolith or inflammatory changes. CTA HEAD Anterior circulation: Patent cervical internal carotid arteries, petrous, cavernous and supra clinoid internal carotid arteries. Moderate calcific atherosclerosis of the carotid siphons. Widely patent anterior communicating artery. Normal appearance of the anterior and middle cerebral arteries. Supernumerary anterior cerebral artery arising from LEFT A1-2 junction. Posterior circulation: Normal appearance of the vertebral arteries, vertebrobasilar junction and basilar artery, as well as main branch vessels. Bilateral posterior communicating arteries contribute to posterior circulation. Normal appearance of the posterior cerebral arteries. No large vessel occlusion, hemodynamically significant stenosis, dissection, luminal irregularity, contrast extravasation or aneurysm within the anterior nor posterior circulation. No abnormal intracranial enhancement on delayed phase. IMPRESSION: CT HEAD: No abnormal intracranial enhancement. CTA NECK: No acute vascular process or hemodynamically significant stenosis. CTA HEAD: No acute large vessel occlusion or high-grade stenosis. Complete circle of Willis. Electronically Signed   By: Elon Alas M.D.   On: 10/17/2015 21:46   Mr Brain Wo Contrast  10/17/2015  CLINICAL DATA:  Code stroke status with new onset of right-sided weakness. EXAM: MRI HEAD WITHOUT CONTRAST TECHNIQUE: Multiplanar, multiecho  pulse sequences of the brain and surrounding structures were obtained without intravenous contrast. COMPARISON:  Head CT same day FINDINGS: No abnormality is seen affecting the brainstem. No acute infarction affecting the cerebellum. I think there are a few old small vessel infarctions within the cerebellum. Cerebral hemispheres show background pattern of a few small foci of T2 and FLAIR signal in the white matter suggesting mild small vessel disease. There are scattered punctate acute infarctions in both hemispheres, much more extensive on the left than the right, largely in a watershed distribution. These could represent a shower of micro embolic emboli or could actually relate to watershed infarction. No large confluent infarction. No mass effect or gross hemorrhage. No hydrocephalus. No extra-axial collection. No pituitary mass. No inflammatory sinus disease. No skull or skullbase lesion. Major vessels at the base of the brain show flow. IMPRESSION: Innumerable punctate infarctions affecting both cerebral hemispheres, much more extensive on the left than the right, which could be due to a shower of micro emboli or watershed infarctions. No large confluent infarction. No mass effect or hemorrhage. Electronically Signed   By: Nelson Chimes M.D.   On: 10/17/2015 19:09    Oren Binet, MD  [facility] Pager:[phone]  If 7PM-7AM, please contact night-coverage [email or web address] Password [email or web address] 10/21/2015, 2:12 PM   LOS: 4 days

## 2015-10-21 NOTE — Consult Note (Signed)
[address]       [place] [address]             [phone]        Pricilla Hodkinson [place] Medical Record [id number] Date of Birth: [birth year]  Referring: No ref. provider found Primary Care: No primary care provider on file.  Chief Complaint:    Chief Complaint  Patient presents with  . Code Stroke   patient examined, echocardiograms and had CT scans reviewed  History of Present Illness:     65 year old [place] female hypertensive smoker without history of cardiac disease presented to the ED December 29 with right-sided weakness and facial droop. Her neurologic deficits have improved but not totally reversed. She is now ambulatory, able to tolerate a regular diet, and with mild right-sided weakness. Brain scan showed evidence of embolic infarcts. 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 Biventricular function is normal. There are no significant valvular  abnormalities. She has no family history of cardiac  disease. She has no history of rheumatic fever or a cardiac murmur in her childhood. The patient is edentulous with full dental plates. She is right-hand dominant.  The patient's only previous hospital admission was for fractured left lower leg which required orthopedic surgery under general anesthesia which she tolerated without complication.  The patient lives with her daughter and grandchildren. She smokes a pack a day and is not very ambulatory. She is retired from working in [employer] job. She takes medications for anxiety-depression.   Current Activity/ Functional Status: Patient is ambulatory and receives assistance with ADLs. She is receiving physical therapy and speech therapy   Zubrod Score: At the time of surgery this patient's most appropriate activity status/level should be described as: []     0    Normal activity, no symptoms []     1    Restricted in physical strenuous activity  but ambulatory, able to do out light work []     2    Ambulatory and capable of self care, unable to do work activities, up and about                 more than 50%  Of the time                            [x]     3    Only limited self care, in bed greater than 50% of waking hours []     4    Completely disabled, no self care, confined to bed or chair []     5    Moribund  Past Medical History  Diagnosis Date  . Hypertension   . Anxiety   . Depression   . Arthritis   . GERD (gastroesophageal reflux disease)   . Chronic back pain   . Wears dentures     top-partial bottom  . Wears glasses   . Fibromyalgia     Past Surgical History  Procedure Laterality Date  . Submandibular gland excision      left  . Abdominal hysterectomy    . Tonsillectomy    . Bunionectomy      both feet  . Fracture surgery      fx lt ankle    History  Smoking status  . Current Every Day Smoker -- 0.50 packs/day  Smokeless tobacco  . Not on file  History  Alcohol Use No    Social History   Social History  . Marital Status: Single    Spouse Name: N/A  . Number of Children: N/A  . Years of Education: N/A   Occupational History  . Not on file.   Social History Main Topics  . Smoking status: Current Every Day Smoker -- 0.50 packs/day  . Smokeless tobacco: Not on file  . Alcohol Use: No  . Drug Use: No  . Sexual Activity: Not on file   Other Topics Concern  . Not on file   Social History Narrative    Allergies  Allergen Reactions  . Ultram [Tramadol] Hives    Current Facility-Administered Medications  Medication Dose Route Frequency Provider Last Rate Last Dose  . atorvastatin (LIPITOR) tablet 20 mg  20 mg Oral q1800 Rosalin Hawking, MD   20 mg at 10/20/15 1756  . busPIRone (BUSPAR) tablet 15 mg  15 mg Oral BID Etta Quill, DO   15 mg at 10/21/15 1112  . DULoxetine (CYMBALTA) DR capsule 60 mg  60 mg Oral Daily Etta Quill, DO   60 mg at 10/21/15 1113  . heparin ADULT infusion  100 units/mL (25000 units/250 mL)  800 Units/hr Intravenous Continuous Franky Macho, RPH 8 mL/hr at 10/21/15 1112 800 Units/hr at 10/21/15 1112  . HYDROcodone-acetaminophen (NORCO) 10-325 MG per tablet 1 tablet  1 tablet Oral Q6H PRN Etta Quill, DO   1 tablet at 10/20/15 2152  . pantoprazole (PROTONIX) EC tablet 80 mg  80 mg Oral Daily Etta Quill, DO   80 mg at 10/21/15 1113  . pregabalin (LYRICA) capsule 150 mg  150 mg Oral Daily Etta Quill, DO   150 mg at 10/21/15 1113  . pregabalin (LYRICA) capsule 75 mg  75 mg Oral QHS Etta Quill, DO   75 mg at 10/20/15 2152  . tiZANidine (ZANAFLEX) tablet 4 mg  4 mg Oral TID PRN Etta Quill, DO      . zolpidem (AMBIEN) tablet 10 mg  10 mg Oral QHS Etta Quill, DO   10 mg at 10/20/15 2152    Prescriptions prior to admission  Medication Sig Dispense Refill Last Dose  . amLODipine-benazepril (LOTREL) 10-40 MG per capsule Take 1 capsule by mouth daily.   10/17/2015 at Unknown time  . busPIRone (BUSPAR) 15 MG tablet Take 15 mg by mouth 2 (two) times daily.    10/17/2015 at Unknown time  . diclofenac (VOLTAREN) 75 MG EC tablet Take 75 mg by mouth 2 (two) times daily.   10/17/2015 at Unknown time  . DULoxetine (CYMBALTA) 60 MG capsule Take 60 mg by mouth daily.   10/17/2015 at Unknown time  . furosemide (LASIX) 20 MG tablet Take 20 mg by mouth daily.   10/17/2015 at Unknown time  . HYDROcodone-acetaminophen (NORCO) 10-325 MG per tablet Take 1 tablet by mouth every 6 (six) hours as needed for moderate pain or severe pain.    unknown  . mirtazapine (REMERON) 15 MG tablet Take 15 mg by mouth at bedtime.   10/16/2015 at Unknown time  . omeprazole (PRILOSEC) 10 MG capsule Take 10 mg by mouth daily.   10/17/2015 at Unknown time  . omeprazole (PRILOSEC) 20 MG capsule Take 20 mg by mouth daily.   10/17/2015 at Unknown time  . pregabalin (LYRICA) 75 MG capsule Take 75-150 mg by mouth 2 (two) times daily. 150mg  in the morning and 75mg  in the  evening   10/17/2015 at Unknown time  . tiZANidine (ZANAFLEX) 4 MG capsule Take 4 mg by mouth 3 (three) times daily as needed for muscle spasms.    unknown  . zolpidem (AMBIEN) 10 MG tablet Take 10 mg by mouth at bedtime.    10/16/2015 at Unknown time    History reviewed. No pertinent family history.   Review of Systems:       Cardiac Review of Systems: Y or N  Chest Pain [  no  ]  Resting SOB [ no  ] Exertional SOB  [ yes ]  Orthopnea [ no ]   Pedal Edema [   no]    Palpitations [no  ] Syncope  [  ]   Presyncope [   ]  General Review of Systems: [Y] = yes [  ]=no Constitional: recent weight change [  ]; anorexia [  ]; fatigue [  ]; nausea [  ]; night sweats [  ]; fever [  ]; or chills [  ]                                                               Dental: poor dentition[ full dental plates ]; Last Dentist visit: Greater than one year  Eye : blurred vision [  ]; diplopia [   ]; vision changes [  ];  Amaurosis fugax[  ]; Resp: cough [  ];  wheezing[  ];  hemoptysis[  ]; shortness of breath[ yes-mild ]; paroxysmal nocturnal dyspnea[  ]; dyspnea on exertion[ yes-mild ]; or orthopnea[  ];  GI:  gallstones[  ], vomiting[  ];  dysphagia[  ]; melena[  ];  hematochezia [  ]; heartburn[  ];   Hx of  Colonoscopy[  ]; positive symptoms of GERD GU: kidney stones [  ]; hematuria[  ];   dysuria [  ];  nocturia[  ];  history of     obstruction [  ]; urinary frequency [  ]             Skin: rash, swelling[  ];, hair loss[  ];  peripheral edema[  ];  or itching[  ]; Musculosketetal: myalgias[  ];  joint swelling[  ];  joint erythema[  ];  joint pain[  ];  back pain[  ];  Heme/Lymph: bruising[  ];  bleeding[  ];  anemia[  ];  Neuro: TIA[  ];  headaches[  ];  stroke[ yes ];  vertigo[  ];  seizures[  ];   paresthesias[  ];  difficulty walking[  ];  Psych:depression[ yes ]; anxiety[yes  ];  Endocrine: diabetes[  ];  thyroid dysfunction[  ];  Immunizations: Flu [  ]; Pneumococcal[  ];  Other:  Physical  Exam: BP 99/72 mmHg  Pulse 85  Temp(Src) 98.8 F (37.1 C) (Oral)  Resp 20  Ht 5\' 1"  (1.549 m)  Wt 163 lb 11.2 oz (74.254 kg)  BMI 30.95 kg/m2  SpO2 99%      Physical Exam  General: Small middle-aged  AA female in her hospital room in no distress HEENT: Normocephalic pupils equal , dentition with full plates Neck: Supple without JVD, adenopathy, or bruit Chest: Clear to auscultation, symmetrical breath sounds, no rhonchi, no tenderness  or deformity Cardiovascular: Regular rate and rhythm, no murmur, no gallop, peripheral pulses             palpable in all extremities Abdomen:  Soft, nontender, no palpable mass or organomegaly Extremities: Warm, well-perfused, no clubbing cyanosis edema or tenderness,              no venous stasis changes of the legs Rectal/GU: Deferred Neuro: Grossly non--focal  but with slight right facial droop slight weakness 4/5 of right upper extremity , otherwise symmetrical throughout Skin: Clean and dry without rash or ulceration    Diagnostic Studies & Laboratory data:     Recent Radiology Findings:   No results found.   I have independently reviewed the above radiologic studies.  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 Patient receiving physical therapy and IV heparin and is showing good recovery.  The patient will need cardiac surgery for resection of left atrial myxoma in the near future before she  sustains a more serious stroke.  Recommend left heart catheterization next week, continue IV heparin and physical therapy and we'll schedule surgery for January 13. Plan discussed with patient and family and she understands and agrees to proceed.

## 2015-10-21 NOTE — Care Management Note (Signed)
Case Management Note  Patient Details  Name: Tresea Zasada MRN: [id number] Date of Birth: Nov 05, 1950  Subjective/Objective:    Patient admitted with embolic CVA. Patient also has a tumor on her Lt atrial septum. Pt on IV Heparin and plan is to do a cath next week and then plan for open heart sgy on Jan 13th. Patient is from home with family.                 Action/Plan: PT recommending CIR. Patient is considering CIR. Will continue to follow for discharge needs.   Expected Discharge Date:                  Expected Discharge Plan:     In-House Referral:     Discharge planning Services     Post Acute Care Choice:    Choice offered to:     DME Arranged:    DME Agency:     HH Arranged:    HH Agency:     Status of Service:  In process, will continue to follow  Medicare Important Message Given:  Yes Date Medicare IM Given:    Medicare IM give by:    Date Additional Medicare IM Given:    Additional Medicare Important Message give by:     If discussed at [place] of Stay Meetings, dates discussed:    Additional Comments:  Pollie Friar, RN 10/21/2015, 4:00 PM

## 2015-10-21 NOTE — Consult Note (Signed)
Physical Medicine and Rehabilitation Consult Reason for Consult: Bilateral left greater than right punctate infarcts, embolic secondary to intracardial lesion Referring Physician: [facility]   HPI: Demitria Klinglesmith is a 65 y.o. right handed female with history of hypertension, tobacco abuse, fibromyalgia. Patient with family and assistance as needed. Independent prior to admission. 2 level home with bedroom downstairs. Her family will be able to provide 24/7 discharge. Presented 10/17/2015 with acute onset of right sided weakness. Cranial CT scans suspicious asymmetric hyperdensity at the left ICA terminus otherwise normal study. MRI of the brain showed a innumerable punctate infarcts affecting both cerebral hemispheres, much more extensive on the left than right. CT angiogram of head and neck with no large vessel occlusion or stenosis. Patient did not receive TPA. Echocardiogram with ejection fraction of 55% and no wall motion abnormality. Question mass in LA. TEE showed larger mobile echodensity attached to the superior anterior portion of the intra-atrial septum from the left atrial side measuring 27 x 29 mm. 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 Placed on heparin therapy. Cardiology consulted plan to discuss with cardiothoracic surgery on plan of care. Tolerating a regular consistency diet. Physical therapy evaluation completed 10/19/2014 with recommendations of physical medicine rehabilitation consult.   Review of Systems  Constitutional: Negative for fever and chills.  HENT: Negative for hearing loss.   Eyes: Negative for blurred vision and double vision.  Respiratory: Negative for cough and shortness of breath.   Cardiovascular: Negative for chest pain and palpitations.  Gastrointestinal: Positive for constipation. Negative for nausea and vomiting.       GERD  Genitourinary: Negative for dysuria and hematuria.  Musculoskeletal: Positive for myalgias, back pain and joint pain.  Skin: Negative for  rash.  Neurological: Positive for focal weakness (Occasional). Negative for seizures, loss of consciousness and headaches.  Psychiatric/Behavioral: Positive for depression.       Anxiety  All other systems reviewed and are negative.  Past Medical History  Diagnosis Date  . Hypertension   . Anxiety   . Depression   . Arthritis   . GERD (gastroesophageal reflux disease)   . Chronic back pain   . Wears dentures     top-partial bottom  . Wears glasses   . Fibromyalgia    Past Surgical History  Procedure Laterality Date  . Submandibular gland excision      left  . Abdominal hysterectomy    . Tonsillectomy    . Bunionectomy      both feet  . Fracture surgery      fx lt ankle   History reviewed. No pertinent family history. Social History:  reports that she has been smoking.  She does not have any smokeless tobacco history on file. She reports that she does not drink alcohol or use illicit drugs. Allergies:  Allergies  Allergen Reactions  . Ultram [Tramadol] Hives   Medications Prior to Admission  Medication Sig Dispense Refill  . amLODipine-benazepril (LOTREL) 10-40 MG per capsule Take 1 capsule by mouth daily.    . busPIRone (BUSPAR) 15 MG tablet Take 15 mg by mouth 2 (two) times daily.     . diclofenac (VOLTAREN) 75 MG EC tablet Take 75 mg by mouth 2 (two) times daily.    . DULoxetine (CYMBALTA) 60 MG capsule Take 60 mg by mouth daily.    . furosemide (LASIX) 20 MG tablet Take 20 mg by mouth daily.    Marland Kitchen HYDROcodone-acetaminophen (NORCO) 10-325 MG per tablet Take 1 tablet by mouth every 6 (  six) hours as needed for moderate pain or severe pain.     . mirtazapine (REMERON) 15 MG tablet Take 15 mg by mouth at bedtime.    Marland Kitchen omeprazole (PRILOSEC) 10 MG capsule Take 10 mg by mouth daily.    Marland Kitchen omeprazole (PRILOSEC) 20 MG capsule Take 20 mg by mouth daily.    . pregabalin (LYRICA) 75 MG capsule Take 75-150 mg by mouth 2 (two) times daily. 150mg  in the morning and 75mg  in the  evening    . tiZANidine (ZANAFLEX) 4 MG capsule Take 4 mg by mouth 3 (three) times daily as needed for muscle spasms.     Marland Kitchen zolpidem (AMBIEN) 10 MG tablet Take 10 mg by mouth at bedtime.       Home: Home Living Family/patient expects to be discharged to:: Private residence Living Arrangements: Children (brother, daughter, son, grandchild) Available Help at Discharge: Family, Available 24 hours/day Type of Home: House Home Access: Stairs to enter Technical brewer of Steps: 3 Entrance Stairs-Rails: Right, Left Home Layout: Two level, Able to live on main level with bedroom/bathroom Home Equipment: Environmental consultant - 2 wheels, Shower seat, Wheelchair - power Additional Comments: Pt reports she purchased a power WC a few years ago for no reason other than to have it in case she would one day need it  Lives With: Family, Son, Daughter  Functional History: Prior Function Level of Independence: Independent Functional Status:  Mobility: Bed Mobility Overal bed mobility: Needs Assistance Bed Mobility: Supine to Sit, Sit to Supine Supine to sit: Min assist Sit to supine: Min guard General bed mobility comments: Min assist using bed pad to direct pelvis.  Pt uses bed rail and requires increased time and verbal cues for technique. Min guard assist to return to supine w/ increased time and verbal cues Transfers Overall transfer level: Needs assistance Equipment used: 1 person hand held assist Transfers: Sit to/from Stand Sit to Stand: Min assist General transfer comment: Min assist to boost up to stand and for stability.  Cues for reaching back when sitting. Ambulation/Gait Ambulation/Gait assistance: Mod assist Ambulation Distance (Feet): 50 Feet Assistive device: 1 person hand held assist Gait Pattern/deviations: Step-through pattern, Decreased dorsiflexion - right, Decreased stride length, Antalgic, Drifts right/left General Gait Details: Pt drifting to Rt and requires verbal cues to avoid  MD in hallway.  Mod assist for stability, pt WB through Lt UE for support.  Rt foot drop notetd. Gait velocity interpretation: <1.8 ft/sec, indicative of risk for recurrent falls    ADL:    Cognition: Cognition Overall Cognitive Status: Impaired/Different from baseline Arousal/Alertness: Lethargic Orientation Level: Oriented X4 Attention: Focused, Sustained Focused Attention: Impaired Sustained Attention: Impaired Memory: Impaired Memory Impairment: Decreased recall of new information, Decreased short term memory Decreased Short Term Memory: Verbal basic Awareness: Impaired Problem Solving: Impaired Problem Solving Impairment: Functional basic Executive Function: Self Monitoring, Initiating, Reasoning Reasoning: Impaired Initiating: Impaired Self Monitoring: Impaired Safety/Judgment: Impaired Cognition Arousal/Alertness: Lethargic Behavior During Therapy: Flat affect Overall Cognitive Status: Impaired/Different from baseline Area of Impairment: Orientation, Memory, Safety/judgement, Awareness, Problem solving Orientation Level: Disoriented to, Situation (struggling to remember she has had a stroke) Memory: Decreased short-term memory Safety/Judgement: Decreased awareness of safety, Decreased awareness of deficits Awareness: Emergent Problem Solving: Slow processing, Requires verbal cues General Comments: Counting backward from 100 by 7's: "93, 87, 77, 67, 57".    Blood pressure 124/77, pulse 74, temperature 98.4 F (36.9 C), temperature source Axillary, resp. rate 18, height 5\' 1"  (1.549 m), weight 74.254 kg (  163 lb 11.2 oz), SpO2 100 %. Physical Exam  Vitals reviewed. Constitutional: She is oriented to person, place, and time. She appears well-developed and well-nourished.  HENT:  Head: Normocephalic and atraumatic.  Eyes: Conjunctivae are normal. Left eye exhibits no discharge. No scleral icterus.  Neck: Normal range of motion. Neck supple. No thyromegaly present.    Cardiovascular: Normal rate and regular rhythm.   Respiratory: Effort normal and breath sounds normal. No respiratory distress.  GI: Soft. Bowel sounds are normal. She exhibits no distension.  Musculoskeletal: She exhibits no edema or tenderness.  Neurological: She is alert and oriented to person, place, and time.  Follows commands.  Fair awareness of deficits. Sensation intact to light touch Motor LUE, B/LLE: 5/5 proximal to distal R UE 4+-5/5 Difficulty with EOMI  3+ DTR R UE  Skin: Skin is warm and dry.  Psychiatric: She has a normal mood and affect. Her behavior is normal.    Results for orders placed or performed during the hospital encounter of 10/17/15 (from the past 24 hour(s))  Heparin level (unfractionated)     Status: None   Collection Time: 10/20/15  8:33 AM  Result Value Ref Range   Heparin Unfractionated 0.51 0.30 - 0.70 IU/mL  Heparin level (unfractionated)     Status: None   Collection Time: 10/21/15  3:45 AM  Result Value Ref Range   Heparin Unfractionated 0.47 0.30 - 0.70 IU/mL  CBC     Status: Abnormal   Collection Time: 10/21/15  3:45 AM  Result Value Ref Range   WBC 4.7 4.0 - 10.5 K/uL   RBC 3.84 (L) 3.87 - 5.11 MIL/uL   Hemoglobin 11.0 (L) 12.0 - 15.0 g/dL   HCT 33.8 (L) 36.0 - 46.0 %   MCV 88.0 78.0 - 100.0 fL   MCH 28.6 26.0 - 34.0 pg   MCHC 32.5 30.0 - 36.0 g/dL   RDW 13.4 11.5 - 15.5 %   Platelets 240 150 - 400 K/uL   No results found.  Assessment/Plan: Diagnosis: Bilateral left greater than right punctate infarcts, embolic secondary to intracardial lesion Labs and images independently reviewed.  Records reviewed and summated above. Stroke: Continue secondary stroke prophylaxis and Risk Factor Modification listed below:   Antiplatelet therapy Blood Pressure Management:  Continue current medication with prn's with permisive HTN per primary team Statin Agent  Diabetes management Tobacco abuse:  Cont to counsel  1. Does the need for close,  24 hr/day medical supervision in concert with the patient's rehab needs make it unreasonable for this patient to be served in a less intensive setting? Yes  2. Co-Morbidities requiring supervision/potential complications: HTN (monitor and provide prns in accordance with increased physical exertion and pain), tobacco abuse (cont to counsel), fibromyalgia (cont meds, ensure pain does not limit functional progress), anemia (transfuse if necessary to ensure appropriate perfusion for increased activity tolerance), hypomagnesemia (cont to replete and monitor) 3. Due to safety, disease management and patient education, does the patient require 24 hr/day rehab nursing? Yes 4. Does the patient require coordinated care of a physician, rehab nurse, PT (1-2 hrs/day, 5 days/week) and OT (1-2 hrs/day, 5 days/week) to address physical and functional deficits in the context of the above medical diagnosis(es)? Yes Addressing deficits in the following areas: balance, endurance, locomotion and psychosocial support 5. Can the patient actively participate in an intensive therapy program of at least 3 hrs of therapy per day at least 5 days per week? Yes interesting over the weekend have recalls 4  constipation but not for fevers observation6 6. The potential for patient to make measurable gains while on inpatient rehab is excellent 7. Anticipated functional outcomes upon discharge from inpatient rehab are modified independent  with PT, independent with OT, n/a with SLP. 8. Estimated rehab length of stay to reach the above functional goals is: 7-11 days. 9. Does the patient have adequate social supports and living environment to accommodate these discharge functional goals? Yes 10. Anticipated D/C setting: Home 11. Anticipated post D/C treatments: HH therapy and Home excercise program 12. Overall Rehab/Functional Prognosis: excellent  RECOMMENDATIONS: This patient's condition is appropriate for continued rehabilitative care  in the following setting: Would recommend CIR, however, pt is currently refusing.  If pt chooses not to participate in rehab would recommend home with [facility] with follow with PM&R clinic on discharge. Patient has agreed to participate in recommended program. No Note that insurance prior authorization may be required for reimbursement for recommended care.  Comment: Rehab Admissions Coordinator to follow up.  Delice Lesch, MD 10/21/2015

## 2015-10-21 NOTE — Progress Notes (Signed)
Patient ID: Julia Schwartz, female   DOB: July 30, 1951, 65 y.o.   MRN: [id number]    SUBJECTIVE:  She denies chest pain or SOB   PHYSICAL EXAM Filed Vitals:   10/20/15 1737 10/20/15 2149 10/21/15 0110 10/21/15 0510  BP: 122/76 125/80 127/77 124/77  Pulse: 80 72 77 74  Temp: 99.1 F (37.3 C) 98.3 F (36.8 C) 98.6 F (37 C) 98.4 F (36.9 C)  TempSrc: Oral Oral Oral Axillary  Resp: 18 18 18 18   Height:      Weight:      SpO2: 98% 98% 100% 100%   General:  No distress Lungs:  Clear Heart:  RRR Abdomen:  Positive bowel sounds, no rebound no guarding Extremities:  No edema Neuro:  Right sided weakness  LABS: No results found for: TROPONINI Results for orders placed or performed during the hospital encounter of 10/17/15 (from the past 24 hour(s))  Heparin level (unfractionated)     Status: None   Collection Time: 10/21/15  3:45 AM  Result Value Ref Range   Heparin Unfractionated 0.47 0.30 - 0.70 IU/mL  CBC     Status: Abnormal   Collection Time: 10/21/15  3:45 AM  Result Value Ref Range   WBC 4.7 4.0 - 10.5 K/uL   RBC 3.84 (L) 3.87 - 5.11 MIL/uL   Hemoglobin 11.0 (L) 12.0 - 15.0 g/dL   HCT 33.8 (L) 36.0 - 46.0 %   MCV 88.0 78.0 - 100.0 fL   MCH 28.6 26.0 - 34.0 pg   MCHC 32.5 30.0 - 36.0 g/dL   RDW 13.4 11.5 - 15.5 %   Platelets 240 150 - 400 K/uL    Intake/Output Summary (Last 24 hours) at 10/21/15 0847 Last data filed at 10/20/15 1700  Gross per 24 hour  Intake    360 ml  Output      0 ml  Net    360 ml     ASSESSMENT AND PLAN:  CARDIAC MASS:   This would be the cause of the stroke.  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  MRI was poor quality.  She is on heparin. I discussed with Dr Prescott Gum today He had not been Notified yet.  Given CVA will likely need at least 2 weeks rehab before going on pump.  Will likely need cath given age to r/o CAD   Jenkins Rouge 10/21/2015 8:47 AM

## 2015-10-21 NOTE — Progress Notes (Signed)
ANTICOAGULATION CONSULT NOTE - Follow-up Consult  Pharmacy Consult for Heparin Indication: stroke  Patient Measurements: Height: 5\' 1"  (154.9 cm) Weight: 163 lb 11.2 oz (74.254 kg) IBW/kg (Calculated) : 47.8 Heparin Dosing Weight: 64.1 kg  Vital Signs: Temp: 98.8 F (37.1 C) (01/02 0947) Temp Source: Oral (01/02 0947) BP: 99/72 mmHg (01/02 0947) Pulse Rate: 85 (01/02 0947)  Labs:  Recent Labs  10/19/15 0250  10/20/15 0305 10/20/15 0833 10/21/15 0345  HGB 10.5*  --  10.9*  --  11.0*  HCT 32.4*  --  32.7*  --  33.8*  PLT 237  --  215  --  240  HEPARINUNFRC  --   < > 0.49 0.51 0.47  CREATININE 0.85  --   --   --   --   < > = values in this interval not displayed.   Assessment: 65 yo F presented to ED 12/29 with new R sided weakness. MRI showed bilateral innumerable punctate acute infarcts consistent with embolic showering. 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  Heparin level therapeutic today  Goal of Therapy:  Heparin level 0.3-0.5 units/ml Monitor platelets by anticoagulation protocol: Yes   Plan:  1. Continue heparin infusion at 800 units/hr as current rate has been maintaining fairly steady levels with last few checks 2. HL in am and daily  Thank you Anette Guarneri, PharmD [phone] 10/21/2015, 10:14 AM

## 2015-10-22 ENCOUNTER — Encounter (HOSPITAL_COMMUNITY): Payer: Self-pay | Admitting: Cardiology

## 2015-10-22 ENCOUNTER — Other Ambulatory Visit: Payer: Self-pay | Admitting: *Deleted

## 2015-10-22 DIAGNOSIS — D151 Benign neoplasm of heart: Secondary | ICD-10-CM

## 2015-10-22 DIAGNOSIS — I6310 Cerebral infarction due to embolism of unspecified precerebral artery: Secondary | ICD-10-CM

## 2015-10-22 LAB — CBC
HCT: 34.3 % — ABNORMAL LOW (ref 36.0–46.0)
Hemoglobin: 11.2 g/dL — ABNORMAL LOW (ref 12.0–15.0)
MCH: 28.8 pg (ref 26.0–34.0)
MCHC: 32.7 g/dL (ref 30.0–36.0)
MCV: 88.2 fL (ref 78.0–100.0)
PLATELETS: 232 10*3/uL (ref 150–400)
RBC: 3.89 MIL/uL (ref 3.87–5.11)
RDW: 13.3 % (ref 11.5–15.5)
WBC: 4.7 10*3/uL (ref 4.0–10.5)

## 2015-10-22 LAB — ANTINUCLEAR ANTIBODIES, IFA: ANA Ab, IFA: NEGATIVE

## 2015-10-22 LAB — HEPARIN LEVEL (UNFRACTIONATED): Heparin Unfractionated: 0.47 IU/mL (ref 0.30–0.70)

## 2015-10-22 MED ORDER — CEFAZOLIN SODIUM-DEXTROSE 2-3 GM-% IV SOLR
INTRAVENOUS | Status: AC
  Filled 2015-10-22: qty 50

## 2015-10-22 NOTE — Progress Notes (Signed)
CARDIAC REHAB PHASE I   PT to see pt now. Will follow-up tomorrow for possible ambulation.   Lenna Sciara, RN, BSN 10/22/2015 2:52 PM

## 2015-10-22 NOTE — Progress Notes (Signed)
Inpatient Rehabilitation  I reviewed PT and OT notes from yesterday afternoon and spoke with Dr. Posey Pronto , our [facility] physician.  He indicates pt. Is currently too high level for CIR program.  I have discussed with the patient as well as her family at her bedside.  Pt. And family pleased that pt. Is doing better and agreeable that she can be managed at home with 24 hour assist and with home health therapies.  Pt. Will have cardiac cath tomorrow and resection of myxoma is scheduled for January 13th.  I have updated Jacqualin Combes, RNCM of the above.  I will sign off.  Please call if further needs arise.    [facility] Admissions Coordinator Cell [phone] Office [phone]

## 2015-10-22 NOTE — Plan of Care (Signed)
Problem: Nutrition: Goal: Dietary intake will improve Outcome: Not Met (add Reason) Patient does not have appetite, offered different snack options. Inquired if patient could get family to bring something she likes to eat, she stated that she didn't want anything.

## 2015-10-22 NOTE — Progress Notes (Signed)
PATIENT DETAILS Name: Julia Schwartz Age: 65 y.o. Sex: female Date of Birth: Apr 05, 1951 Admit Date: 10/17/2015 Admitting Physician Etta Quill, DO PCP:No primary care provider on file.  Subjective: Right-sided weakness essentially unchanged. 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 Cardiac MRI official results pending-but apparently a poor quality per cardiology note. Cardiology depending on cardiac cath tomorrow, evaluated by cardiothoracic surgery-tentatively scheduled for all are on Jan 13th. Have spoken with both cardiology and neurology-both are recommending Lovenox once she is completes all procedures.   Active Problems: Hypokalemia: Repleted,recheck periodically  HTN (hypertension): Allow mild permissive hypertension setting of acute CVA-continue to hold antihypertensives for now.  GERD: Continue PPI  Anxiety/depression: appears stable-continue with BuSpar, Cymbalta, Remeron  Disposition: Remain inpatient- plans are to discharge home with home health services after cardiac cath has been completed  Antimicrobial agents  See below  Anti-infectives    Start     Dose/Rate Route Frequency Ordered Stop   10/22/15 0653  ceFAZolin (ANCEF) 2-3 GM-% IVPB SOLR    Comments:  Ara Kussmaul   : cabinet override      10/22/15 0653 10/22/15 1859      DVT Prophylaxis: Heparin gtt  Code Status: Full code   Family Communication Multiple family members at bedside  Procedures: TEE 12/30  CONSULTS:  cardiology and neurology   CT VS  Time spent 25 minutes-Greater than 50% of this time was spent in counseling, explanation of diagnosis, planning of further management, and coordination of care.  MEDICATIONS: Scheduled Meds: . atorvastatin  20 mg Oral q1800  . busPIRone  15 mg Oral BID  .  ceFAZolin      . DULoxetine  60 mg Oral Daily  . pantoprazole  80 mg Oral Daily  . pregabalin  150 mg Oral Daily  . pregabalin  75 mg Oral QHS  . zolpidem  10 mg Oral QHS   Continuous Infusions: . heparin 800 Units/hr (10/21/15 1112)   PRN Meds:.HYDROcodone-acetaminophen, tiZANidine    PHYSICAL EXAM: Vital signs in last 24 hours: Filed Vitals:   10/22/15 0153 10/22/15 0538 10/22/15 0957 10/22/15 1319  BP: 118/79 127/77 129/77 118/78  Pulse: 77 79 96 85  Temp: 98.5 F (36.9 C) 98.6 F (37 C) 99.5 F (37.5 C) 98.4 F (36.9 C)  TempSrc: Oral Oral Oral Oral  Resp: 18 18 18 18   Height:      Weight:      SpO2: 98% 99% 98% 99%    Weight change:  Filed Weights   10/17/15 2153  Weight: 74.254 kg (163 lb 11.2 oz)   Body mass index is 30.95 kg/(m^2).   Gen Exam: Awake-slight dysarthria Neck: Supple, No JVD.   Chest: B/L Clear.   CVS: S1 S2 Regular, no murmurs.  Abdomen: soft, BS +, non tender, non distended.  Extremities: no edema, lower extremities warm to touch. Neurologic: RUE/RLE 4/5   Skin: No Rash.   Wounds: N/A.    Intake/Output from previous day: No intake or output data in the 24 hours ending 10/22/15 1430   LAB RESULTS: CBC  Recent Labs Lab 10/17/15 1606 10/17/15 1610 10/19/15 0250 10/20/15 0305 10/21/15 0345 10/22/15 0545  WBC 6.1  --  5.3 5.1 4.7 4.7  HGB 12.3 15.3* 10.5* 10.9* 11.0* 11.2*  HCT 39.4 45.0 32.4* 32.7* 33.8*  34.3*  PLT 208  --  237 215 240 232  MCV 92.1  --  89.5 88.9 88.0 88.2  MCH 28.7  --  29.0 29.6 28.6 28.8  MCHC 31.2  --  32.4 33.3 32.5 32.7  RDW 14.0  --  13.8 13.7 13.4 13.3  LYMPHSABS 3.3  --   --   --   --   --   MONOABS 0.7  --   --   --   --   --   EOSABS 0.2  --   --   --   --   --   BASOSABS 0.0  --   --   --   --   --     Chemistries   Recent Labs Lab 10/17/15 1606 10/17/15 1610 10/19/15 0250  NA 140 143 142  K 3.0* 2.9* 3.7  CL 109 108 115*  CO2 19*  --  20*  GLUCOSE 125* 122* 95  BUN 8 11 5*    CREATININE 1.17* 1.10* 0.85  CALCIUM 9.2  --  9.0  MG  --   --  1.5*    CBG:  Recent Labs Lab 10/17/15 1615  GLUCAP 108*    GFR Estimated Creatinine Clearance: 62.5 mL/min (by C-G formula based on Cr of 0.85).  Coagulation profile  Recent Labs Lab 10/17/15 1606  INR 1.08    Cardiac Enzymes No results for input(s): CKMB, TROPONINI, MYOGLOBIN in the last 168 hours.  Invalid input(s): CK  Invalid input(s): POCBNP No results for input(s): DDIMER in the last 72 hours. No results for input(s): HGBA1C in the last 72 hours. No results for input(s): CHOL, HDL, LDLCALC, TRIG, CHOLHDL, LDLDIRECT in the last 72 hours. No results for input(s): TSH, T4TOTAL, T3FREE, THYROIDAB in the last 72 hours.  Invalid input(s): FREET3 No results for input(s): VITAMINB12, FOLATE, FERRITIN, TIBC, IRON, RETICCTPCT in the last 72 hours. No results for input(s): LIPASE, AMYLASE in the last 72 hours.  Urine Studies No results for input(s): UHGB, CRYS in the last 72 hours.  Invalid input(s): UACOL, UAPR, USPG, UPH, UTP, UGL, UKET, UBIL, UNIT, UROB, ULEU, UEPI, UWBC, URBC, UBAC, CAST, UCOM, BILUA  MICROBIOLOGY: Recent Results (from the past 240 hour(s))  Culture, blood (routine x 2)     Status: None (Preliminary result)   Collection Time: 10/17/15 10:20 PM  Result Value Ref Range Status   Specimen Description BLOOD LEFT HAND  Final   Special Requests BOTTLES DRAWN AEROBIC ONLY [place]  Final   Culture NO GROWTH 4 DAYS  Final   Report Status PENDING  Incomplete  Culture, blood (routine x 2)     Status: None (Preliminary result)   Collection Time: 10/17/15 10:28 PM  Result Value Ref Range Status   Specimen Description BLOOD RIGHT ANTECUBITAL  Final   Special Requests BOTTLES DRAWN AEROBIC AND ANAEROBIC 5CC   Final   Culture NO GROWTH 4 DAYS  Final   Report Status PENDING  Incomplete    RADIOLOGY STUDIES/RESULTS: Ct Angio Head W/cm &/or Wo Cm  10/17/2015  CLINICAL DATA:  Followup stroke  symptoms.  History of hypertension. EXAM: CT ANGIOGRAPHY HEAD AND NECK TECHNIQUE: Multidetector CT imaging of the head and neck was performed using the standard protocol during bolus administration of intravenous contrast. Multiplanar CT image reconstructions and MIPs were obtained to evaluate the vascular anatomy. Carotid stenosis measurements (when applicable) are obtained utilizing NASCET criteria, using the distal internal carotid diameter as the denominator. CONTRAST:  193mL OMNIPAQUE IOHEXOL  350 MG/ML SOLN COMPARISON:  MRI of the brain October 17, 2015 at 1838 hours. FINDINGS: CTA NECK Aortic arch: Normal appearance of the thoracic arch, normal branch pattern. The origins of the innominate, left Common carotid artery and subclavian artery are widely patent. Right carotid system: Common carotid artery is widely patent, coursing in a straight line fashion. Normal appearance of the carotid bifurcation without hemodynamically significant stenosis by NASCET criteria. Minimal eccentric calcific atherosclerosis and intimal thickening. Normal appearance of the included internal carotid artery. Left carotid system: Common carotid artery is widely patent, coursing in a straight line fashion. Normal appearance of the carotid bifurcation without hemodynamically significant stenosis by NASCET criteria. Minimal eccentric calcific atherosclerosis and intimal thickening. Normal appearance of the included internal carotid artery. Vertebral arteries:Left vertebral artery is dominant. Normal appearance of the vertebral arteries, which appear widely patent. Skeleton: No acute osseous process though bone windows have not been submitted. Patient is edentulous. 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 No destructive bony lesions. Other neck: Soft tissues of the neck are nonacute though, not tailored for evaluation. Included heart appears mildly enlarged. Prominent main pulmonary artery, incompletely imaged. Calcified hilar  lymph nodes. Atrophic versus resected LEFT submandibular gland. Centrally edematous RIGHT submandibular gland without sialolith or inflammatory changes. CTA HEAD Anterior circulation: Patent cervical internal carotid arteries, petrous, cavernous and supra clinoid internal carotid arteries. Moderate calcific atherosclerosis of the carotid siphons. Widely patent anterior communicating artery. Normal appearance of the anterior and middle cerebral arteries. Supernumerary anterior cerebral artery arising from LEFT A1-2 junction. Posterior circulation: Normal appearance of the vertebral arteries, vertebrobasilar junction and basilar artery, as well as main branch vessels. Bilateral posterior communicating arteries contribute to posterior circulation. Normal appearance of the posterior cerebral arteries. No large vessel occlusion, hemodynamically significant stenosis, dissection, luminal irregularity, contrast extravasation or aneurysm within the anterior nor posterior circulation. No abnormal intracranial enhancement on delayed phase. IMPRESSION: CT HEAD: No abnormal intracranial enhancement. CTA NECK: No acute vascular process or hemodynamically significant stenosis. CTA HEAD: No acute large vessel occlusion or high-grade stenosis. Complete circle of Willis. Electronically Signed   By: Elon Alas M.D.   On: 10/17/2015 21:46   Ct Head Wo Contrast  10/17/2015  ADDENDUM REPORT: 10/17/2015 16:40 ADDENDUM: Study discussed by telephone with Dr. Wallie Char on 10/17/2015 at 1624 hours. Electronically Signed   By: Genevie Ann M.D.   On: 10/17/2015 16:40  10/17/2015  CLINICAL DATA:  65 year old female code stroke. Right side weakness and abnormal speech. Last seen normal 1510 hours. Initial encounter. EXAM: CT HEAD WITHOUT CONTRAST TECHNIQUE: Contiguous axial images were obtained from the base of the skull through the vertex without intravenous contrast. COMPARISON:  None. FINDINGS: Visualized paranasal sinuses and  mastoids are clear. No acute osseous abnormality identified. Visualized orbits and scalp soft tissues are within normal limits. Calcified atherosclerosis at the skull base.  Dural calcifications. Asymmetric hyperdensity at the left ICA terminus on series 201, image 7. No cortically based acute infarct identified. No acute intracranial hemorrhage identified. No midline shift, mass effect, or evidence of intracranial mass lesion. Gray-white matter differentiation is within normal limits throughout the brain. IMPRESSION: Suspicious asymmetric hyperdensity at the left ICA terminus, but otherwise normal for age noncontrast CT appearance of the brain. Electronically Signed: By: Genevie Ann M.D. On: 10/17/2015 16:20   Ct Angio Neck W/cm &/or Wo/cm  10/17/2015  CLINICAL DATA:  Followup stroke symptoms.  History of hypertension. EXAM: CT ANGIOGRAPHY HEAD AND NECK TECHNIQUE: Multidetector CT imaging of the head  and neck was performed using the standard protocol during bolus administration of intravenous contrast. Multiplanar CT image reconstructions and MIPs were obtained to evaluate the vascular anatomy. Carotid stenosis measurements (when applicable) are obtained utilizing NASCET criteria, using the distal internal carotid diameter as the denominator. CONTRAST:  165mL OMNIPAQUE IOHEXOL 350 MG/ML SOLN COMPARISON:  MRI of the brain October 17, 2015 at 1838 hours. FINDINGS: CTA NECK Aortic arch: Normal appearance of the thoracic arch, normal branch pattern. The origins of the innominate, left Common carotid artery and subclavian artery are widely patent. Right carotid system: Common carotid artery is widely patent, coursing in a straight line fashion. Normal appearance of the carotid bifurcation without hemodynamically significant stenosis by NASCET criteria. Minimal eccentric calcific atherosclerosis and intimal thickening. Normal appearance of the included internal carotid artery. Left carotid system: Common carotid artery  is widely patent, coursing in a straight line fashion. Normal appearance of the carotid bifurcation without hemodynamically significant stenosis by NASCET criteria. Minimal eccentric calcific atherosclerosis and intimal thickening. Normal appearance of the included internal carotid artery. Vertebral arteries:Left vertebral artery is dominant. Normal appearance of the vertebral arteries, which appear widely patent. Skeleton: No acute osseous process though bone windows have not been submitted. Patient is edentulous. 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 No destructive bony lesions. Other neck: Soft tissues of the neck are nonacute though, not tailored for evaluation. Included heart appears mildly enlarged. Prominent main pulmonary artery, incompletely imaged. Calcified hilar lymph nodes. Atrophic versus resected LEFT submandibular gland. Centrally edematous RIGHT submandibular gland without sialolith or inflammatory changes. CTA HEAD Anterior circulation: Patent cervical internal carotid arteries, petrous, cavernous and supra clinoid internal carotid arteries. Moderate calcific atherosclerosis of the carotid siphons. Widely patent anterior communicating artery. Normal appearance of the anterior and middle cerebral arteries. Supernumerary anterior cerebral artery arising from LEFT A1-2 junction. Posterior circulation: Normal appearance of the vertebral arteries, vertebrobasilar junction and basilar artery, as well as main branch vessels. Bilateral posterior communicating arteries contribute to posterior circulation. Normal appearance of the posterior cerebral arteries. No large vessel occlusion, hemodynamically significant stenosis, dissection, luminal irregularity, contrast extravasation or aneurysm within the anterior nor posterior circulation. No abnormal intracranial enhancement on delayed phase. IMPRESSION: CT HEAD: No abnormal intracranial enhancement. CTA NECK: No acute vascular process or  hemodynamically significant stenosis. CTA HEAD: No acute large vessel occlusion or high-grade stenosis. Complete circle of Willis. Electronically Signed   By: Elon Alas M.D.   On: 10/17/2015 21:46   Mr Brain Wo Contrast  10/17/2015  CLINICAL DATA:  Code stroke status with new onset of right-sided weakness. EXAM: MRI HEAD WITHOUT CONTRAST TECHNIQUE: Multiplanar, multiecho pulse sequences of the brain and surrounding structures were obtained without intravenous contrast. COMPARISON:  Head CT same day FINDINGS: No abnormality is seen affecting the brainstem. No acute infarction affecting the cerebellum. I think there are a few old small vessel infarctions within the cerebellum. Cerebral hemispheres show background pattern of a few small foci of T2 and FLAIR signal in the white matter suggesting mild small vessel disease. There are scattered punctate acute infarctions in both hemispheres, much more extensive on the left than the right, largely in a watershed distribution. These could represent a shower of micro embolic emboli or could actually relate to watershed infarction. No large confluent infarction. No mass effect or gross hemorrhage. No hydrocephalus. No extra-axial collection. No pituitary mass. No inflammatory sinus disease. No skull or skullbase lesion. Major vessels at the base of the brain show flow. IMPRESSION: Innumerable punctate infarctions  affecting both cerebral hemispheres, much more extensive on the left than the right, which could be due to a shower of micro emboli or watershed infarctions. No large confluent infarction. No mass effect or hemorrhage. Electronically Signed   By: Nelson Chimes M.D.   On: 10/17/2015 19:09    Oren Binet, MD  [facility] Pager:[phone]  If 7PM-7AM, please contact night-coverage [email or web address] Password [email or web address] 10/22/2015, 2:30 PM   LOS: 5 days

## 2015-10-22 NOTE — Progress Notes (Signed)
Occupational Therapy Treatment Patient Details Name: Julia Schwartz MRN: [id number] DOB: 04-02-1951 Today's Date: 10/22/2015    History of present illness Pt is a 65 y/o F brought to ED w/ code stroke.  Imaging revealed Bil Lt>Rt punctate infarcts.  [name] PMH includes fibromyalgia, depression, anxiety, chronic back pain.   OT comments  Pt is making good gains and participates well with OT.  Recommend CIR to increase safety and independence with adls.  Pt with good carry over of using RUE.  Needs to continue to work on scanning environment  Follow Up Recommendations  CIR    Equipment Recommendations  3 in 1 bedside comode    Recommendations for Other Services      Precautions / Restrictions Precautions Precautions: Fall Restrictions Weight Bearing Restrictions: No       Mobility Bed Mobility         Supine to sit: Min guard Sit to supine: Min guard   General bed mobility comments: for safety  Transfers     Transfers: Sit to/from Stand Sit to Stand: Min guard         General transfer comment: for safety; pt tends to move quickly    Balance                                   ADL       Grooming: Min guard;Oral care;Standing   Upper Body Bathing: Min guard;Standing   Lower Body Bathing: Min guard;Sit to/from stand   Upper Body Dressing : Minimal assistance;Sitting   Lower Body Dressing: Minimal assistance;Sit to/from stand   Toilet Transfer: Min guard;Ambulation;BSC   Toileting- Clothing Manipulation and Hygiene: Supervision/safety;Sitting/lateral lean         General ADL Comments: Performed ADL from standing at sink except sitting in bed to wash feet and don socks.  Encouraged incorporation of RUE.  Pt did this once cued. Recommended she use LUE for self feeding, brushing teeth so she doesn't spill things and doesn't cause a bruise inside of mouth.  She did well using for bathing; min A to apply deodorant under LUE; more difficult to  grip, but she did pinch with donning one sock.  Pt dropped a washcloth and knocked the emesis basin over--has decreased control.  Able to move all UE muscles reciprocally.  Still needs to work on scanning environment to [employer]                 Additional Comments: encouraged scanning to R side; no ptosis noted this session   Perception     Praxis      Cognition   Behavior During Therapy: WFL for tasks assessed/performed Overall Cognitive Status: Impaired/Different from baseline            Safety/Judgement: Decreased awareness of deficits          Extremity/Trunk Assessment               Exercises     Shoulder Instructions       General Comments      Pertinent Vitals/ Pain       Pain Assessment: Faces Faces Pain Scale: Hurts even more Pain Location: R triceps Pain Descriptors / Indicators: Burning Pain Intervention(s): Limited activity within patient's tolerance;Monitored during session;Repositioned  Home Living  Prior Functioning/Environment              Frequency Min 3X/week     Progress Toward Goals  OT Goals(current goals can now be found in the care plan section)  Progress towards OT goals: Progressing toward goals     Plan      Co-evaluation                 End of Session     Activity Tolerance Patient tolerated treatment well   Patient Left in bed;with call bell/phone within reach;with bed alarm set;with family/visitor present   Nurse Communication          Time: [id number] OT Time Calculation (min): 48 min  Charges: OT General Charges $OT Visit: 1 Procedure OT Treatments $Self Care/Home Management : 23-37 mins $Therapeutic Activity: 8-22 mins  Aramis Weil 10/22/2015, 10:57 AM  Lesle Chris, OTR/L [phone] 10/22/2015

## 2015-10-22 NOTE — Progress Notes (Signed)
Patient Name: Julia Schwartz Date of Encounter: 10/22/2015   SUBJECTIVE  Feeling well. No chest pain, sob or palpitations.   CURRENT MEDS . atorvastatin  20 mg Oral q1800  . busPIRone  15 mg Oral BID  . ceFAZolin      . DULoxetine  60 mg Oral Daily  . pantoprazole  80 mg Oral Daily  . pregabalin  150 mg Oral Daily  . pregabalin  75 mg Oral QHS  . zolpidem  10 mg Oral QHS    OBJECTIVE  Filed Vitals:   10/21/15 2000 10/21/15 2127 10/22/15 0153 10/22/15 0538  BP:  124/85 118/79 127/77  Pulse:  85 77 79  Temp:  98.3 F (36.8 C) 98.5 F (36.9 C) 98.6 F (37 C)  TempSrc:  Oral Oral Oral  Resp: 19 18 18 18   Height:      Weight:      SpO2:  100% 98% 99%   No intake or output data in the 24 hours ending 10/22/15 0923 Filed Weights   10/17/15 2153  Weight: 163 lb 11.2 oz (74.254 kg)    PHYSICAL EXAM  General: Pleasant, NAD. Neuro: Alert and oriented X 3. Moves all extremities spontaneously. R sided weakness.  Psych: Normal affect. HEENT:  Normal  Neck: Supple without bruits or JVD. Lungs:  Resp regular and unlabored, CTA. Heart: RRR no s3, s4, or murmurs. Abdomen: Soft, non-tender, non-distended, BS + x 4.  Extremities: No clubbing, cyanosis or edema. DP/PT/Radials 2+ and equal bilaterally.  Accessory Clinical Findings  CBC  Recent Labs  10/21/15 0345 10/22/15 0545  WBC 4.7 4.7  HGB 11.0* 11.2*  HCT 33.8* 34.3*  MCV 88.0 88.2  PLT 240 232   TELE  Sinus rhythm  Radiology/Studies  Ct Angio Head W/cm &/or Wo Cm  10/17/2015  CLINICAL DATA:  Followup stroke symptoms.  History of hypertension. EXAM: CT ANGIOGRAPHY HEAD AND NECK TECHNIQUE: Multidetector CT imaging of the head and neck was performed using the standard protocol during bolus administration of intravenous contrast. Multiplanar CT image reconstructions and MIPs were obtained to evaluate the vascular anatomy. Carotid stenosis measurements (when applicable) are obtained utilizing NASCET  criteria, using the distal internal carotid diameter as the denominator. CONTRAST:  180mL OMNIPAQUE IOHEXOL 350 MG/ML SOLN COMPARISON:  MRI of the brain October 17, 2015 at 1838 hours. FINDINGS: CTA NECK Aortic arch: Normal appearance of the thoracic arch, normal branch pattern. The origins of the innominate, left Common carotid artery and subclavian artery are widely patent. Right carotid system: Common carotid artery is widely patent, coursing in a straight line fashion. Normal appearance of the carotid bifurcation without hemodynamically significant stenosis by NASCET criteria. Minimal eccentric calcific atherosclerosis and intimal thickening. Normal appearance of the included internal carotid artery. Left carotid system: Common carotid artery is widely patent, coursing in a straight line fashion. Normal appearance of the carotid bifurcation without hemodynamically significant stenosis by NASCET criteria. Minimal eccentric calcific atherosclerosis and intimal thickening. Normal appearance of the included internal carotid artery. Vertebral arteries:Left vertebral artery is dominant. Normal appearance of the vertebral arteries, which appear widely patent. Skeleton: No acute osseous process though bone windows have not been submitted. Patient is edentulous. 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 No destructive bony lesions. Other neck: Soft tissues of the neck are nonacute though, not tailored for evaluation. Included heart appears mildly enlarged. Prominent main pulmonary artery, incompletely imaged. Calcified hilar lymph nodes. Atrophic versus resected LEFT submandibular gland. Centrally edematous RIGHT submandibular gland without sialolith or  inflammatory changes. CTA HEAD Anterior circulation: Patent cervical internal carotid arteries, petrous, cavernous and supra clinoid internal carotid arteries. Moderate calcific atherosclerosis of the carotid siphons. Widely patent anterior communicating artery.  Normal appearance of the anterior and middle cerebral arteries. Supernumerary anterior cerebral artery arising from LEFT A1-2 junction. Posterior circulation: Normal appearance of the vertebral arteries, vertebrobasilar junction and basilar artery, as well as main branch vessels. Bilateral posterior communicating arteries contribute to posterior circulation. Normal appearance of the posterior cerebral arteries. No large vessel occlusion, hemodynamically significant stenosis, dissection, luminal irregularity, contrast extravasation or aneurysm within the anterior nor posterior circulation. No abnormal intracranial enhancement on delayed phase. IMPRESSION: CT HEAD: No abnormal intracranial enhancement. CTA NECK: No acute vascular process or hemodynamically significant stenosis. CTA HEAD: No acute large vessel occlusion or high-grade stenosis. Complete circle of Willis. Electronically Signed   By: Elon Alas M.D.   On: 10/17/2015 21:46   Ct Head Wo Contrast  10/17/2015  ADDENDUM REPORT: 10/17/2015 16:40 ADDENDUM: Study discussed by telephone with Dr. Wallie Char on 10/17/2015 at 1624 hours. Electronically Signed   By: Genevie Ann M.D.   On: 10/17/2015 16:40  10/17/2015  CLINICAL DATA:  65 year old female code stroke. Right side weakness and abnormal speech. Last seen normal 1510 hours. Initial encounter. EXAM: CT HEAD WITHOUT CONTRAST TECHNIQUE: Contiguous axial images were obtained from the base of the skull through the vertex without intravenous contrast. COMPARISON:  None. FINDINGS: Visualized paranasal sinuses and mastoids are clear. No acute osseous abnormality identified. Visualized orbits and scalp soft tissues are within normal limits. Calcified atherosclerosis at the skull base.  Dural calcifications. Asymmetric hyperdensity at the left ICA terminus on series 201, image 7. No cortically based acute infarct identified. No acute intracranial hemorrhage identified. No midline shift, mass effect, or  evidence of intracranial mass lesion. Gray-white matter differentiation is within normal limits throughout the brain. IMPRESSION: Suspicious asymmetric hyperdensity at the left ICA terminus, but otherwise normal for age noncontrast CT appearance of the brain. Electronically Signed: By: Genevie Ann M.D. On: 10/17/2015 16:20   Ct Angio Neck W/cm &/or Wo/cm  10/17/2015  CLINICAL DATA:  Followup stroke symptoms.  History of hypertension. EXAM: CT ANGIOGRAPHY HEAD AND NECK TECHNIQUE: Multidetector CT imaging of the head and neck was performed using the standard protocol during bolus administration of intravenous contrast. Multiplanar CT image reconstructions and MIPs were obtained to evaluate the vascular anatomy. Carotid stenosis measurements (when applicable) are obtained utilizing NASCET criteria, using the distal internal carotid diameter as the denominator. CONTRAST:  138mL OMNIPAQUE IOHEXOL 350 MG/ML SOLN COMPARISON:  MRI of the brain October 17, 2015 at 1838 hours. FINDINGS: CTA NECK Aortic arch: Normal appearance of the thoracic arch, normal branch pattern. The origins of the innominate, left Common carotid artery and subclavian artery are widely patent. Right carotid system: Common carotid artery is widely patent, coursing in a straight line fashion. Normal appearance of the carotid bifurcation without hemodynamically significant stenosis by NASCET criteria. Minimal eccentric calcific atherosclerosis and intimal thickening. Normal appearance of the included internal carotid artery. Left carotid system: Common carotid artery is widely patent, coursing in a straight line fashion. Normal appearance of the carotid bifurcation without hemodynamically significant stenosis by NASCET criteria. Minimal eccentric calcific atherosclerosis and intimal thickening. Normal appearance of the included internal carotid artery. Vertebral arteries:Left vertebral artery is dominant. Normal appearance of the vertebral arteries,  which appear widely patent. Skeleton: No acute osseous process though bone windows have not been submitted. Patient is edentulous.  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 No destructive bony lesions. Other neck: Soft tissues of the neck are nonacute though, not tailored for evaluation. Included heart appears mildly enlarged. Prominent main pulmonary artery, incompletely imaged. Calcified hilar lymph nodes. Atrophic versus resected LEFT submandibular gland. Centrally edematous RIGHT submandibular gland without sialolith or inflammatory changes. CTA HEAD Anterior circulation: Patent cervical internal carotid arteries, petrous, cavernous and supra clinoid internal carotid arteries. Moderate calcific atherosclerosis of the carotid siphons. Widely patent anterior communicating artery. Normal appearance of the anterior and middle cerebral arteries. Supernumerary anterior cerebral artery arising from LEFT A1-2 junction. Posterior circulation: Normal appearance of the vertebral arteries, vertebrobasilar junction and basilar artery, as well as main branch vessels. Bilateral posterior communicating arteries contribute to posterior circulation. Normal appearance of the posterior cerebral arteries. No large vessel occlusion, hemodynamically significant stenosis, dissection, luminal irregularity, contrast extravasation or aneurysm within the anterior nor posterior circulation. No abnormal intracranial enhancement on delayed phase. IMPRESSION: CT HEAD: No abnormal intracranial enhancement. CTA NECK: No acute vascular process or hemodynamically significant stenosis. CTA HEAD: No acute large vessel occlusion or high-grade stenosis. Complete circle of Willis. Electronically Signed   By: Elon Alas M.D.   On: 10/17/2015 21:46   Mr Brain Wo Contrast  10/17/2015  CLINICAL DATA:  Code stroke status with new onset of right-sided weakness. EXAM: MRI HEAD WITHOUT CONTRAST TECHNIQUE: Multiplanar, multiecho pulse  sequences of the brain and surrounding structures were obtained without intravenous contrast. COMPARISON:  Head CT same day FINDINGS: No abnormality is seen affecting the brainstem. No acute infarction affecting the cerebellum. I think there are a few old small vessel infarctions within the cerebellum. Cerebral hemispheres show background pattern of a few small foci of T2 and FLAIR signal in the white matter suggesting mild small vessel disease. There are scattered punctate acute infarctions in both hemispheres, much more extensive on the left than the right, largely in a watershed distribution. These could represent a shower of micro embolic emboli or could actually relate to watershed infarction. No large confluent infarction. No mass effect or gross hemorrhage. No hydrocephalus. No extra-axial collection. No pituitary mass. No inflammatory sinus disease. No skull or skullbase lesion. Major vessels at the base of the brain show flow. IMPRESSION: Innumerable punctate infarctions affecting both cerebral hemispheres, much more extensive on the left than the right, which could be due to a shower of micro emboli or watershed infarctions. No large confluent infarction. No mass effect or hemorrhage. Electronically Signed   By: Nelson Chimes M.D.   On: 10/17/2015 19:09    ASSESSMENT AND PLAN  Julia Schwartz is a 65 y.o. female with h/o HTN, anxiety, depression, GERD and current tobacco smoker who had no previous cardiac history admitted 10/17/15 with acute stroke.   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 Seen by [facility] and plan for surgical resection Oct 31, 2015. Recommended LHC prior to surgery. Timing per MD. Continue IV heparin and PT. No chest pain.     Signed, Bhagat,Bhavinkumar PA-C   History and all data above reviewed.  Patient examined.  I agree with the findings as above.  Discussed with the patient.  She has some right shoulder pain.  No chest pain.  No SOB.  The patient  exam reveals COR:RRR  ,  Lungs: Clear  ,  Abd: Positive bowel sounds, no rebound no guarding, Ext No edema  .  All available labs, radiology testing, previous records reviewed. Agree with documented assessment and plan. Atrial myxoma:  Surgery on the 13th.  Plan cardiac cath tomorrow.  The patient understands that risks included but are not limited to stroke (1 in 1000), death (1 in 61), kidney failure [usually temporary] (1 in 500), bleeding (1 in 200), allergic reaction [possibly serious] (1 in 200).  The patient understands and agrees to proceed.   Probably home on Lovenox pending the surgery after the cath.   Tobacco abuse:  Educated patient and family.    Jeneen Rinks [facility]  10:56 AM  10/22/2015

## 2015-10-22 NOTE — Progress Notes (Signed)
Physical Therapy Treatment Patient Details Name: Julia Schwartz MRN: [id number] DOB: May 11, 1951 Today's Date: 10/22/2015    History of Present Illness Pt is a 65 y/o F brought to ED w/ code stroke.  Imaging revealed Bil Lt>Rt punctate infarcts.  [name] PMH includes fibromyalgia, depression, anxiety, chronic back pain.    PT Comments    Patient making excellent progress. No losses of balance or running into objects, however did slow her velocity when concentrating on cognitive tasks while walking. Agree with plan for HHPT on discharge.    Follow Up Recommendations  Home health PT;Supervision for mobility/OOB     Equipment Recommendations  None recommended by PT    Recommendations for Other Services      Precautions / Restrictions Precautions Precautions: Fall Restrictions Weight Bearing Restrictions: No    Mobility  Bed Mobility Overal bed mobility: Modified Independent Bed Mobility: Supine to Sit;Sit to Supine     Supine to sit: Modified independent (Device/Increase time) Sit to supine: Modified independent (Device/Increase time)   General bed mobility comments: incr time, however managed linens out of and into bed  Transfers Overall transfer level: Needs assistance Equipment used: None Transfers: Sit to/from Stand Sit to Stand: Supervision            Ambulation/Gait Ambulation/Gait assistance: Supervision Ambulation Distance (Feet): 200 Feet Assistive device: None Gait Pattern/deviations: Step-through pattern;Decreased stride length   Gait velocity interpretation: Below normal speed for age/gender General Gait Details: no dragging of Rt foot noted; pt asked to carry object in her lt hand while walking and also given cognitive challenge; pt would slow slightly, however no imbalance   Stairs            Wheelchair Mobility    Modified Rankin (Stroke Patients Only) Modified Rankin (Stroke Patients Only) Pre-Morbid Rankin Score: No  symptoms Modified Rankin: Moderately severe disability     Balance     Sitting balance-Leahy Scale: Fair       Standing balance-Leahy Scale: Good Standing balance comment: bending/reaching to manage clothing while toileting                    Cognition Arousal/Alertness: Awake/alert Behavior During Therapy: WFL for tasks assessed/performed Overall Cognitive Status: Impaired/Different from baseline Area of Impairment: Attention;Memory;Awareness   Current Attention Level: Selective Memory: Decreased short-term memory   Safety/Judgement: Decreased awareness of deficits Awareness: Emergent Problem Solving: Difficulty sequencing;Requires verbal cues;Requires tactile cues General Comments: asked her to tell how she makes spaghetti while ambulating; pt with max difficulty stating sequence while focusing also on walking     Exercises      General Comments        Pertinent Vitals/Pain Pain Assessment: No/denies pain    Home Living                      Prior Function            PT Goals (current goals can now be found in the care plan section) Acute Rehab PT Goals Patient Stated Goal: none stated Time For Goal Achievement: 11/03/15 Progress towards PT goals: PT to reassess next treatment (current goals met; may d/c after cath 1/4)    Frequency  Min 3X/week    PT Plan Discharge plan needs to be updated;Frequency needs to be updated    Co-evaluation             End of Session   Activity Tolerance: Patient tolerated treatment well Patient left: with call  bell/phone within reach;with family/visitor present;in bed;with bed alarm set     Time: 1436-1456 PT Time Calculation (min) (ACUTE ONLY): 20 min  Charges:  $Gait Training: 8-22 mins                    G Codes:      Coulton Schlink 2015-10-28, 3:14 PM Pager [phone]

## 2015-10-22 NOTE — Progress Notes (Signed)
ANTICOAGULATION CONSULT NOTE - Follow-up Consult  Pharmacy Consult for Heparin Indication: stroke  Patient Measurements: Height: 5\' 1"  (154.9 cm) Weight: 163 lb 11.2 oz (74.254 kg) IBW/kg (Calculated) : 47.8 Heparin Dosing Weight: 64.1 kg  Vital Signs: Temp: 99.5 F (37.5 C) (01/03 0957) Temp Source: Oral (01/03 0957) BP: 129/77 mmHg (01/03 0957) Pulse Rate: 96 (01/03 0957)  Labs:  Recent Labs  10/20/15 0305 10/20/15 0833 10/21/15 0345 10/22/15 0545  HGB 10.9*  --  11.0* 11.2*  HCT 32.7*  --  33.8* 34.3*  PLT 215  --  240 232  HEPARINUNFRC 0.49 0.51 0.47 0.47     Assessment: 65 yo F presented to ED 12/29 with new R sided weakness. MRI showed bilateral innumerable punctate acute infarcts consistent with embolic showering. 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  Heparin level therapeutic today  Goal of Therapy:  Heparin level 0.3-0.5 units/ml Monitor platelets by anticoagulation protocol: Yes   Plan:  1. Continue heparin infusion at 800 units/hr as current rate has been maintaining fairly steady levels with last few checks 2. HL in am and daily  Thank you Anette Guarneri, PharmD [phone] 10/22/2015, 10:03 AM

## 2015-10-23 ENCOUNTER — Encounter (HOSPITAL_COMMUNITY)
Admission: EM | Disposition: A | Discharge status: Home Health | Payer: Self-pay | Source: Home / Self Care | Attending: Internal Medicine

## 2015-10-23 ENCOUNTER — Encounter (HOSPITAL_COMMUNITY): Payer: Self-pay | Admitting: Interventional Cardiology

## 2015-10-23 DIAGNOSIS — I639 Cerebral infarction, unspecified: Secondary | ICD-10-CM | POA: Insufficient documentation

## 2015-10-23 HISTORY — PX: CARDIAC CATHETERIZATION: SHX172

## 2015-10-23 LAB — PROTHROMBIN GENE MUTATION

## 2015-10-23 LAB — CBC
HEMATOCRIT: 33.2 % — AB (ref 36.0–46.0)
HEMOGLOBIN: 10.7 g/dL — AB (ref 12.0–15.0)
MCH: 28.5 pg (ref 26.0–34.0)
MCHC: 32.2 g/dL (ref 30.0–36.0)
MCV: 88.3 fL (ref 78.0–100.0)
Platelets: 230 10*3/uL (ref 150–400)
RBC: 3.76 MIL/uL — ABNORMAL LOW (ref 3.87–5.11)
RDW: 13.3 % (ref 11.5–15.5)
WBC: 4.5 10*3/uL (ref 4.0–10.5)

## 2015-10-23 LAB — CULTURE, BLOOD (ROUTINE X 2)
Culture: NO GROWTH
Culture: NO GROWTH

## 2015-10-23 LAB — HEPARIN LEVEL (UNFRACTIONATED): Heparin Unfractionated: 0.57 IU/mL (ref 0.30–0.70)

## 2015-10-23 SURGERY — LEFT HEART CATH AND CORONARY ANGIOGRAPHY

## 2015-10-23 MED ORDER — SODIUM CHLORIDE 0.9 % IV SOLN: 250.0000 mL | INTRAVENOUS | Status: DC | PRN

## 2015-10-23 MED ORDER — HEPARIN (PORCINE) IN NACL 2-0.9 UNIT/ML-% IJ SOLN
INTRAMUSCULAR | Status: AC
  Filled 2015-10-23: qty 1000

## 2015-10-23 MED ORDER — ENOXAPARIN SODIUM 100 MG/ML ~~LOC~~ SOLN
100.0000 mg | SUBCUTANEOUS | Status: DC
  Administered 2015-10-23: 100 mg via SUBCUTANEOUS
  Filled 2015-10-23: qty 1

## 2015-10-23 MED ORDER — HEPARIN SODIUM (PORCINE) 1000 UNIT/ML IJ SOLN
INTRAMUSCULAR | Status: AC
  Filled 2015-10-23: qty 1

## 2015-10-23 MED ORDER — HEPARIN SODIUM (PORCINE) 1000 UNIT/ML IJ SOLN
INTRAMUSCULAR | Status: DC | PRN
Start: 2015-10-23 — End: 2015-10-23
  Administered 2015-10-23: 3500 [IU] via INTRAVENOUS

## 2015-10-23 MED ORDER — SODIUM CHLORIDE 0.9 % WEIGHT BASED INFUSION: 1.0000 mL/kg/h | INTRAVENOUS | Status: DC

## 2015-10-23 MED ORDER — SODIUM CHLORIDE 0.9 % WEIGHT BASED INFUSION
3.0000 mL/kg/h | INTRAVENOUS | Status: DC
  Administered 2015-10-23: 3 mL/kg/h via INTRAVENOUS

## 2015-10-23 MED ORDER — SODIUM CHLORIDE 0.9 % WEIGHT BASED INFUSION: 3.0000 mL/kg/h | INTRAVENOUS | Status: DC

## 2015-10-23 MED ORDER — SODIUM CHLORIDE 0.9 % IJ SOLN: 3.0000 mL | INTRAMUSCULAR | Status: DC | PRN

## 2015-10-23 MED ORDER — ONDANSETRON HCL 4 MG/2ML IJ SOLN: 4.0000 mg | Freq: Four times a day (QID) | INTRAMUSCULAR | Status: DC | PRN

## 2015-10-23 MED ORDER — ACETAMINOPHEN 325 MG PO TABS: 650.0000 mg | ORAL_TABLET | ORAL | Status: DC | PRN

## 2015-10-23 MED ORDER — FENTANYL CITRATE (PF) 100 MCG/2ML IJ SOLN
INTRAMUSCULAR | Status: DC | PRN
  Administered 2015-10-23: 50 ug via INTRAVENOUS

## 2015-10-23 MED ORDER — ASPIRIN 81 MG PO CHEW: 81.0000 mg | CHEWABLE_TABLET | ORAL | Status: DC

## 2015-10-23 MED ORDER — VERAPAMIL HCL 2.5 MG/ML IV SOLN
INTRAVENOUS | Status: DC | PRN
  Administered 2015-10-23: 10 mL via INTRA_ARTERIAL

## 2015-10-23 MED ORDER — IOHEXOL 350 MG/ML SOLN
INTRAVENOUS | Status: DC | PRN
Start: 2015-10-23 — End: 2015-10-23
  Administered 2015-10-23: 75 mL via INTRAVENOUS

## 2015-10-23 MED ORDER — LIDOCAINE HCL (PF) 1 % IJ SOLN
INTRAMUSCULAR | Status: AC
  Filled 2015-10-23: qty 30

## 2015-10-23 MED ORDER — LIDOCAINE HCL (PF) 1 % IJ SOLN
INTRAMUSCULAR | Status: DC | PRN
  Administered 2015-10-23: 10:00:00

## 2015-10-23 MED ORDER — ASPIRIN 81 MG PO CHEW
81.0000 mg | CHEWABLE_TABLET | ORAL | Status: AC
  Administered 2015-10-23: 81 mg via ORAL
  Filled 2015-10-23: qty 1

## 2015-10-23 MED ORDER — MIDAZOLAM HCL 2 MG/2ML IJ SOLN
INTRAMUSCULAR | Status: AC
  Filled 2015-10-23: qty 2

## 2015-10-23 MED ORDER — HEPARIN (PORCINE) IN NACL 100-0.45 UNIT/ML-% IJ SOLN: 750.0000 [IU]/h | INTRAMUSCULAR | Status: DC

## 2015-10-23 MED ORDER — VERAPAMIL HCL 2.5 MG/ML IV SOLN
INTRAVENOUS | Status: AC
  Filled 2015-10-23: qty 2

## 2015-10-23 MED ORDER — AMLODIPINE BESYLATE 5 MG PO TABS
5.0000 mg | ORAL_TABLET | Freq: Every day | ORAL | Status: DC
  Administered 2015-10-23 – 2015-10-24 (×2): 5 mg via ORAL
  Filled 2015-10-23 (×2): qty 1

## 2015-10-23 MED ORDER — FENTANYL CITRATE (PF) 100 MCG/2ML IJ SOLN
INTRAMUSCULAR | Status: AC
  Filled 2015-10-23: qty 2

## 2015-10-23 MED ORDER — SODIUM CHLORIDE 0.9 % IJ SOLN
3.0000 mL | Freq: Two times a day (BID) | INTRAMUSCULAR | Status: DC
  Administered 2015-10-23 – 2015-10-24 (×2): 3 mL via INTRAVENOUS

## 2015-10-23 MED ORDER — ENOXAPARIN (LOVENOX) PATIENT EDUCATION KIT
PACK | Freq: Once | Status: AC
  Administered 2015-10-23: 16:00:00
  Filled 2015-10-23: qty 1

## 2015-10-23 MED ORDER — MIDAZOLAM HCL 2 MG/2ML IJ SOLN
INTRAMUSCULAR | Status: DC | PRN
  Administered 2015-10-23: 1 mg via INTRAVENOUS

## 2015-10-23 MED ORDER — SODIUM CHLORIDE 0.9 % IJ SOLN: 3.0000 mL | Freq: Two times a day (BID) | INTRAMUSCULAR | Status: DC

## 2015-10-23 SURGICAL SUPPLY — 10 items
CATH INFINITI 5 FR JL3.5 (CATHETERS) ×3 IMPLANT
CATH INFINITI JR4 5F (CATHETERS) ×3 IMPLANT
DEVICE RAD COMP TR BAND LRG (VASCULAR PRODUCTS) ×3 IMPLANT
GLIDESHEATH SLEND A-KIT 6F 22G (SHEATH) ×3 IMPLANT
KIT HEART LEFT (KITS) ×3 IMPLANT
PACK CARDIAC CATHETERIZATION (CUSTOM PROCEDURE TRAY) ×3 IMPLANT
TRANSDUCER W/STOPCOCK (MISCELLANEOUS) ×3 IMPLANT
TUBING CIL FLEX 10 FLL-RA (TUBING) ×3 IMPLANT
WIRE HI TORQ VERSACORE-J 145CM (WIRE) ×2 IMPLANT
WIRE SAFE-T 1.5MM-J .035X260CM (WIRE) ×3 IMPLANT

## 2015-10-23 NOTE — Progress Notes (Signed)
ANTICOAGULATION CONSULT NOTE - Follow-up Consult  Pharmacy Consult for Heparin-->Lovenox Indication: stroke  Patient Measurements: Height: 5\' 1"  (154.9 cm) Weight: 161 lb 9.6 oz (73.301 kg) IBW/kg (Calculated) : 47.8 Heparin Dosing Weight: 64.1 kg  Vital Signs: Temp: 99.5 F (37.5 C) (01/04 1428) Temp Source: Oral (01/04 1428) BP: 124/77 mmHg (01/04 1428) Pulse Rate: 70 (01/04 1428)  Labs:  Recent Labs  10/21/15 0345 10/22/15 0545 10/23/15 0747  HGB 11.0* 11.2* 10.7*  HCT 33.8* 34.3* 33.2*  PLT 240 232 230  HEPARINUNFRC 0.47 0.47 0.57     Assessment: 65 yo F presented to ED 12/29 with new R sided weakness. MRI showed bilateral innumerable punctate acute infarcts consistent with embolic showering. 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  S/p cath today, heparin level slightly high this AM  Goal of Therapy:  Heparin level 0.3-0.5 units/ml Monitor platelets by anticoagulation protocol: Yes   Plan:  Lovenox 100 mg sq Q 24 hours to begin at 8 pm tonight Follow up in AM   Thank you Anette Guarneri, PharmD [phone] 10/23/2015, 3:33 PM

## 2015-10-23 NOTE — Care Management Note (Signed)
Case Management Note  Patient Details  Name: Julia Schwartz MRN: [id number] Date of Birth: Jul 23, 1951  Subjective/Objective:                    Action/Plan: Plan is for patient to discharge tomorrow with home health PT. CM met with the patient and provided her a list of home health agencies in the [facility] area. She wanted to look over list and give me her decision tomorrow. CM will follow up with [name] on 1/5.    Expected Discharge Date:                  Expected Discharge Plan:  [place]  In-House Referral:     Discharge planning Services  CM Consult  Post Acute Care Choice:    Choice offered to:     DME Arranged:    DME Agency:     HH Arranged:    [place] Agency:     Status of Service:  In process, will continue to follow  Medicare Important Message Given:  Yes Date Medicare IM Given:    Medicare IM give by:    Date Additional Medicare IM Given:    Additional Medicare Important Message give by:     If discussed at [place] of Stay Meetings, dates discussed:    Additional Comments:  Pollie Friar, RN 10/23/2015, 4:08 PM

## 2015-10-23 NOTE — H&P (View-Only) (Signed)
Patient Name: Julia Schwartz Date of Encounter: 10/22/2015   SUBJECTIVE  Feeling well. No chest pain, sob or palpitations.   CURRENT MEDS . atorvastatin  20 mg Oral q1800  . busPIRone  15 mg Oral BID  . ceFAZolin      . DULoxetine  60 mg Oral Daily  . pantoprazole  80 mg Oral Daily  . pregabalin  150 mg Oral Daily  . pregabalin  75 mg Oral QHS  . zolpidem  10 mg Oral QHS    OBJECTIVE  Filed Vitals:   10/21/15 2000 10/21/15 2127 10/22/15 0153 10/22/15 0538  BP:  124/85 118/79 127/77  Pulse:  85 77 79  Temp:  98.3 F (36.8 C) 98.5 F (36.9 C) 98.6 F (37 C)  TempSrc:  Oral Oral Oral  Resp: 19 18 18 18   Height:      Weight:      SpO2:  100% 98% 99%   No intake or output data in the 24 hours ending 10/22/15 0923 Filed Weights   10/17/15 2153  Weight: 163 lb 11.2 oz (74.254 kg)    PHYSICAL EXAM  General: Pleasant, NAD. Neuro: Alert and oriented X 3. Moves all extremities spontaneously. R sided weakness.  Psych: Normal affect. HEENT:  Normal  Neck: Supple without bruits or JVD. Lungs:  Resp regular and unlabored, CTA. Heart: RRR no s3, s4, or murmurs. Abdomen: Soft, non-tender, non-distended, BS + x 4.  Extremities: No clubbing, cyanosis or edema. DP/PT/Radials 2+ and equal bilaterally.  Accessory Clinical Findings  CBC  Recent Labs  10/21/15 0345 10/22/15 0545  WBC 4.7 4.7  HGB 11.0* 11.2*  HCT 33.8* 34.3*  MCV 88.0 88.2  PLT 240 232   TELE  Sinus rhythm  Radiology/Studies  Ct Angio Head W/cm &/or Wo Cm  10/17/2015  CLINICAL DATA:  Followup stroke symptoms.  History of hypertension. EXAM: CT ANGIOGRAPHY HEAD AND NECK TECHNIQUE: Multidetector CT imaging of the head and neck was performed using the standard protocol during bolus administration of intravenous contrast. Multiplanar CT image reconstructions and MIPs were obtained to evaluate the vascular anatomy. Carotid stenosis measurements (when applicable) are obtained utilizing NASCET  criteria, using the distal internal carotid diameter as the denominator. CONTRAST:  156mL OMNIPAQUE IOHEXOL 350 MG/ML SOLN COMPARISON:  MRI of the brain October 17, 2015 at 1838 hours. FINDINGS: CTA NECK Aortic arch: Normal appearance of the thoracic arch, normal branch pattern. The origins of the innominate, left Common carotid artery and subclavian artery are widely patent. Right carotid system: Common carotid artery is widely patent, coursing in a straight line fashion. Normal appearance of the carotid bifurcation without hemodynamically significant stenosis by NASCET criteria. Minimal eccentric calcific atherosclerosis and intimal thickening. Normal appearance of the included internal carotid artery. Left carotid system: Common carotid artery is widely patent, coursing in a straight line fashion. Normal appearance of the carotid bifurcation without hemodynamically significant stenosis by NASCET criteria. Minimal eccentric calcific atherosclerosis and intimal thickening. Normal appearance of the included internal carotid artery. Vertebral arteries:Left vertebral artery is dominant. Normal appearance of the vertebral arteries, which appear widely patent. Skeleton: No acute osseous process though bone windows have not been submitted. Patient is edentulous. 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 No destructive bony lesions. Other neck: Soft tissues of the neck are nonacute though, not tailored for evaluation. Included heart appears mildly enlarged. Prominent main pulmonary artery, incompletely imaged. Calcified hilar lymph nodes. Atrophic versus resected LEFT submandibular gland. Centrally edematous RIGHT submandibular gland without sialolith or  inflammatory changes. CTA HEAD Anterior circulation: Patent cervical internal carotid arteries, petrous, cavernous and supra clinoid internal carotid arteries. Moderate calcific atherosclerosis of the carotid siphons. Widely patent anterior communicating artery.  Normal appearance of the anterior and middle cerebral arteries. Supernumerary anterior cerebral artery arising from LEFT A1-2 junction. Posterior circulation: Normal appearance of the vertebral arteries, vertebrobasilar junction and basilar artery, as well as main branch vessels. Bilateral posterior communicating arteries contribute to posterior circulation. Normal appearance of the posterior cerebral arteries. No large vessel occlusion, hemodynamically significant stenosis, dissection, luminal irregularity, contrast extravasation or aneurysm within the anterior nor posterior circulation. No abnormal intracranial enhancement on delayed phase. IMPRESSION: CT HEAD: No abnormal intracranial enhancement. CTA NECK: No acute vascular process or hemodynamically significant stenosis. CTA HEAD: No acute large vessel occlusion or high-grade stenosis. Complete circle of Willis. Electronically Signed   By: Elon Alas M.D.   On: 10/17/2015 21:46   Ct Head Wo Contrast  10/17/2015  ADDENDUM REPORT: 10/17/2015 16:40 ADDENDUM: Study discussed by telephone with Dr. Wallie Char on 10/17/2015 at 1624 hours. Electronically Signed   By: Genevie Ann M.D.   On: 10/17/2015 16:40  10/17/2015  CLINICAL DATA:  65 year old female code stroke. Right side weakness and abnormal speech. Last seen normal 1510 hours. Initial encounter. EXAM: CT HEAD WITHOUT CONTRAST TECHNIQUE: Contiguous axial images were obtained from the base of the skull through the vertex without intravenous contrast. COMPARISON:  None. FINDINGS: Visualized paranasal sinuses and mastoids are clear. No acute osseous abnormality identified. Visualized orbits and scalp soft tissues are within normal limits. Calcified atherosclerosis at the skull base.  Dural calcifications. Asymmetric hyperdensity at the left ICA terminus on series 201, image 7. No cortically based acute infarct identified. No acute intracranial hemorrhage identified. No midline shift, mass effect, or  evidence of intracranial mass lesion. Gray-white matter differentiation is within normal limits throughout the brain. IMPRESSION: Suspicious asymmetric hyperdensity at the left ICA terminus, but otherwise normal for age noncontrast CT appearance of the brain. Electronically Signed: By: Genevie Ann M.D. On: 10/17/2015 16:20   Ct Angio Neck W/cm &/or Wo/cm  10/17/2015  CLINICAL DATA:  Followup stroke symptoms.  History of hypertension. EXAM: CT ANGIOGRAPHY HEAD AND NECK TECHNIQUE: Multidetector CT imaging of the head and neck was performed using the standard protocol during bolus administration of intravenous contrast. Multiplanar CT image reconstructions and MIPs were obtained to evaluate the vascular anatomy. Carotid stenosis measurements (when applicable) are obtained utilizing NASCET criteria, using the distal internal carotid diameter as the denominator. CONTRAST:  172mL OMNIPAQUE IOHEXOL 350 MG/ML SOLN COMPARISON:  MRI of the brain October 17, 2015 at 1838 hours. FINDINGS: CTA NECK Aortic arch: Normal appearance of the thoracic arch, normal branch pattern. The origins of the innominate, left Common carotid artery and subclavian artery are widely patent. Right carotid system: Common carotid artery is widely patent, coursing in a straight line fashion. Normal appearance of the carotid bifurcation without hemodynamically significant stenosis by NASCET criteria. Minimal eccentric calcific atherosclerosis and intimal thickening. Normal appearance of the included internal carotid artery. Left carotid system: Common carotid artery is widely patent, coursing in a straight line fashion. Normal appearance of the carotid bifurcation without hemodynamically significant stenosis by NASCET criteria. Minimal eccentric calcific atherosclerosis and intimal thickening. Normal appearance of the included internal carotid artery. Vertebral arteries:Left vertebral artery is dominant. Normal appearance of the vertebral arteries,  which appear widely patent. Skeleton: No acute osseous process though bone windows have not been submitted. Patient is edentulous.  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 No destructive bony lesions. Other neck: Soft tissues of the neck are nonacute though, not tailored for evaluation. Included heart appears mildly enlarged. Prominent main pulmonary artery, incompletely imaged. Calcified hilar lymph nodes. Atrophic versus resected LEFT submandibular gland. Centrally edematous RIGHT submandibular gland without sialolith or inflammatory changes. CTA HEAD Anterior circulation: Patent cervical internal carotid arteries, petrous, cavernous and supra clinoid internal carotid arteries. Moderate calcific atherosclerosis of the carotid siphons. Widely patent anterior communicating artery. Normal appearance of the anterior and middle cerebral arteries. Supernumerary anterior cerebral artery arising from LEFT A1-2 junction. Posterior circulation: Normal appearance of the vertebral arteries, vertebrobasilar junction and basilar artery, as well as main branch vessels. Bilateral posterior communicating arteries contribute to posterior circulation. Normal appearance of the posterior cerebral arteries. No large vessel occlusion, hemodynamically significant stenosis, dissection, luminal irregularity, contrast extravasation or aneurysm within the anterior nor posterior circulation. No abnormal intracranial enhancement on delayed phase. IMPRESSION: CT HEAD: No abnormal intracranial enhancement. CTA NECK: No acute vascular process or hemodynamically significant stenosis. CTA HEAD: No acute large vessel occlusion or high-grade stenosis. Complete circle of Willis. Electronically Signed   By: Elon Alas M.D.   On: 10/17/2015 21:46   Mr Brain Wo Contrast  10/17/2015  CLINICAL DATA:  Code stroke status with new onset of right-sided weakness. EXAM: MRI HEAD WITHOUT CONTRAST TECHNIQUE: Multiplanar, multiecho pulse  sequences of the brain and surrounding structures were obtained without intravenous contrast. COMPARISON:  Head CT same day FINDINGS: No abnormality is seen affecting the brainstem. No acute infarction affecting the cerebellum. I think there are a few old small vessel infarctions within the cerebellum. Cerebral hemispheres show background pattern of a few small foci of T2 and FLAIR signal in the white matter suggesting mild small vessel disease. There are scattered punctate acute infarctions in both hemispheres, much more extensive on the left than the right, largely in a watershed distribution. These could represent a shower of micro embolic emboli or could actually relate to watershed infarction. No large confluent infarction. No mass effect or gross hemorrhage. No hydrocephalus. No extra-axial collection. No pituitary mass. No inflammatory sinus disease. No skull or skullbase lesion. Major vessels at the base of the brain show flow. IMPRESSION: Innumerable punctate infarctions affecting both cerebral hemispheres, much more extensive on the left than the right, which could be due to a shower of micro emboli or watershed infarctions. No large confluent infarction. No mass effect or hemorrhage. Electronically Signed   By: Nelson Chimes M.D.   On: 10/17/2015 19:09    ASSESSMENT AND PLAN  Julia Schwartz is a 65 y.o. female with h/o HTN, anxiety, depression, GERD and current tobacco smoker who had no previous cardiac history admitted 10/17/15 with acute stroke.   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 Seen by [facility] and plan for surgical resection Oct 31, 2015. Recommended LHC prior to surgery. Timing per MD. Continue IV heparin and PT. No chest pain.     Signed, Bhagat,Bhavinkumar PA-C   History and all data above reviewed.  Patient examined.  I agree with the findings as above.  Discussed with the patient.  She has some right shoulder pain.  No chest pain.  No SOB.  The patient  exam reveals COR:RRR  ,  Lungs: Clear  ,  Abd: Positive bowel sounds, no rebound no guarding, Ext No edema  .  All available labs, radiology testing, previous records reviewed. Agree with documented assessment and plan. Atrial myxoma:  Surgery on the 13th.  Plan cardiac cath tomorrow.  The patient understands that risks included but are not limited to stroke (1 in 1000), death (1 in 43), kidney failure [usually temporary] (1 in 500), bleeding (1 in 200), allergic reaction [possibly serious] (1 in 200).  The patient understands and agrees to proceed.   Probably home on Lovenox pending the surgery after the cath.   Tobacco abuse:  Educated patient and family.    Jeneen Rinks [facility]  10:56 AM  10/22/2015

## 2015-10-23 NOTE — Progress Notes (Signed)
ANTICOAGULATION CONSULT NOTE - Follow-up Consult  Pharmacy Consult for Heparin Indication: stroke  Patient Measurements: Height: 5\' 1"  (154.9 cm) Weight: 161 lb 9.6 oz (73.301 kg) IBW/kg (Calculated) : 47.8 Heparin Dosing Weight: 64.1 kg  Vital Signs: Temp: 98.6 F (37 C) (01/04 0522) Temp Source: Oral (01/04 0522) BP: 155/91 mmHg (01/04 1230) Pulse Rate: 71 (01/04 1230)  Labs:  Recent Labs  10/21/15 0345 10/22/15 0545 10/23/15 0747  HGB 11.0* 11.2* 10.7*  HCT 33.8* 34.3* 33.2*  PLT 240 232 230  HEPARINUNFRC 0.47 0.47 0.57     Assessment: 65 yo F presented to ED 12/29 with new R sided weakness. MRI showed bilateral innumerable punctate acute infarcts consistent with embolic showering. 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  S/p cath today, heparin level slightly high this AM  Goal of Therapy:  Heparin level 0.3-0.5 units/ml Monitor platelets by anticoagulation protocol: Yes   Plan:  1. Continue heparin infusion at 750 units/hr starting at 1800 pm tonight 2. HL in am and daily  Thank you Anette Guarneri, PharmD [phone] 10/23/2015, 1:04 PM

## 2015-10-23 NOTE — Progress Notes (Signed)
Patient Name: Julia Schwartz Date of Encounter: 10/23/2015   SUBJECTIVE  Feeling well. No chest pain, sob or palpitations. Post cath.  CURRENT MEDS . amLODipine  5 mg Oral Daily  . atorvastatin  20 mg Oral q1800  . busPIRone  15 mg Oral BID  . DULoxetine  60 mg Oral Daily  . enoxaparin   Does not apply Once  . pantoprazole  80 mg Oral Daily  . pregabalin  150 mg Oral Daily  . pregabalin  75 mg Oral QHS  . sodium chloride  3 mL Intravenous Q12H  . zolpidem  10 mg Oral QHS    OBJECTIVE  Filed Vitals:   10/23/15 1200 10/23/15 1215 10/23/15 1230 10/23/15 1428  BP:  158/91 155/91 124/77  Pulse: 68 69 71 70  Temp:    99.5 F (37.5 C)  TempSrc:    Oral  Resp: 11 8 17 20   Height:      Weight:      SpO2: 99% 99% 100% 99%   No intake or output data in the 24 hours ending 10/23/15 1522 Filed Weights   10/17/15 2153 10/23/15 0515  Weight: 163 lb 11.2 oz (74.254 kg) 161 lb 9.6 oz (73.301 kg)    PHYSICAL EXAM  General: Pleasant, NAD. Neuro: Alert and oriented X 3. Moves all extremities spontaneously. R sided weakness.  Psych: Normal affect. HEENT:  Normal  Neck: Supple without bruits or JVD. Lungs:  Resp regular and unlabored, CTA. Heart: RRR no s3, s4, or murmurs. Abdomen: Soft, non-tender, non-distended, BS + x 4.  Extremities: No clubbing, cyanosis or edema. DP/PT/Radials 2+ and equal bilaterally. R radial cath site without hematoma or bruit.   Accessory Clinical Findings  CBC  Recent Labs  10/22/15 0545 10/23/15 0747  WBC 4.7 4.5  HGB 11.2* 10.7*  HCT 34.3* 33.2*  MCV 88.2 88.3  PLT 232 230   TELE  Sinus rhythm  Procedures    Left Heart Cath and Coronary Angiography 10/23/2015    Conclusion     Normal coronary arteries  Normal left ventricular function   RECOMMENDATIONS:   Per treating team     Radiology/Studies  Ct Angio Head W/cm &/or Wo Cm  10/17/2015  CLINICAL DATA:  Followup stroke symptoms.  History of hypertension. EXAM:  CT ANGIOGRAPHY HEAD AND NECK TECHNIQUE: Multidetector CT imaging of the head and neck was performed using the standard protocol during bolus administration of intravenous contrast. Multiplanar CT image reconstructions and MIPs were obtained to evaluate the vascular anatomy. Carotid stenosis measurements (when applicable) are obtained utilizing NASCET criteria, using the distal internal carotid diameter as the denominator. CONTRAST:  122mL OMNIPAQUE IOHEXOL 350 MG/ML SOLN COMPARISON:  MRI of the brain October 17, 2015 at 1838 hours. FINDINGS: CTA NECK Aortic arch: Normal appearance of the thoracic arch, normal branch pattern. The origins of the innominate, left Common carotid artery and subclavian artery are widely patent. Right carotid system: Common carotid artery is widely patent, coursing in a straight line fashion. Normal appearance of the carotid bifurcation without hemodynamically significant stenosis by NASCET criteria. Minimal eccentric calcific atherosclerosis and intimal thickening. Normal appearance of the included internal carotid artery. Left carotid system: Common carotid artery is widely patent, coursing in a straight line fashion. Normal appearance of the carotid bifurcation without hemodynamically significant stenosis by NASCET criteria. Minimal eccentric calcific atherosclerosis and intimal thickening. Normal appearance of the included internal carotid artery. Vertebral arteries:Left vertebral artery is dominant. Normal appearance of the vertebral arteries, which  appear widely patent. Skeleton: No acute osseous process though bone windows have not been submitted. Patient is edentulous. 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 No destructive bony lesions. Other neck: Soft tissues of the neck are nonacute though, not tailored for evaluation. Included heart appears mildly enlarged. Prominent main pulmonary artery, incompletely imaged. Calcified hilar lymph nodes. Atrophic versus resected LEFT  submandibular gland. Centrally edematous RIGHT submandibular gland without sialolith or inflammatory changes. CTA HEAD Anterior circulation: Patent cervical internal carotid arteries, petrous, cavernous and supra clinoid internal carotid arteries. Moderate calcific atherosclerosis of the carotid siphons. Widely patent anterior communicating artery. Normal appearance of the anterior and middle cerebral arteries. Supernumerary anterior cerebral artery arising from LEFT A1-2 junction. Posterior circulation: Normal appearance of the vertebral arteries, vertebrobasilar junction and basilar artery, as well as main branch vessels. Bilateral posterior communicating arteries contribute to posterior circulation. Normal appearance of the posterior cerebral arteries. No large vessel occlusion, hemodynamically significant stenosis, dissection, luminal irregularity, contrast extravasation or aneurysm within the anterior nor posterior circulation. No abnormal intracranial enhancement on delayed phase. IMPRESSION: CT HEAD: No abnormal intracranial enhancement. CTA NECK: No acute vascular process or hemodynamically significant stenosis. CTA HEAD: No acute large vessel occlusion or high-grade stenosis. Complete circle of Willis. Electronically Signed   By: Elon Alas M.D.   On: 10/17/2015 21:46   Ct Head Wo Contrast  10/17/2015  ADDENDUM REPORT: 10/17/2015 16:40 ADDENDUM: Study discussed by telephone with Dr. Wallie Char on 10/17/2015 at 1624 hours. Electronically Signed   By: Genevie Ann M.D.   On: 10/17/2015 16:40  10/17/2015  CLINICAL DATA:  65 year old female code stroke. Right side weakness and abnormal speech. Last seen normal 1510 hours. Initial encounter. EXAM: CT HEAD WITHOUT CONTRAST TECHNIQUE: Contiguous axial images were obtained from the base of the skull through the vertex without intravenous contrast. COMPARISON:  None. FINDINGS: Visualized paranasal sinuses and mastoids are clear. No acute osseous  abnormality identified. Visualized orbits and scalp soft tissues are within normal limits. Calcified atherosclerosis at the skull base.  Dural calcifications. Asymmetric hyperdensity at the left ICA terminus on series 201, image 7. No cortically based acute infarct identified. No acute intracranial hemorrhage identified. No midline shift, mass effect, or evidence of intracranial mass lesion. Gray-white matter differentiation is within normal limits throughout the brain. IMPRESSION: Suspicious asymmetric hyperdensity at the left ICA terminus, but otherwise normal for age noncontrast CT appearance of the brain. Electronically Signed: By: Genevie Ann M.D. On: 10/17/2015 16:20   Ct Angio Neck W/cm &/or Wo/cm  10/17/2015  CLINICAL DATA:  Followup stroke symptoms.  History of hypertension. EXAM: CT ANGIOGRAPHY HEAD AND NECK TECHNIQUE: Multidetector CT imaging of the head and neck was performed using the standard protocol during bolus administration of intravenous contrast. Multiplanar CT image reconstructions and MIPs were obtained to evaluate the vascular anatomy. Carotid stenosis measurements (when applicable) are obtained utilizing NASCET criteria, using the distal internal carotid diameter as the denominator. CONTRAST:  18mL OMNIPAQUE IOHEXOL 350 MG/ML SOLN COMPARISON:  MRI of the brain October 17, 2015 at 1838 hours. FINDINGS: CTA NECK Aortic arch: Normal appearance of the thoracic arch, normal branch pattern. The origins of the innominate, left Common carotid artery and subclavian artery are widely patent. Right carotid system: Common carotid artery is widely patent, coursing in a straight line fashion. Normal appearance of the carotid bifurcation without hemodynamically significant stenosis by NASCET criteria. Minimal eccentric calcific atherosclerosis and intimal thickening. Normal appearance of the included internal carotid artery. Left carotid system: Common carotid  artery is widely patent, coursing in a  straight line fashion. Normal appearance of the carotid bifurcation without hemodynamically significant stenosis by NASCET criteria. Minimal eccentric calcific atherosclerosis and intimal thickening. Normal appearance of the included internal carotid artery. Vertebral arteries:Left vertebral artery is dominant. Normal appearance of the vertebral arteries, which appear widely patent. Skeleton: No acute osseous process though bone windows have not been submitted. Patient is edentulous. 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 No destructive bony lesions. Other neck: Soft tissues of the neck are nonacute though, not tailored for evaluation. Included heart appears mildly enlarged. Prominent main pulmonary artery, incompletely imaged. Calcified hilar lymph nodes. Atrophic versus resected LEFT submandibular gland. Centrally edematous RIGHT submandibular gland without sialolith or inflammatory changes. CTA HEAD Anterior circulation: Patent cervical internal carotid arteries, petrous, cavernous and supra clinoid internal carotid arteries. Moderate calcific atherosclerosis of the carotid siphons. Widely patent anterior communicating artery. Normal appearance of the anterior and middle cerebral arteries. Supernumerary anterior cerebral artery arising from LEFT A1-2 junction. Posterior circulation: Normal appearance of the vertebral arteries, vertebrobasilar junction and basilar artery, as well as main branch vessels. Bilateral posterior communicating arteries contribute to posterior circulation. Normal appearance of the posterior cerebral arteries. No large vessel occlusion, hemodynamically significant stenosis, dissection, luminal irregularity, contrast extravasation or aneurysm within the anterior nor posterior circulation. No abnormal intracranial enhancement on delayed phase. IMPRESSION: CT HEAD: No abnormal intracranial enhancement. CTA NECK: No acute vascular process or hemodynamically significant stenosis. CTA  HEAD: No acute large vessel occlusion or high-grade stenosis. Complete circle of Willis. Electronically Signed   By: Elon Alas M.D.   On: 10/17/2015 21:46   Mr Brain Wo Contrast  10/17/2015  CLINICAL DATA:  Code stroke status with new onset of right-sided weakness. EXAM: MRI HEAD WITHOUT CONTRAST TECHNIQUE: Multiplanar, multiecho pulse sequences of the brain and surrounding structures were obtained without intravenous contrast. COMPARISON:  Head CT same day FINDINGS: No abnormality is seen affecting the brainstem. No acute infarction affecting the cerebellum. I think there are a few old small vessel infarctions within the cerebellum. Cerebral hemispheres show background pattern of a few small foci of T2 and FLAIR signal in the white matter suggesting mild small vessel disease. There are scattered punctate acute infarctions in both hemispheres, much more extensive on the left than the right, largely in a watershed distribution. These could represent a shower of micro embolic emboli or could actually relate to watershed infarction. No large confluent infarction. No mass effect or gross hemorrhage. No hydrocephalus. No extra-axial collection. No pituitary mass. No inflammatory sinus disease. No skull or skullbase lesion. Major vessels at the base of the brain show flow. IMPRESSION: Innumerable punctate infarctions affecting both cerebral hemispheres, much more extensive on the left than the right, which could be due to a shower of micro emboli or watershed infarctions. No large confluent infarction. No mass effect or hemorrhage. Electronically Signed   By: Nelson Chimes M.D.   On: 10/17/2015 19:09    ASSESSMENT AND PLAN  Julia Schwartz is a 65 y.o. female with h/o HTN, anxiety, depression, GERD and current tobacco smoker who had no previous cardiac history admitted 10/17/15 with acute stroke.   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 Seen by [facility] and  plan for surgical resection Oct 31, 2015. Cath showed normal coronaries. Plan to discharge home on Lovenox.   Signed,  Bhagat,Bhavinkumar  3:22 PM  10/23/2015  History and all data above reviewed.  Patient examined.  I agree with the findings as above.  Post cath.  No pain.  No SOB.   The patient exam reveals COR:RRR  ,  Lungs: Clear  ,  Abd: Positive bowel sounds, no rebound no guarding, Ext No edema, right wrist without bleeding.   .  All available labs, radiology testing, previous records reviewed. Agree with documented assessment and plan. Atrial myxoma:  Plan home on Lovenox pending surgical resection by Dr. Prescott Gum.    Jeneen Rinks Stori Royse  6:01 PM  10/23/2015

## 2015-10-23 NOTE — Progress Notes (Signed)
1410-1430 Pt sleepy from cath so did not walk. Gave pt OHS booklet and care guide and discussed importance of walking and IS after surgery. Discussed sternal precautions. Pt stated would have someone to help with her care after discharge from surgery. Wrote down how to view pre op video when more awake. Needs IS to take home if she is to use it during interim. Graylon Good RN BSN 10/23/2015 2:27 PM

## 2015-10-23 NOTE — Progress Notes (Signed)
Patient returned from Cath lab. Patient alert and oriented. Dressing clean and intact.

## 2015-10-23 NOTE — Progress Notes (Signed)
Lovenox education given to patient, patient verbalized understanding

## 2015-10-23 NOTE — Progress Notes (Addendum)
TR BAND REMOVAL  LOCATION:    Right radial  DEFLATED PER PROTOCOL:    Yes.    TIME BAND OFF / DRESSING APPLIED:    1230p   SITE UPON ARRIVAL:    Level 0  SITE AFTER BAND REMOVAL:    Level 0  CIRCULATION SENSATION AND MOVEMENT:    Within Normal Limits   Yes.    COMMENTS:   VS remain stable .  Right radial site a level 0.  After care instructions given .  Back pain level is a 4 from a level 8

## 2015-10-23 NOTE — Progress Notes (Signed)
PATIENT DETAILS Name: Julia Schwartz Age: 65 y.o. Sex: female Date of Birth: 1951/01/15 Admit Date: 10/17/2015 Admitting Physician Etta Quill, DO PCP:No primary care provider on file.  Subjective: Right-sided weakness essentially unchanged. 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 Cardiothoracic surgery was also consulted, tentatively scheduled for surgery on Jan 13. After discussing with both cardiology and  neurology, transitioned to Lovenox. Plans are to discharge home with home services on 1/5.  Active Problems: Hypokalemia: Repleted,recheck periodically  HTN (hypertension):  all antihypertensives were held, and mild permissive hypertension was allowed. We will slowly resume antihypertensives-start amlodipine 5 mg today. Follow-up and adjust medications accordingly.   GERD: Continue PPI  Anxiety/depression: appears stable-continue with BuSpar, Cymbalta, Remeron  Disposition: Remain inpatient- home on 1/5   Antimicrobial agents  See below  Anti-infectives    Start     Dose/Rate Route Frequency Ordered Stop   10/22/15 0653  ceFAZolin (ANCEF) 2-3 GM-% IVPB SOLR    Comments:  Ara Kussmaul   : cabinet override      10/22/15 0653 10/22/15 1859      DVT Prophylaxis: Heparin gtt  Code Status: Full code   Family Communication  sons bedside  Procedures: TEE 12/30  CONSULTS:  cardiology and neurology   CT VS  Time spent 25 minutes-Greater than 50% of this time was spent in counseling, explanation of diagnosis, planning of further management, and coordination of care.  MEDICATIONS: Scheduled Meds: . atorvastatin  20 mg Oral q1800  . busPIRone  15 mg Oral BID  . DULoxetine  60 mg Oral  Daily  . enoxaparin   Does not apply Once  . pantoprazole  80 mg Oral Daily  . pregabalin  150 mg Oral Daily  . pregabalin  75 mg Oral QHS  . sodium chloride  3 mL Intravenous Q12H  . zolpidem  10 mg Oral QHS   Continuous Infusions: . heparin     PRN Meds:.sodium chloride, acetaminophen, HYDROcodone-acetaminophen, ondansetron (ZOFRAN) IV, sodium chloride, tiZANidine    PHYSICAL EXAM: Vital signs in last 24 hours: Filed Vitals:   10/23/15 1200 10/23/15 1215 10/23/15 1230 10/23/15 1428  BP:  158/91 155/91 124/77  Pulse: 68 69 71 70  Temp:    99.5 F (37.5 C)  TempSrc:    Oral  Resp: 11 8 17 20   Height:      Weight:      SpO2: 99% 99% 100% 99%    Weight change:  Filed Weights   10/17/15 2153 10/23/15 0515  Weight: 74.254 kg (163 lb 11.2 oz) 73.301 kg (161 lb 9.6 oz)   Body mass index is 30.55 kg/(m^2).   Gen Exam: Awake-slight dysarthria Neck: Supple, No JVD.   Chest: B/L Clear.  No rales CVS: S1 S2 Regular, no murmurs.  Abdomen: soft, BS +, non tender, non distended.  Extremities: no edema, lower extremities warm to touch. Neurologic: RUE/RLE 4/5   Skin: No Rash.   Wounds: N/A.    Intake/Output from previous day: No intake or output data in the 24 hours ending 10/23/15 1510   LAB RESULTS: CBC  Recent Labs Lab 10/17/15 1606  10/19/15 0250 10/20/15 0305 10/21/15 0345 10/22/15 0545 10/23/15 0747  WBC 6.1  --  5.3 5.1 4.7 4.7 4.5  HGB 12.3  < > 10.5* 10.9* 11.0* 11.2* 10.7*  HCT 39.4  < > 32.4* 32.7* 33.8* 34.3* 33.2*  PLT 208  --  237 215 240 232 230  MCV 92.1  --  89.5 88.9 88.0 88.2 88.3  MCH 28.7  --  29.0 29.6 28.6 28.8 28.5  MCHC 31.2  --  32.4 33.3 32.5 32.7 32.2  RDW 14.0  --  13.8 13.7 13.4 13.3 13.3  LYMPHSABS 3.3  --   --   --   --   --   --   MONOABS 0.7  --   --   --   --   --   --   EOSABS 0.2  --   --   --   --   --   --   BASOSABS 0.0  --   --   --   --   --   --   < > = values in this interval not displayed.  Chemistries    Recent Labs Lab 10/17/15 1606 10/17/15 1610 10/19/15 0250  NA 140 143 142  K 3.0* 2.9* 3.7  CL 109 108 115*  CO2 19*  --  20*  GLUCOSE 125* 122* 95  BUN 8 11 5*  CREATININE 1.17* 1.10* 0.85  CALCIUM 9.2  --  9.0  MG  --   --  1.5*    CBG:  Recent Labs Lab 10/17/15 1615  GLUCAP 108*    GFR Estimated Creatinine Clearance: 62 mL/min (by C-G formula based on Cr of 0.85).  Coagulation profile  Recent Labs Lab 10/17/15 1606  INR 1.08    Cardiac Enzymes No results for input(s): CKMB, TROPONINI, MYOGLOBIN in the last 168 hours.  Invalid input(s): CK  Invalid input(s): POCBNP No results for input(s): DDIMER in the last 72 hours. No results for input(s): HGBA1C in the last 72 hours. No results for input(s): CHOL, HDL, LDLCALC, TRIG, CHOLHDL, LDLDIRECT in the last 72 hours. No results for input(s): TSH, T4TOTAL, T3FREE, THYROIDAB in the last 72 hours.  Invalid input(s): FREET3 No results for input(s): VITAMINB12, FOLATE, FERRITIN, TIBC, IRON, RETICCTPCT in the last 72 hours. No results for input(s): LIPASE, AMYLASE in the last 72 hours.  Urine Studies No results for input(s): UHGB, CRYS in the last 72 hours.  Invalid input(s): UACOL, UAPR, USPG, UPH, UTP, UGL, UKET, UBIL, UNIT, UROB, ULEU, UEPI, UWBC, URBC, UBAC, CAST, UCOM, BILUA  MICROBIOLOGY: Recent Results (from the past 240 hour(s))  Culture, blood (routine x 2)     Status: None   Collection Time: 10/17/15 10:20 PM  Result Value Ref Range Status   Specimen Description BLOOD LEFT HAND  Final   Special Requests BOTTLES DRAWN AEROBIC ONLY [place]  Final   Culture NO GROWTH 5 DAYS  Final   Report Status 10/23/2015 FINAL  Final  Culture, blood (routine x 2)     Status: None   Collection Time: 10/17/15 10:28 PM  Result Value Ref Range Status   Specimen Description BLOOD RIGHT ANTECUBITAL  Final   Special Requests BOTTLES DRAWN AEROBIC AND ANAEROBIC 5CC   Final   Culture NO GROWTH 5 DAYS  Final   Report Status  10/23/2015 FINAL  Final    RADIOLOGY STUDIES/RESULTS: Ct Angio Head W/cm &/or Wo Cm  10/17/2015  CLINICAL DATA:  Followup stroke symptoms.  History of hypertension. EXAM: CT ANGIOGRAPHY HEAD AND NECK TECHNIQUE: Multidetector CT imaging of the head and neck was  performed using the standard protocol during bolus administration of intravenous contrast. Multiplanar CT image reconstructions and MIPs were obtained to evaluate the vascular anatomy. Carotid stenosis measurements (when applicable) are obtained utilizing NASCET criteria, using the distal internal carotid diameter as the denominator. CONTRAST:  144mL OMNIPAQUE IOHEXOL 350 MG/ML SOLN COMPARISON:  MRI of the brain October 17, 2015 at 1838 hours. FINDINGS: CTA NECK Aortic arch: Normal appearance of the thoracic arch, normal branch pattern. The origins of the innominate, left Common carotid artery and subclavian artery are widely patent. Right carotid system: Common carotid artery is widely patent, coursing in a straight line fashion. Normal appearance of the carotid bifurcation without hemodynamically significant stenosis by NASCET criteria. Minimal eccentric calcific atherosclerosis and intimal thickening. Normal appearance of the included internal carotid artery. Left carotid system: Common carotid artery is widely patent, coursing in a straight line fashion. Normal appearance of the carotid bifurcation without hemodynamically significant stenosis by NASCET criteria. Minimal eccentric calcific atherosclerosis and intimal thickening. Normal appearance of the included internal carotid artery. Vertebral arteries:Left vertebral artery is dominant. Normal appearance of the vertebral arteries, which appear widely patent. Skeleton: No acute osseous process though bone windows have not been submitted. Patient is edentulous. 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 No destructive bony lesions. Other neck: Soft tissues of the neck are nonacute though, not  tailored for evaluation. Included heart appears mildly enlarged. Prominent main pulmonary artery, incompletely imaged. Calcified hilar lymph nodes. Atrophic versus resected LEFT submandibular gland. Centrally edematous RIGHT submandibular gland without sialolith or inflammatory changes. CTA HEAD Anterior circulation: Patent cervical internal carotid arteries, petrous, cavernous and supra clinoid internal carotid arteries. Moderate calcific atherosclerosis of the carotid siphons. Widely patent anterior communicating artery. Normal appearance of the anterior and middle cerebral arteries. Supernumerary anterior cerebral artery arising from LEFT A1-2 junction. Posterior circulation: Normal appearance of the vertebral arteries, vertebrobasilar junction and basilar artery, as well as main branch vessels. Bilateral posterior communicating arteries contribute to posterior circulation. Normal appearance of the posterior cerebral arteries. No large vessel occlusion, hemodynamically significant stenosis, dissection, luminal irregularity, contrast extravasation or aneurysm within the anterior nor posterior circulation. No abnormal intracranial enhancement on delayed phase. IMPRESSION: CT HEAD: No abnormal intracranial enhancement. CTA NECK: No acute vascular process or hemodynamically significant stenosis. CTA HEAD: No acute large vessel occlusion or high-grade stenosis. Complete circle of Willis. Electronically Signed   By: Elon Alas M.D.   On: 10/17/2015 21:46   Ct Head Wo Contrast  10/17/2015  ADDENDUM REPORT: 10/17/2015 16:40 ADDENDUM: Study discussed by telephone with Dr. Wallie Char on 10/17/2015 at 1624 hours. Electronically Signed   By: Genevie Ann M.D.   On: 10/17/2015 16:40  10/17/2015  CLINICAL DATA:  65 year old female code stroke. Right side weakness and abnormal speech. Last seen normal 1510 hours. Initial encounter. EXAM: CT HEAD WITHOUT CONTRAST TECHNIQUE: Contiguous axial images were obtained from  the base of the skull through the vertex without intravenous contrast. COMPARISON:  None. FINDINGS: Visualized paranasal sinuses and mastoids are clear. No acute osseous abnormality identified. Visualized orbits and scalp soft tissues are within normal limits. Calcified atherosclerosis at the skull base.  Dural calcifications. Asymmetric hyperdensity at the left ICA terminus on series 201, image 7. No cortically based acute infarct identified. No acute intracranial hemorrhage identified. No midline shift, mass effect, or evidence of intracranial mass lesion. Gray-white matter differentiation is within normal limits throughout the brain. IMPRESSION: Suspicious asymmetric hyperdensity at the left ICA terminus, but otherwise normal for age noncontrast CT  appearance of the brain. Electronically Signed: By: Genevie Ann M.D. On: 10/17/2015 16:20   Ct Angio Neck W/cm &/or Wo/cm  10/17/2015  CLINICAL DATA:  Followup stroke symptoms.  History of hypertension. EXAM: CT ANGIOGRAPHY HEAD AND NECK TECHNIQUE: Multidetector CT imaging of the head and neck was performed using the standard protocol during bolus administration of intravenous contrast. Multiplanar CT image reconstructions and MIPs were obtained to evaluate the vascular anatomy. Carotid stenosis measurements (when applicable) are obtained utilizing NASCET criteria, using the distal internal carotid diameter as the denominator. CONTRAST:  168mL OMNIPAQUE IOHEXOL 350 MG/ML SOLN COMPARISON:  MRI of the brain October 17, 2015 at 1838 hours. FINDINGS: CTA NECK Aortic arch: Normal appearance of the thoracic arch, normal branch pattern. The origins of the innominate, left Common carotid artery and subclavian artery are widely patent. Right carotid system: Common carotid artery is widely patent, coursing in a straight line fashion. Normal appearance of the carotid bifurcation without hemodynamically significant stenosis by NASCET criteria. Minimal eccentric calcific  atherosclerosis and intimal thickening. Normal appearance of the included internal carotid artery. Left carotid system: Common carotid artery is widely patent, coursing in a straight line fashion. Normal appearance of the carotid bifurcation without hemodynamically significant stenosis by NASCET criteria. Minimal eccentric calcific atherosclerosis and intimal thickening. Normal appearance of the included internal carotid artery. Vertebral arteries:Left vertebral artery is dominant. Normal appearance of the vertebral arteries, which appear widely patent. Skeleton: No acute osseous process though bone windows have not been submitted. Patient is edentulous. 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 No destructive bony lesions. Other neck: Soft tissues of the neck are nonacute though, not tailored for evaluation. Included heart appears mildly enlarged. Prominent main pulmonary artery, incompletely imaged. Calcified hilar lymph nodes. Atrophic versus resected LEFT submandibular gland. Centrally edematous RIGHT submandibular gland without sialolith or inflammatory changes. CTA HEAD Anterior circulation: Patent cervical internal carotid arteries, petrous, cavernous and supra clinoid internal carotid arteries. Moderate calcific atherosclerosis of the carotid siphons. Widely patent anterior communicating artery. Normal appearance of the anterior and middle cerebral arteries. Supernumerary anterior cerebral artery arising from LEFT A1-2 junction. Posterior circulation: Normal appearance of the vertebral arteries, vertebrobasilar junction and basilar artery, as well as main branch vessels. Bilateral posterior communicating arteries contribute to posterior circulation. Normal appearance of the posterior cerebral arteries. No large vessel occlusion, hemodynamically significant stenosis, dissection, luminal irregularity, contrast extravasation or aneurysm within the anterior nor posterior circulation. No abnormal  intracranial enhancement on delayed phase. IMPRESSION: CT HEAD: No abnormal intracranial enhancement. CTA NECK: No acute vascular process or hemodynamically significant stenosis. CTA HEAD: No acute large vessel occlusion or high-grade stenosis. Complete circle of Willis. Electronically Signed   By: Elon Alas M.D.   On: 10/17/2015 21:46   Mr Brain Wo Contrast  10/17/2015  CLINICAL DATA:  Code stroke status with new onset of right-sided weakness. EXAM: MRI HEAD WITHOUT CONTRAST TECHNIQUE: Multiplanar, multiecho pulse sequences of the brain and surrounding structures were obtained without intravenous contrast. COMPARISON:  Head CT same day FINDINGS: No abnormality is seen affecting the brainstem. No acute infarction affecting the cerebellum. I think there are a few old small vessel infarctions within the cerebellum. Cerebral hemispheres show background pattern of a few small foci of T2 and FLAIR signal in the white matter suggesting mild small vessel disease. There are scattered punctate acute infarctions in both hemispheres, much more extensive on the left than the right, largely in a watershed distribution. These could represent a shower of micro embolic emboli  or could actually relate to watershed infarction. No large confluent infarction. No mass effect or gross hemorrhage. No hydrocephalus. No extra-axial collection. No pituitary mass. No inflammatory sinus disease. No skull or skullbase lesion. Major vessels at the base of the brain show flow. IMPRESSION: Innumerable punctate infarctions affecting both cerebral hemispheres, much more extensive on the left than the right, which could be due to a shower of micro emboli or watershed infarctions. No large confluent infarction. No mass effect or hemorrhage. Electronically Signed   By: Nelson Chimes M.D.   On: 10/17/2015 19:09    Oren Binet, MD  [facility] Pager:[phone]  If 7PM-7AM, please contact  night-coverage [email or web address] Password [email or web address] 10/23/2015, 3:10 PM   LOS: 6 days

## 2015-10-23 NOTE — Interval H&P Note (Signed)
Cath Lab Visit (complete for each Cath Lab visit)  Clinical Evaluation Leading to the Procedure:   ACS: No.  Non-ACS:    Anginal Classification: No Symptoms  Anti-ischemic medical therapy: No Therapy  Non-Invasive Test Results: No non-invasive testing performed  Prior CABG: No previous CABG      History and Physical Interval Note:  10/23/2015 9:36 AM  Julia Schwartz  has presented today for surgery, with the diagnosis of cp  The various methods of treatment have been discussed with the patient and family. After consideration of risks, benefits and other options for treatment, the patient has consented to  Procedure(s): Left Heart Cath and Coronary Angiography (N/A) as a surgical intervention .  The patient's history has been reviewed, patient examined, no change in status, stable for surgery.  I have reviewed the patient's chart and labs.  Questions were answered to the patient's satisfaction.     Sinclair Grooms

## 2015-10-24 ENCOUNTER — Other Ambulatory Visit: Payer: Self-pay | Admitting: *Deleted

## 2015-10-24 ENCOUNTER — Inpatient Hospital Stay (HOSPITAL_COMMUNITY): Payer: Medicare Other

## 2015-10-24 DIAGNOSIS — D151 Benign neoplasm of heart: Secondary | ICD-10-CM

## 2015-10-24 DIAGNOSIS — D219 Benign neoplasm of connective and other soft tissue, unspecified: Secondary | ICD-10-CM

## 2015-10-24 LAB — CBC
HCT: 32.5 % — ABNORMAL LOW (ref 36.0–46.0)
Hemoglobin: 10.7 g/dL — ABNORMAL LOW (ref 12.0–15.0)
MCH: 29.5 pg (ref 26.0–34.0)
MCHC: 32.9 g/dL (ref 30.0–36.0)
MCV: 89.5 fL (ref 78.0–100.0)
Platelets: 204 K/uL (ref 150–400)
RBC: 3.63 MIL/uL — ABNORMAL LOW (ref 3.87–5.11)
RDW: 13.5 % (ref 11.5–15.5)
WBC: 5 K/uL (ref 4.0–10.5)

## 2015-10-24 LAB — PROTEIN C ACTIVITY: Protein C Activity: 101 % (ref 73–180)

## 2015-10-24 LAB — LUPUS ANTICOAGULANT PANEL
DRVVT: 38.3 s (ref 0.0–44.0)
PTT LA: 47.1 s — AB (ref 0.0–40.6)

## 2015-10-24 LAB — FACTOR 5 LEIDEN

## 2015-10-24 LAB — PROTEIN S, TOTAL: PROTEIN S AG TOTAL: 83 % (ref 60–150)

## 2015-10-24 LAB — HEXAGONAL PHASE PHOSPHOLIPID: Hexagonal Phase Phospholipid: 17 s — ABNORMAL HIGH (ref 0–11)

## 2015-10-24 LAB — PTT-LA MIX: PTT-LA MIX: 43.4 s — AB (ref 0.0–40.6)

## 2015-10-24 LAB — PROTEIN S ACTIVITY: PROTEIN S ACTIVITY: 42 % — AB (ref 63–140)

## 2015-10-24 LAB — HEPARIN LEVEL (UNFRACTIONATED): Heparin Unfractionated: 0.85 [IU]/mL — ABNORMAL HIGH (ref 0.30–0.70)

## 2015-10-24 MED ORDER — OMEPRAZOLE 40 MG PO CPDR: 40.0000 mg | DELAYED_RELEASE_CAPSULE | Freq: Every day | ORAL | Status: DC

## 2015-10-24 MED ORDER — ENOXAPARIN SODIUM 100 MG/ML ~~LOC~~ SOLN: 100.0000 mg | SUBCUTANEOUS | Status: DC

## 2015-10-24 MED ORDER — ATORVASTATIN CALCIUM 20 MG PO TABS: 20.0000 mg | ORAL_TABLET | Freq: Every day | ORAL | Status: DC

## 2015-10-24 NOTE — Discharge Summary (Signed)
PATIENT DETAILS Name: Julia Schwartz Age: 65 y.o. Sex: female Date of Birth: 10-26-1950 MRN: [id number]. Admitting Physician: Etta Quill, DO PCP:No primary care provider on file.  Admit Date: 10/17/2015 Discharge date: 10/24/2015  Recommendations for Outpatient Follow-up:  1. Ensure follow-up with cardiothoracic surgery-patient is scheduled for OR on 11/01/15  2. On Lovenox-therapeutic dosing-till patient undergoes cardiothoracic surgery.  3. Ensure follow-up with neurology and cardiology.   PRIMARY DISCHARGE DIAGNOSIS:  Principal Problem:   Embolic stroke (HCC) Active Problems:   HTN (hypertension)   Myxoma of heart   HLD (hyperlipidemia)   Tobacco use disorder   Hypokalemia   Tobacco abuse   Fibromyalgia   Hypomagnesemia   Stroke [facility])      PAST MEDICAL HISTORY: Past Medical History  Diagnosis Date  . Hypertension   . Anxiety   . Depression   . Arthritis   . GERD (gastroesophageal reflux disease)   . Chronic back pain   . Wears dentures     top-partial bottom  . Wears glasses   . Fibromyalgia     DISCHARGE MEDICATIONS: Current Discharge Medication List    START taking these medications   Details  atorvastatin (LIPITOR) 20 MG tablet Take 1 tablet (20 mg total) by mouth daily at 6 PM. Qty: 30 tablet, Refills: 0    enoxaparin (LOVENOX) 100 MG/ML injection Inject 1 mL (100 mg total) into the skin daily. Qty: 8 Syringe, Refills: 0      CONTINUE these medications which have CHANGED   Details  omeprazole (PRILOSEC) 40 MG capsule Take 1 capsule (40 mg total) by mouth daily. Qty: 30 capsule, Refills: 0      CONTINUE these medications which have NOT CHANGED   Details  amLODipine-benazepril (LOTREL) 10-40 MG per capsule Take 1 capsule by mouth daily.    busPIRone (BUSPAR) 15 MG tablet Take 15 mg by mouth 2 (two) times daily.     DULoxetine (CYMBALTA) 60 MG capsule Take 60 mg by mouth daily.    furosemide (LASIX) 20 MG tablet Take 20 mg by mouth  daily.    HYDROcodone-acetaminophen (NORCO) 10-325 MG per tablet Take 1 tablet by mouth every 6 (six) hours as needed for moderate pain or severe pain.     mirtazapine (REMERON) 15 MG tablet Take 15 mg by mouth at bedtime.    pregabalin (LYRICA) 75 MG capsule Take 75-150 mg by mouth 2 (two) times daily. 150mg  in the morning and 75mg  in the evening    tiZANidine (ZANAFLEX) 4 MG capsule Take 4 mg by mouth 3 (three) times daily as needed for muscle spasms.     zolpidem (AMBIEN) 10 MG tablet Take 10 mg by mouth at bedtime.       STOP taking these medications     diclofenac (VOLTAREN) 75 MG EC tablet         ALLERGIES:   Allergies  Allergen Reactions  . Ultram [Tramadol] Hives    BRIEF HPI:  See H&P, Labs, Consult and Test reports for all details in brief, patient was admitted for evaluation of right-sided weakness. MRI confirmed embolic CVA. See below for further details  CONSULTATIONS:   cardiology, neurology and Cardiothoracic surgery  PERTINENT RADIOLOGIC STUDIES: Ct Angio Head W/cm &/or Wo Cm  10/17/2015  CLINICAL DATA:  Followup stroke symptoms.  History of hypertension. EXAM: CT ANGIOGRAPHY HEAD AND NECK TECHNIQUE: Multidetector CT imaging of the head and neck was performed using the standard protocol during bolus administration of intravenous contrast. Multiplanar CT  image reconstructions and MIPs were obtained to evaluate the vascular anatomy. Carotid stenosis measurements (when applicable) are obtained utilizing NASCET criteria, using the distal internal carotid diameter as the denominator. CONTRAST:  166mL OMNIPAQUE IOHEXOL 350 MG/ML SOLN COMPARISON:  MRI of the brain October 17, 2015 at 1838 hours. FINDINGS: CTA NECK Aortic arch: Normal appearance of the thoracic arch, normal branch pattern. The origins of the innominate, left Common carotid artery and subclavian artery are widely patent. Right carotid system: Common carotid artery is widely patent, coursing in a straight  line fashion. Normal appearance of the carotid bifurcation without hemodynamically significant stenosis by NASCET criteria. Minimal eccentric calcific atherosclerosis and intimal thickening. Normal appearance of the included internal carotid artery. Left carotid system: Common carotid artery is widely patent, coursing in a straight line fashion. Normal appearance of the carotid bifurcation without hemodynamically significant stenosis by NASCET criteria. Minimal eccentric calcific atherosclerosis and intimal thickening. Normal appearance of the included internal carotid artery. Vertebral arteries:Left vertebral artery is dominant. Normal appearance of the vertebral arteries, which appear widely patent. Skeleton: No acute osseous process though bone windows have not been submitted. Patient is edentulous. 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 No destructive bony lesions. Other neck: Soft tissues of the neck are nonacute though, not tailored for evaluation. Included heart appears mildly enlarged. Prominent main pulmonary artery, incompletely imaged. Calcified hilar lymph nodes. Atrophic versus resected LEFT submandibular gland. Centrally edematous RIGHT submandibular gland without sialolith or inflammatory changes. CTA HEAD Anterior circulation: Patent cervical internal carotid arteries, petrous, cavernous and supra clinoid internal carotid arteries. Moderate calcific atherosclerosis of the carotid siphons. Widely patent anterior communicating artery. Normal appearance of the anterior and middle cerebral arteries. Supernumerary anterior cerebral artery arising from LEFT A1-2 junction. Posterior circulation: Normal appearance of the vertebral arteries, vertebrobasilar junction and basilar artery, as well as main branch vessels. Bilateral posterior communicating arteries contribute to posterior circulation. Normal appearance of the posterior cerebral arteries. No large vessel occlusion, hemodynamically  significant stenosis, dissection, luminal irregularity, contrast extravasation or aneurysm within the anterior nor posterior circulation. No abnormal intracranial enhancement on delayed phase. IMPRESSION: CT HEAD: No abnormal intracranial enhancement. CTA NECK: No acute vascular process or hemodynamically significant stenosis. CTA HEAD: No acute large vessel occlusion or high-grade stenosis. Complete circle of Willis. Electronically Signed   By: Elon Alas M.D.   On: 10/17/2015 21:46   Ct Head Wo Contrast  10/17/2015  ADDENDUM REPORT: 10/17/2015 16:40 ADDENDUM: Study discussed by telephone with Dr. Wallie Char on 10/17/2015 at 1624 hours. Electronically Signed   By: Genevie Ann M.D.   On: 10/17/2015 16:40  10/17/2015  CLINICAL DATA:  65 year old female code stroke. Right side weakness and abnormal speech. Last seen normal 1510 hours. Initial encounter. EXAM: CT HEAD WITHOUT CONTRAST TECHNIQUE: Contiguous axial images were obtained from the base of the skull through the vertex without intravenous contrast. COMPARISON:  None. FINDINGS: Visualized paranasal sinuses and mastoids are clear. No acute osseous abnormality identified. Visualized orbits and scalp soft tissues are within normal limits. Calcified atherosclerosis at the skull base.  Dural calcifications. Asymmetric hyperdensity at the left ICA terminus on series 201, image 7. No cortically based acute infarct identified. No acute intracranial hemorrhage identified. No midline shift, mass effect, or evidence of intracranial mass lesion. Gray-white matter differentiation is within normal limits throughout the brain. IMPRESSION: Suspicious asymmetric hyperdensity at the left ICA terminus, but otherwise normal for age noncontrast CT appearance of the brain. Electronically Signed: By: Genevie Ann M.D. On: 10/17/2015  16:20   Ct Angio Neck W/cm &/or Wo/cm  10/17/2015  CLINICAL DATA:  Followup stroke symptoms.  History of hypertension. EXAM: CT ANGIOGRAPHY  HEAD AND NECK TECHNIQUE: Multidetector CT imaging of the head and neck was performed using the standard protocol during bolus administration of intravenous contrast. Multiplanar CT image reconstructions and MIPs were obtained to evaluate the vascular anatomy. Carotid stenosis measurements (when applicable) are obtained utilizing NASCET criteria, using the distal internal carotid diameter as the denominator. CONTRAST:  164mL OMNIPAQUE IOHEXOL 350 MG/ML SOLN COMPARISON:  MRI of the brain October 17, 2015 at 1838 hours. FINDINGS: CTA NECK Aortic arch: Normal appearance of the thoracic arch, normal branch pattern. The origins of the innominate, left Common carotid artery and subclavian artery are widely patent. Right carotid system: Common carotid artery is widely patent, coursing in a straight line fashion. Normal appearance of the carotid bifurcation without hemodynamically significant stenosis by NASCET criteria. Minimal eccentric calcific atherosclerosis and intimal thickening. Normal appearance of the included internal carotid artery. Left carotid system: Common carotid artery is widely patent, coursing in a straight line fashion. Normal appearance of the carotid bifurcation without hemodynamically significant stenosis by NASCET criteria. Minimal eccentric calcific atherosclerosis and intimal thickening. Normal appearance of the included internal carotid artery. Vertebral arteries:Left vertebral artery is dominant. Normal appearance of the vertebral arteries, which appear widely patent. Skeleton: No acute osseous process though bone windows have not been submitted. Patient is edentulous. 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 No destructive bony lesions. Other neck: Soft tissues of the neck are nonacute though, not tailored for evaluation. Included heart appears mildly enlarged. Prominent main pulmonary artery, incompletely imaged. Calcified hilar lymph nodes. Atrophic versus resected LEFT submandibular  gland. Centrally edematous RIGHT submandibular gland without sialolith or inflammatory changes. CTA HEAD Anterior circulation: Patent cervical internal carotid arteries, petrous, cavernous and supra clinoid internal carotid arteries. Moderate calcific atherosclerosis of the carotid siphons. Widely patent anterior communicating artery. Normal appearance of the anterior and middle cerebral arteries. Supernumerary anterior cerebral artery arising from LEFT A1-2 junction. Posterior circulation: Normal appearance of the vertebral arteries, vertebrobasilar junction and basilar artery, as well as main branch vessels. Bilateral posterior communicating arteries contribute to posterior circulation. Normal appearance of the posterior cerebral arteries. No large vessel occlusion, hemodynamically significant stenosis, dissection, luminal irregularity, contrast extravasation or aneurysm within the anterior nor posterior circulation. No abnormal intracranial enhancement on delayed phase. IMPRESSION: CT HEAD: No abnormal intracranial enhancement. CTA NECK: No acute vascular process or hemodynamically significant stenosis. CTA HEAD: No acute large vessel occlusion or high-grade stenosis. Complete circle of Willis. Electronically Signed   By: Elon Alas M.D.   On: 10/17/2015 21:46   Mr Brain Wo Contrast  10/17/2015  CLINICAL DATA:  Code stroke status with new onset of right-sided weakness. EXAM: MRI HEAD WITHOUT CONTRAST TECHNIQUE: Multiplanar, multiecho pulse sequences of the brain and surrounding structures were obtained without intravenous contrast. COMPARISON:  Head CT same day FINDINGS: No abnormality is seen affecting the brainstem. No acute infarction affecting the cerebellum. I think there are a few old small vessel infarctions within the cerebellum. Cerebral hemispheres show background pattern of a few small foci of T2 and FLAIR signal in the white matter suggesting mild small vessel disease. There are scattered  punctate acute infarctions in both hemispheres, much more extensive on the left than the right, largely in a watershed distribution. These could represent a shower of micro embolic emboli or could actually relate to watershed infarction. No large confluent infarction. No  mass effect or gross hemorrhage. No hydrocephalus. No extra-axial collection. No pituitary mass. No inflammatory sinus disease. No skull or skullbase lesion. Major vessels at the base of the brain show flow. IMPRESSION: Innumerable punctate infarctions affecting both cerebral hemispheres, much more extensive on the left than the right, which could be due to a shower of micro emboli or watershed infarctions. No large confluent infarction. No mass effect or hemorrhage. Electronically Signed   By: Nelson Chimes M.D.   On: 10/17/2015 19:09     PERTINENT LAB RESULTS: CBC:  Recent Labs  10/23/15 0747 10/24/15 0402  WBC 4.5 5.0  HGB 10.7* 10.7*  HCT 33.2* 32.5*  PLT 230 204   CMET CMP     Component Value Date/Time   NA 142 10/19/2015 0250   K 3.7 10/19/2015 0250   CL 115* 10/19/2015 0250   CO2 20* 10/19/2015 0250   GLUCOSE 95 10/19/2015 0250   BUN 5* 10/19/2015 0250   CREATININE 0.85 10/19/2015 0250   CALCIUM 9.0 10/19/2015 0250   PROT 7.9 10/17/2015 1606   ALBUMIN 4.1 10/17/2015 1606   AST 40 10/17/2015 1606   ALT 27 10/17/2015 1606   ALKPHOS 82 10/17/2015 1606   BILITOT 0.5 10/17/2015 1606   GFRNONAA >60 10/19/2015 0250   GFRAA >60 10/19/2015 0250    GFR Estimated Creatinine Clearance: 62 mL/min (by C-G formula based on Cr of 0.85). No results for input(s): LIPASE, AMYLASE in the last 72 hours. No results for input(s): CKTOTAL, CKMB, CKMBINDEX, TROPONINI in the last 72 hours. Invalid input(s): POCBNP No results for input(s): DDIMER in the last 72 hours. No results for input(s): HGBA1C in the last 72 hours. No results for input(s): CHOL, HDL, LDLCALC, TRIG, CHOLHDL, LDLDIRECT in the last 72 hours. No results  for input(s): TSH, T4TOTAL, T3FREE, THYROIDAB in the last 72 hours.  Invalid input(s): FREET3 No results for input(s): VITAMINB12, FOLATE, FERRITIN, TIBC, IRON, RETICCTPCT in the last 72 hours. Coags: No results for input(s): INR in the last 72 hours.  Invalid input(s): PT Microbiology: Recent Results (from the past 240 hour(s))  Culture, blood (routine x 2)     Status: None   Collection Time: 10/17/15 10:20 PM  Result Value Ref Range Status   Specimen Description BLOOD LEFT HAND  Final   Special Requests BOTTLES DRAWN AEROBIC ONLY [place]  Final   Culture NO GROWTH 5 DAYS  Final   Report Status 10/23/2015 FINAL  Final  Culture, blood (routine x 2)     Status: None   Collection Time: 10/17/15 10:28 PM  Result Value Ref Range Status   Specimen Description BLOOD RIGHT ANTECUBITAL  Final   Special Requests BOTTLES DRAWN AEROBIC AND ANAEROBIC 5CC   Final   Culture NO GROWTH 5 DAYS  Final   Report Status 10/23/2015 FINAL  Final     BRIEF HOSPITAL COURSE:  Embolic stroke: Likely cardioembolic, 2-D echocardiogram negative for any embolic source, however a TEE showed a large myxoma.Was started on IV heparin, cardiology was consulted, underwent left heart catheterization on 1/4 which was negative. Cardiothoracic surgery was also consulted, tentatively scheduled for surgery on Jan 13. After discussing with both cardiology and neurology, transitioned to Lovenox. Patient was provided Lovenox administration education, and considered stable for discharge. Have spoken with Dr. Olam Idler office will get in touch with the patient and admitted the patient for cardiac surgery next week.  Active Problems: Hypokalemia: Repleted.  HTN (hypertension): all antihypertensives were held, and mild permissive hypertension was  allowed. She will be restarted on her usual antihypertensives on discharge. Please continue to optimize.   GERD: Continue PPI  Anxiety/depression: appears stable-continue with  BuSpar, Cymbalta, Remeron  TODAY-DAY OF DISCHARGE:  Subjective:   Rashele Nibarger today has no headache,no chest abdominal pain,no new weakness tingling or numbness, feels much better wants to go home today.  Objective:   Blood pressure 108/75, pulse 69, temperature 98.2 F (36.8 C), temperature source Oral, resp. rate 18, height 5\' 1"  (1.549 m), weight 73.301 kg (161 lb 9.6 oz), SpO2 99 %.  Intake/Output Summary (Last 24 hours) at 10/24/15 1320 Last data filed at 10/23/15 1630  Gross per 24 hour  Intake      0 ml  Output      1 ml  Net     -1 ml   Filed Weights   10/17/15 2153 10/23/15 0515  Weight: 74.254 kg (163 lb 11.2 oz) 73.301 kg (161 lb 9.6 oz)    Exam Awake Alert, Oriented *3, No new F.N deficits, Normal affect Joseph.AT,PERRAL Supple Neck,No JVD, No cervical lymphadenopathy appriciated.  Symmetrical Chest wall movement, Good air movement bilaterally, CTAB RRR,No Gallops,Rubs or new Murmurs, No Parasternal Heave +ve B.Sounds, Abd Soft, Non tender, No organomegaly appriciated, No rebound -guarding or rigidity. No Cyanosis, Clubbing or edema, No new Rash or bruise  DISCHARGE CONDITION: Stable  DISPOSITION: Home with home health services  DISCHARGE INSTRUCTIONS:    Activity:  As tolerated with Full fall precautions use walker/cane & assistance as needed  Get Medicines reviewed and adjusted: Please take all your medications with you for your next visit with your Primary MD  Please request your Primary MD to go over all hospital tests and procedure/radiological results at the follow up, please ask your Primary MD to get all Hospital records sent to his/her office.  If you experience worsening of your admission symptoms, develop shortness of breath, life threatening emergency, suicidal or homicidal thoughts you must seek medical attention immediately by calling 911 or calling your MD immediately  if symptoms less severe.  You must read complete  instructions/literature along with all the possible adverse reactions/side effects for all the Medicines you take and that have been prescribed to you. Take any new Medicines after you have completely understood and accpet all the possible adverse reactions/side effects.   Do not drive when taking Pain medications.   Do not take more than prescribed Pain, Sleep and Anxiety Medications  Special Instructions: If you have smoked or chewed Tobacco  in the last 2 yrs please stop smoking, stop any regular Alcohol  and or any Recreational drug use.  Wear Seat belts while driving.  Please note  You were cared for by a hospitalist during your hospital stay. Once you are discharged, your primary care physician will handle any further medical issues. Please note that NO REFILLS for any discharge medications will be authorized once you are discharged, as it is imperative that you return to your primary care physician (or establish a relationship with a primary care physician if you do not have one) for your aftercare needs so that they can reassess your need for medications and monitor your lab values.   Diet recommendation: Diabetic Diet Heart Healthy diet  Discharge Instructions    Ambulatory referral to Neurology    Complete by:  As directed   Pt will follow up with Dr. Erlinda Hong at [facility] in about 2 months. Thanks.     Diet - low sodium heart healthy  Complete by:  As directed      Increase activity slowly    Complete by:  As directed            Follow-up Information    Follow up with [place] In 1 week.   Contact information:   [address] [place] [id number] [phone]      Follow up with [facility] Office On 10/30/2015.   Specialty:  Cardiology   Why:  at 10:30AM for wound check    Contact information:   [address], [place] [phone]      Follow up with Xu,Jindong,  MD. Schedule an appointment as soon as possible for a visit in 2 months.   Specialty:  Neurology   Why:  stroke clinic   Contact information:   [address] [place] [address] [phone]       Follow up with [name], MD.   Specialty:  Cardiothoracic Surgery   Why:  office will call-you are scheduled for surgery on 11/01/15   Contact information:   [address] [place] [address] [phone]      Total Time spent on discharge equals  45 minutes.  SignedOren Binet 10/24/2015 1:20 PM

## 2015-10-24 NOTE — Progress Notes (Signed)
VASCULAR LAB PRELIMINARY  PRELIMINARY  PRELIMINARY  PRELIMINARY  Pre-op Cardiac Surgery  Carotid Findings: Bilateral ICA's 1-39% Stenosis.  Upper Extremity Right Left  Brachial Pressure 148 142  Radial artery Triphasic Triphasic  Ulnar Waveforms Triphasic Triphasic  Palmar Arch (Allen's Test)     Findings:  Bilateral Radial Arteries WNL. Bilateral Ulnar Arteries decreased with compression >50%.    Lower  Extremity Right Left  Dorsalis Pedis Triphasic Triphasic  Posterior Tibial Triphasic Triphasic    Findings:     Julia Schwartz, RVT, RDMS 10/24/2015, 11:43 AM

## 2015-10-24 NOTE — Progress Notes (Signed)
PT Cancellation Note  Patient Details Name: Julia Schwartz MRN: [id number] DOB: May 20, 1951   Cancelled Treatment:    Reason Eval/Treat Not Completed: Patient at procedure or test/unavailable. Pt currently off unit. Will follow up as schedule allows.    Canary Brim Ivory Broad, PT, DPT Pager #: [phone]  10/24/2015, 10:31 AM

## 2015-10-24 NOTE — Progress Notes (Signed)
Speech Language Pathology Treatment: Cognitive-Linquistic  Patient Details Name: Julia Schwartz MRN: [id number] DOB: 09-26-1951 Today's Date: 10/24/2015 Time: [id number] SLP Time Calculation (min) (ACUTE ONLY): 8 min  Assessment / Plan / Recommendation Clinical Impression  Pt for D/C home today with [place] f/u.  Demonstrates improved cognition and speech production.  Oriented x 4; improved attention; continues with persisting deficits in safety awareness, judgement, and executive functioning.  Pt will benefit from [facility] SLP f/u.    HPI HPI: Julia Schwartz is a 65 y.o. female with h/o HTN, anxiety, depression. Brought to ED as code stroke status for new onset R sided weakness, code stroke status was canceled as she had returned to baseline. No h/o prior stroke or TIA. CT scan of head was negative, MRI brain; however, shows bilateral innumerable punctate acute infarcts c/w embolic showering      SLP Plan  Discharge SLP treatment due to (comment) (D/C home today)     Recommendations      HH  SLP           Follow up Recommendations: 24 hour supervision/assistance Plan: Discharge SLP treatment due to (comment) (D/C home today)   Julia Schwartz 10/24/2015, 9:39 AM

## 2015-10-24 NOTE — Care Management Important Message (Signed)
Important Message  Patient Details  Name: Julia Schwartz MRN: [id number] Date of Birth: 06-18-1951   Medicare Important Message Given:  Yes    [name] P Shadeland 10/24/2015, 1:43 PM

## 2015-10-24 NOTE — Progress Notes (Signed)
CARDIAC REHAB PHASE I   PRE:  Rate/Rhythm: 79  BP:  Supine:   Sitting: 130/90  Standing:    SaO2: 99%RA  MODE:  Ambulation: 120 ft   POST:  Rate/Rhythm: 99 Going to vascular lab now    SaO2: 97%RA 0930-1015 Assisted to walk 120 ft with gait belt use and asst x1 with steady gait. Here for vascular lab so cut walk short. Took pt to bathroom and then assisted to wheelchair. Showed visitor how to pull up pre op video to watch prior to discharge. Pt has [facility] booklet and care guide.   Graylon Good, RN BSN  10/24/2015 10:12 AM

## 2015-10-24 NOTE — Progress Notes (Signed)
Occupational Therapy Treatment Patient Details Name: Julia Schwartz MRN: [id number] DOB: 25-Sep-1951 Today's Date: 10/24/2015    History of present illness Pt is a 65 y/o F brought to ED w/ code stroke.  Imaging revealed Bil Lt>Rt punctate infarcts.  [name] PMH includes fibromyalgia, depression, anxiety, chronic back pain.   OT comments  Pt making progress towards occupational therapy goals, but still required mod verbal cues to incorporate RUE into ADL tasks. Provided pt with a list of functional tasks and HEP to strengthen and increase ROM and fine motor coordination in RUE (pt also given theraband, red theraputty, and squeeze ball). Educated pt and son to hand/keep items on R side and to approach pt on R side to encourage R visual field scanning. Recommending Neuro Outpatient OT possibly to start after pt's cardiac surgery next week to increase functional use of RUE.   Follow Up Recommendations  Outpatient OT (Neuro) - after cardiac surgery next week   Equipment Recommendations  None recommended by OT    Recommendations for Other Services      Precautions / Restrictions Precautions Precautions: Fall       Mobility Bed Mobility Overal bed mobility: Modified Independent Bed Mobility: Supine to Sit;Sit to Supine           General bed mobility comments: HOB flat, no use of bedrails to simulate home environment.   Transfers Overall transfer level: Modified independent Equipment used: None Transfers: Sit to/from Stand Sit to Stand: Modified independent (Device/Increase time)         General transfer comment: Pt demonstrates safe transition from sit-stand. Cues for visual scanning strategies for R side    Balance Overall balance assessment: Needs assistance Sitting-balance support: No upper extremity supported;Feet supported Sitting balance-Leahy Scale: Good     Standing balance support: No upper extremity supported;During functional activity Standing balance-Leahy  Scale: Good Standing balance comment: bending and reaching for dressing tasks while standing                   ADL Overall ADL's : Needs assistance/impaired                 Upper Body Dressing : Supervision/safety;Standing   Lower Body Dressing: Supervision/safety;Sit to/from stand;Cueing for sequencing Lower Body Dressing Details (indicate cue type and reason): Cues to incorporate RUE Toilet Transfer: Supervision/safety;Ambulation;Regular Toilet   Toileting- Water quality scientist and Hygiene: Supervision/safety;Sit to/from stand   Tub/ Shower Transfer: Tub transfer;Supervision/safety;Ambulation;Shower seat   Functional mobility during ADLs: Supervision/safety;Cueing for safety General ADL Comments: Discussed visual scanning strategies (while ambulating and keeping items on R side when watching tv) with pt and instructed her have supervision when getting in/out of tub. Provided pt with list of functional activities, HEP, theraband, theraputty, and stress ball to increase strength, ROM, and fine motor coordination in RUE. Pt demonstrated all exercises and verbalized understanding - [name] also present for this training. Educated son to hand objects to pt's R hand and to approach her on the R side to encourage scanning to R side.        Vision                     Perception     Praxis      Cognition   Behavior During Therapy: [facility] for tasks assessed/performed Overall Cognitive Status: Impaired/Different from baseline Area of Impairment: Attention;Memory;Safety/judgement;Awareness;Problem solving   Current Attention Level: Selective Memory: Decreased short-term memory    Safety/Judgement: Decreased awareness of safety;Decreased awareness  of deficits Awareness: Emergent Problem Solving: Slow processing;Requires verbal cues General Comments: Pt becomes easily distracted when ambulating in hallway and required verbal cues to focus on task. Pt also has  decreased awareness of visual deficits.    Extremity/Trunk Assessment               Exercises General Exercises - Upper Extremity Shoulder Flexion: AROM;Right;10 reps;Seated Shoulder Extension: AROM;Right;10 reps;Seated Shoulder ABduction: AROM;10 reps;Seated Shoulder ADduction: AROM;Right;10 reps;Seated Shoulder Horizontal ABduction: Seated Elbow Flexion: AROM;Strengthening;Right;10 reps;Seated;Theraband Theraband Level (Elbow Flexion): Level 2 (Red) Elbow Extension: AROM;Strengthening;Right;10 reps;Seated;Theraband Theraband Level (Elbow Extension): Level 2 (Red) Hand Exercises Digit Composite Flexion: Strengthening;Right;Squeeze ball;Other (comment);Seated;10 reps (theraputty) Composite Extension: Strengthening;Right;10 reps;Seated;Squeeze ball;Other (comment) (theraputty) Digit Composite Abduction: Strengthening;Right;10 reps;Seated;Other (comment) (theraputty) Digit Composite Adduction: Strengthening;Right;10 reps;Other (comment) (theraputty) Thumb Abduction: Strengthening;Right;10 reps;Seated;Other (comment) (theraputty) Thumb Adduction: Strengthening;Right;10 reps;Seated;Other (comment) (theraputty) Opposition: Strengthening;Right;10 reps;Seated;Squeeze ball;Other (comment) (theraputty) Hand Activities Pick Up, Palm, Put Down: Right;Other reps (comment);Seated (As tolerated) Cards - Deal: Right;Seated;Other reps (comment) (As tolerated) Cards - Flip: Right;Other reps (comment);Seated (As tolerated) Cards - Hand Shuffle: Right;Other reps (comment);Seated (As tolerated) Cards - Shuffle: Right;Other reps (comment);Seated (As tolerated) Writing Skills: Right;Other reps (comment);Seated (As tolerated) Laces, Tie, Button, Snap: Right;Other reps (comment);Seated (As tolerated) Open and Close Containers: Right;Other reps (comment);Seated (As tolerated)   Shoulder Instructions       General Comments      Pertinent Vitals/ Pain       Pain Assessment: No/denies pain  Home  Living                                          Prior Functioning/Environment              Frequency Min 3X/week     Progress Toward Goals  OT Goals(current goals can now be found in the care plan section)  Progress towards OT goals: Progressing toward goals  Acute Rehab OT Goals Patient Stated Goal: to go home OT Goal Formulation: With patient Potential to Achieve Goals: Good ADL Goals Pt Will Perform Upper Body Dressing: with modified independence;sitting Pt Will Perform Lower Body Dressing: with modified independence;sit to/from stand Pt Will Transfer to Toilet: with modified independence;ambulating;regular height toilet Pt Will Perform Toileting - Clothing Manipulation and hygiene: with modified independence;sit to/from stand Pt Will Perform Tub/Shower Transfer: Tub transfer;with supervision;ambulating;shower seat Pt/caregiver will Perform Home Exercise Program: Increased ROM;Increased strength;Right Upper extremity;With theraband;With theraputty;Independently;With written HEP provided Additional ADL Goal #1: Pt will independently demonstrate visual scanning strategies with no verbal cues to increase safety with ADLs.  Plan Discharge plan needs to be updated    Co-evaluation                 End of Session Equipment Utilized During Treatment: Gait belt   Activity Tolerance Patient tolerated treatment well   Patient Left in bed;with call bell/phone within reach;with family/visitor present;with bed alarm set   Nurse Communication Mobility status;Other (comment) (Found pain pill in pt's room)        Time: [id number] OT Time Calculation (min): 33 min  Charges: OT General Charges $OT Visit: 1 Procedure OT Treatments $Self Care/Home Management : 23-37 mins  Redmond Baseman, OTR/L Pager: [phone] 10/24/2015, 2:29 PM

## 2015-10-24 NOTE — Progress Notes (Signed)
Pt for discharge home today. Discharge orders received. IVs and dcd dressing clean dry and intact to lower back. Discharge instructions and 3 prescriptions given with verbalized understanding. Lovenox instruction and stroke prevention education provided. Son at bedside to assist with discharge. Staff brought patient to lobby via wheelchair at 1425.Transported to home by family member.

## 2015-10-24 NOTE — Care Management Note (Signed)
Case Management Note  Patient Details  Name: Catlynn Grondahl MRN: [id number] Date of Birth: 1951-09-16  Subjective/Objective:                    Action/Plan: Plan is for patient to discharge home today with home health services. CM met with the patient this am. CM had left her a list of home health agencies yesterday. This am she states she would like to use [facility]. Stephanie with [facility] notified and accepted the referral. Pt also discharging with lovenox '100mg'$  daily. Benefits check done with her portion being $3 with her Medicaid backup insurance. Patient made aware. Bedside RN updated.   Expected Discharge Date:                  Expected Discharge Plan:  [place]  In-House Referral:     Discharge planning Services  CM Consult  Post Acute Care Choice:  Home Health Choice offered to:  Patient  DME Arranged:    DME Agency:     HH Arranged:  PT, OT HH Agency:  [place]  Status of Service:  Completed, signed off  Medicare Important Message Given:  Yes Date Medicare IM Given:    Medicare IM give by:    Date Additional Medicare IM Given:    Additional Medicare Important Message give by:     If discussed at [place] of Stay Meetings, dates discussed:    Additional Comments:  Pollie Friar, RN 10/24/2015, 11:56 AM

## 2015-10-25 ENCOUNTER — Encounter (HOSPITAL_COMMUNITY): Payer: Medicare Other

## 2015-10-25 DIAGNOSIS — D151 Benign neoplasm of heart: Secondary | ICD-10-CM | POA: Diagnosis not present

## 2015-10-25 DIAGNOSIS — I69351 Hemiplegia and hemiparesis following cerebral infarction affecting right dominant side: Secondary | ICD-10-CM | POA: Diagnosis not present

## 2015-10-28 ENCOUNTER — Other Ambulatory Visit: Payer: Self-pay | Admitting: *Deleted

## 2015-10-28 DIAGNOSIS — I712 Thoracic aortic aneurysm, without rupture, unspecified: Secondary | ICD-10-CM

## 2015-10-29 ENCOUNTER — Ambulatory Visit
Admission: RE | Admit: 2015-10-29 | Discharge: 2015-10-29 | Disposition: A | Discharge status: Home / Self Care | Payer: Medicare Other | Source: Ambulatory Visit | Attending: Cardiothoracic Surgery | Admitting: Cardiothoracic Surgery

## 2015-10-29 DIAGNOSIS — I712 Thoracic aortic aneurysm, without rupture, unspecified: Secondary | ICD-10-CM

## 2015-10-29 MED ORDER — IOPAMIDOL (ISOVUE-370) INJECTION 76%
75.0000 mL | Freq: Once | INTRAVENOUS | Status: AC | PRN
  Administered 2015-10-29: 75 mL via INTRAVENOUS

## 2015-10-30 ENCOUNTER — Ambulatory Visit (INDEPENDENT_AMBULATORY_CARE_PROVIDER_SITE_OTHER): Payer: Medicare Other | Admitting: *Deleted

## 2015-10-30 DIAGNOSIS — Z95818 Presence of other cardiac implants and grafts: Secondary | ICD-10-CM

## 2015-10-30 NOTE — Progress Notes (Signed)
Appt kept in error- loop recorder insertion was cancelled. I explained error to patient and apologized, she was understanding.

## 2015-10-31 ENCOUNTER — Encounter: Payer: Self-pay | Admitting: Cardiothoracic Surgery

## 2015-10-31 ENCOUNTER — Encounter (HOSPITAL_COMMUNITY): Payer: Self-pay

## 2015-10-31 ENCOUNTER — Ambulatory Visit (HOSPITAL_COMMUNITY)
Admission: RE | Admit: 2015-10-31 | Discharge: 2015-10-31 | Disposition: A | Discharge status: Home / Self Care | Payer: Medicare Other | Source: Ambulatory Visit | Attending: Cardiothoracic Surgery | Admitting: Cardiothoracic Surgery

## 2015-10-31 ENCOUNTER — Ambulatory Visit (INDEPENDENT_AMBULATORY_CARE_PROVIDER_SITE_OTHER): Payer: Medicare Other | Admitting: Cardiothoracic Surgery

## 2015-10-31 ENCOUNTER — Encounter (HOSPITAL_COMMUNITY)
Admit: 2015-10-31 | Discharge: 2015-10-31 | Disposition: A | Discharge status: Home / Self Care | Payer: Medicare Other | Attending: Cardiothoracic Surgery | Admitting: Cardiothoracic Surgery

## 2015-10-31 VITALS — BP 112/72 | HR 102 | Temp 98.9°F | Resp 18 | Ht 61.0 in | Wt 160.4 lb

## 2015-10-31 DIAGNOSIS — D151 Benign neoplasm of heart: Secondary | ICD-10-CM | POA: Insufficient documentation

## 2015-10-31 DIAGNOSIS — Z01812 Encounter for preprocedural laboratory examination: Secondary | ICD-10-CM | POA: Insufficient documentation

## 2015-10-31 DIAGNOSIS — Z0183 Encounter for blood typing: Secondary | ICD-10-CM | POA: Diagnosis not present

## 2015-10-31 DIAGNOSIS — Z01818 Encounter for other preprocedural examination: Secondary | ICD-10-CM | POA: Diagnosis present

## 2015-10-31 DIAGNOSIS — R9431 Abnormal electrocardiogram [ECG] [EKG]: Secondary | ICD-10-CM | POA: Insufficient documentation

## 2015-10-31 HISTORY — DX: Cerebral infarction, unspecified: I63.9

## 2015-10-31 HISTORY — DX: Inflammatory liver disease, unspecified: K75.9

## 2015-10-31 LAB — COMPREHENSIVE METABOLIC PANEL
ALT: 34 U/L (ref 14–54)
AST: 43 U/L — ABNORMAL HIGH (ref 15–41)
Albumin: 4.2 g/dL (ref 3.5–5.0)
Alkaline Phosphatase: 61 U/L (ref 38–126)
Anion gap: 14 (ref 5–15)
BUN: 6 mg/dL (ref 6–20)
CO2: 22 mmol/L (ref 22–32)
Calcium: 9.5 mg/dL (ref 8.9–10.3)
Chloride: 103 mmol/L (ref 101–111)
Creatinine, Ser: 0.92 mg/dL (ref 0.44–1.00)
GFR calc Af Amer: >60 mL/min (ref >60)
GFR calc non Af Amer: >60 mL/min (ref >60)
Glucose, Bld: 117 mg/dL — ABNORMAL HIGH (ref 65–99)
Potassium: 3.2 mmol/L — ABNORMAL LOW (ref 3.5–5.1)
Sodium: 139 mmol/L (ref 135–145)
Total Bilirubin: 0.8 mg/dL (ref 0.3–1.2)
Total Protein: 7.5 g/dL (ref 6.5–8.1)

## 2015-10-31 LAB — BLOOD GAS, ARTERIAL
Acid-Base Excess: 1 mmol/L (ref 0.0–2.0)
Bicarbonate: 24.9 mEq/L — ABNORMAL HIGH (ref 20.0–24.0)
Drawn by: 206361
FIO2: 0.21
O2 Saturation: 94.9 %
Patient temperature: 98.6
TCO2: 26.1 mmol/L (ref 0–100)
pCO2 arterial: 38.4 mmHg (ref 35.0–45.0)
pH, Arterial: 7.427 (ref 7.350–7.450)
pO2, Arterial: 75.7 mmHg — ABNORMAL LOW (ref 80.0–100.0)

## 2015-10-31 LAB — CBC
HCT: 39.1 % (ref 36.0–46.0)
Hemoglobin: 12.6 g/dL (ref 12.0–15.0)
MCH: 29.1 pg (ref 26.0–34.0)
MCHC: 32.2 g/dL (ref 30.0–36.0)
MCV: 90.3 fL (ref 78.0–100.0)
Platelets: 309 10*3/uL (ref 150–400)
RBC: 4.33 MIL/uL (ref 3.87–5.11)
RDW: 13.7 % (ref 11.5–15.5)
WBC: 4.9 10*3/uL (ref 4.0–10.5)

## 2015-10-31 LAB — PULMONARY FUNCTION TEST
DL/VA % pred: 85 %
DL/VA: 3.78 ml/min/mmHg/L
DLCO unc % pred: 81 %
DLCO unc: 16.39 ml/min/mmHg
FEF 25-75 Pre: 1.55 L/sec
FEF2575-%Pred-Pre: 89 %
FEV1-%Pred-Pre: 127 %
FEV1-Pre: 2.22 L
FEV1FVC-%Pred-Pre: 82 %
FEV6-%Pred-Pre: 158 %
FEV6-Pre: 3.39 L
FEV6FVC-%Pred-Pre: 104 %
FVC-%Pred-Pre: 151 %
FVC-Pre: 3.39 L
Pre FEV1/FVC ratio: 65 %
Pre FEV6/FVC Ratio: 100 %
RV % pred: 114 %
RV: 2.18 L
TLC % pred: 113 %
TLC: 5.2 L

## 2015-10-31 LAB — ABO/RH: ABO/RH(D): O POS

## 2015-10-31 LAB — PROTIME-INR
INR: 1.03 (ref 0.00–1.49)
Prothrombin Time: 13.7 seconds (ref 11.6–15.2)

## 2015-10-31 LAB — URINALYSIS, ROUTINE W REFLEX MICROSCOPIC
Bilirubin Urine: NEGATIVE
Glucose, UA: NEGATIVE mg/dL
Hgb urine dipstick: NEGATIVE
Ketones, ur: NEGATIVE mg/dL
Leukocytes, UA: NEGATIVE
Nitrite: NEGATIVE
Protein, ur: NEGATIVE mg/dL
Specific Gravity, Urine: 1.007 (ref 1.005–1.030)
pH: 5.5 (ref 5.0–8.0)

## 2015-10-31 LAB — SURGICAL PCR SCREEN
MRSA, PCR: POSITIVE — AB
Staphylococcus aureus: POSITIVE — AB

## 2015-10-31 LAB — APTT: aPTT: 42 seconds — ABNORMAL HIGH (ref 24–37)

## 2015-10-31 NOTE — Progress Notes (Signed)
PCP is [name], MD Referring Provider is Belva Crome, MD  Chief Complaint  Patient presents with  . Follow-up    further discuss surgery scheduled for 11/04/15, resection of left atrial myxoma     [id number] presents for discussion of upcoming surgery for resection of a left atrial myxoma which was diagnosed earlier this month month when the patient presented to the ED with acute right-sided weakness and some speech problems.  I have personally reviewed the patient's echocardiogram showing the left atrial myxoma, the coronary angiograms, and the chest CT scan which shows the ascending aorta measured at 3.8 cm without pulmonary parenchymal abnormalities.  The patient was discharged to home on Lovenox 100 mg daily injection. She is in a sinus rhythm. Her neurologic deficit has improved slowly. She is able to ambulate without a walker or cane, hallway in the office today. She has 3/5 strength in her right upper extremity-dominant side. She denies any symptoms of heart failure or chest pain. Unfortunately she started smoking again when she went home 5-10 cigarettes a day.  Patient had pre-cardiac surgery carotid Dopplers and peripheral Dopplers which were normal. PFTs performed today with room-air ABG are reviewed by myself and are satisfactory.  The patient has had no recent cough fever or evidence of viral upper respiratory infection   Past Medical History  Diagnosis Date  . Hypertension   . Anxiety   . Depression   . Arthritis   . GERD (gastroesophageal reflux disease)   . Chronic back pain   . Wears dentures     top-partial bottom  . Wears glasses   . Fibromyalgia   . Stroke [facility]     TIA came to er on 1/3  some deficits in right hand  . Hepatitis     hep  c    dx 2007    Past Surgical History  Procedure Laterality Date  . Submandibular gland excision      left  . Abdominal hysterectomy    . Tonsillectomy    . Bunionectomy      both feet  . Fracture surgery       fx lt ankle  . Tee without cardioversion N/A 10/18/2015    Procedure: TRANSESOPHAGEAL ECHOCARDIOGRAM (TEE);  Surgeon: Jaquesha Spark, MD;  Location: [facility] ENDOSCOPY;  Service: Cardiovascular;  Laterality: N/A;  . Cardiac catheterization N/A 10/23/2015    Procedure: Left Heart Cath and Coronary Angiography;  Surgeon: Belva Crome, MD;  Location: [facility] CV LAB;  Service: Cardiovascular;  Laterality: N/A;    No family history on file.  Social History Social History  Substance Use Topics  . Smoking status: Current Every Day Smoker -- 0.25 packs/day for 10 years    Types: Cigarettes  . Smokeless tobacco: None  . Alcohol Use: No    Current Outpatient Prescriptions  Medication Sig Dispense Refill  . amLODipine-benazepril (LOTREL) 10-40 MG per capsule Take 1 capsule by mouth daily.    Marland Kitchen atorvastatin (LIPITOR) 20 MG tablet Take 1 tablet (20 mg total) by mouth daily at 6 PM. 30 tablet 0  . busPIRone (BUSPAR) 15 MG tablet Take 15 mg by mouth 3 (three) times daily.     . DULoxetine (CYMBALTA) 60 MG capsule Take 60 mg by mouth daily.    Marland Kitchen enoxaparin (LOVENOX) 100 MG/ML injection Inject 1 mL (100 mg total) into the skin daily. 8 Syringe 0  . furosemide (LASIX) 20 MG tablet Take 20 mg by mouth daily.    Marland Kitchen  HYDROcodone-acetaminophen (NORCO) 10-325 MG per tablet Take 1 tablet by mouth 3 (three) times daily.     . mirtazapine (REMERON) 15 MG tablet Take 15 mg by mouth at bedtime.    Marland Kitchen omeprazole (PRILOSEC) 40 MG capsule Take 1 capsule (40 mg total) by mouth daily. 30 capsule 0  . pregabalin (LYRICA) 75 MG capsule Take 75-150 mg by mouth 2 (two) times daily. 150mg  in the morning and 75mg  in the evening    . tiZANidine (ZANAFLEX) 4 MG capsule Take 4 mg by mouth 3 (three) times daily as needed for muscle spasms.     Marland Kitchen zolpidem (AMBIEN) 10 MG tablet Take 10 mg by mouth at bedtime.      No current facility-administered medications for this visit.    Allergies  Allergen Reactions  . Ultram  [Tramadol] Hives    Review of Systems         Review of Systems :  [ y ] = yes, [  ] = no        General :  Weight gain [   ]    Weight loss  [   ]  Fatigue [  yes]  Fever [  ]  Chills  [  ]                                Weakness  [  ]           Cardiac :  Chest pain/ pressure [  ]  Resting SOB [  ] exertional SOB [  ]                        Orthopnea [  ]  Pedal edema  [  ]  Palpitations [  ] Syncope/presyncope [ ]                         Paroxysmal nocturnal dyspnea [  ]        Pulmonary : cough [  ]  wheezing [  ]  Hemoptysis [  ] Sputum [  ] Snoring [  ]                              Pneumothorax [  ]  Sleep apnea [  ]       GI : Vomiting [  ]  Dysphagia [  ]  Melena  [  ]  Abdominal pain [  ] BRBPR [  ]              Heart burn [  ]  Constipation [  ] Diarrhea  [  ] Colonoscopy [  ]       GU : Hematuria [  ]  Dysuria [  ]  Nocturia [  ] UTI's [  ]       Vascular : Claudication [  ]  Rest pain [  ]  DVT [  ] Vein stripping [  ] leg ulcers [  ]                          TIA [  ] Stroke [yes-right side improving  ]  Varicose veins [  ]       NEURO :  Headaches  [  ]  Seizures [  ] Vision changes [  ] Paresthesias [  ]       Musculoskeletal :  Arthritis [  ] Gout  [  ]  Back pain [  ]  Joint pain [  ]       Skin :  Rash [  ]  Melanoma [  ]        Heme : Bleeding problems [  ]Clotting Disorders [  ] Anemia [  ]Blood Transfusion [ ]        Endocrine : Diabetes [  ] Thyroid Disorder  [  ]       Psych : Depression [  yes]  Anxiety [ yes ]  Psych hospitalizations [  ]patient on chronic psychotropic medication                                               BP 103/75 mmHg  Pulse 100  Resp 20  Ht 5\' 1"  (1.549 m)  Wt 160 lb (72.576 kg)  BMI 30.25 kg/m2  SpO2 97% Physical Exam      Physical Exam  General: pleasant middle-aged  AA female no acute distress HEENT: Normocephalic pupils equal , dentition adequate Neck: Supple without JVD, adenopathy, or bruit Chest: Clear to auscultation,  symmetrical breath sounds, no rhonchi, no tenderness             or deformity Cardiovascular: Regular rate and rhythm, no murmur, no gallop, peripheral pulses             palpable in all extremities Abdomen:  Soft, nontender, no palpable mass or organomegaly Extremities: Warm, well-perfused, no clubbing cyanosis edema or tenderness,              no venous stasis changes of the legs Rectal/GU: Deferred Neuro:right upper extremity weakness 3/5, patient able to ambulate down the office hallway today without difficulty Skin: Clean and dry without rash or ulceration   Diagnostic Tests: Echocardiogram, coronary angiogram, CT scan of chest, all personally reviewed. She has  a 3 cm left atrial myxoma to be resected on Monday, January 16  Impression: Atrial myxoma Right body sided stroke  Plan: Stop Lovenox 48 hours prior to surgery--last dose p.m. January 13 Report for surgery January 16 5:30 AM-no smoking prior to surgery. Patient understands the risks of this operation include worsening of her stroke, bleeding requiring blood transfusion, postoperative infection, and death. She agrees to proceed.  [name], MD [facility] [phone]

## 2015-10-31 NOTE — Progress Notes (Addendum)
PCP IS Dr. York Ram  - [place]. She has also back issues going on and takes norco.   Cardio  Is Dr. Linard Millers

## 2015-10-31 NOTE — Pre-Procedure Instructions (Signed)
Julia Schwartz  10/31/2015      [facility] [address] - [place], [place] [address] [place] [address] Phone: [phone] Fax: [phone]    Your procedure is scheduled on Jan. 16th Monday   Report to [facility] Admitting at 5:30 AM               (Surgery time is 7:30 am - 11:10                                                                                               Call this number if you have problems the morning of surgery:  [phone]   Remember:  Do not eat food or drink liquids after midnight Sunday.  Take these medicines the morning of surgery with A SIP OF WATER : Prilosec.   Do not wear jewelry, make-up or nail polish.  Do not wear lotions, powders, or perfumes.  You may NOT wear deodorant the day of surgery.  Do not shave underarms & legs 48 hours prior to surgery.     Do not bring valuables to the hospital.  [facility] is not responsible for any belongings or valuables.  Contacts, dentures or bridgework may not be worn into surgery.  Leave your suitcase in the car.  After surgery it may be brought to your room. For patients admitted to the hospital, discharge time will be determined by your treatment team.   Name and phone number of your driver:  :  [name]    g-[id number]     Please read over the following fact sheets that you were given. Pain Booklet, Coughing and Deep Breathing, Blood Transfusion Information, MRSA Information and Surgical Site Infection Prevention

## 2015-11-01 LAB — HEMOGLOBIN A1C
Hgb A1c MFr Bld: 5.9 % — ABNORMAL HIGH (ref 4.8–5.6)
Mean Plasma Glucose: 123 mg/dL

## 2015-11-01 MED ORDER — POTASSIUM CHLORIDE 2 MEQ/ML IV SOLN
80.0000 meq | INTRAVENOUS | Status: DC
  Filled 2015-11-01: qty 40

## 2015-11-01 MED ORDER — CHLORHEXIDINE GLUCONATE 0.12 % MT SOLN
15.0000 mL | Freq: Once | OROMUCOSAL | Status: DC
  Filled 2015-11-01: qty 15

## 2015-11-01 MED ORDER — SODIUM CHLORIDE 0.9 % IV SOLN
INTRAVENOUS | Status: DC
  Filled 2015-11-01: qty 30

## 2015-11-01 MED ORDER — NITROGLYCERIN IN D5W 200-5 MCG/ML-% IV SOLN
2.0000 ug/min | INTRAVENOUS | Status: DC
  Filled 2015-11-01: qty 250

## 2015-11-01 MED ORDER — SODIUM CHLORIDE 0.9 % IV SOLN
INTRAVENOUS | Status: AC
  Administered 2015-11-04: 1.1 [IU]/h via INTRAVENOUS
  Filled 2015-11-01: qty 2.5

## 2015-11-01 MED ORDER — SODIUM CHLORIDE 0.9 % IV SOLN
INTRAVENOUS | Status: AC
  Administered 2015-11-04: 69.8 mL/h via INTRAVENOUS
  Filled 2015-11-01: qty 40

## 2015-11-01 MED ORDER — DEXTROSE 5 % IV SOLN
1.5000 g | INTRAVENOUS | Status: AC
  Administered 2015-11-04: 1.5 g via INTRAVENOUS
  Administered 2015-11-04: .75 g via INTRAVENOUS
  Filled 2015-11-01: qty 1.5

## 2015-11-01 MED ORDER — MAGNESIUM SULFATE 50 % IJ SOLN
40.0000 meq | INTRAMUSCULAR | Status: DC
  Filled 2015-11-01: qty 10

## 2015-11-01 MED ORDER — EPINEPHRINE HCL 1 MG/ML IJ SOLN
0.0000 ug/min | INTRAMUSCULAR | Status: DC
Start: 2015-11-04 — End: 2015-11-04
  Filled 2015-11-01: qty 4

## 2015-11-01 MED ORDER — VANCOMYCIN HCL 10 G IV SOLR
1250.0000 mg | INTRAVENOUS | Status: AC
  Administered 2015-11-04: 1250 mg via INTRAVENOUS
  Filled 2015-11-01: qty 1250

## 2015-11-01 MED ORDER — DEXTROSE 5 % IV SOLN
750.0000 mg | INTRAVENOUS | Status: DC
  Filled 2015-11-01: qty 750

## 2015-11-01 MED ORDER — PLASMA-LYTE 148 IV SOLN
INTRAVENOUS | Status: AC
  Administered 2015-11-04: 500 mL
  Filled 2015-11-01: qty 2.5

## 2015-11-01 MED ORDER — GLUTARALDEHYDE 0.625% SOAKING SOLUTION
TOPICAL | Status: AC
  Administered 2015-11-04: 1 via TOPICAL
  Filled 2015-11-01: qty 50

## 2015-11-01 MED ORDER — DEXMEDETOMIDINE HCL IN NACL 400 MCG/100ML IV SOLN
0.1000 ug/kg/h | INTRAVENOUS | Status: AC
  Administered 2015-11-04: .4 ug/kg/h via INTRAVENOUS
  Filled 2015-11-01: qty 100

## 2015-11-01 MED ORDER — METOPROLOL TARTRATE 12.5 MG HALF TABLET
12.5000 mg | ORAL_TABLET | Freq: Once | ORAL | Status: AC
  Administered 2015-11-04: 12.5 mg via ORAL
  Filled 2015-11-01: qty 1

## 2015-11-01 MED ORDER — DEXTROSE 5 % IV SOLN
30.0000 ug/min | INTRAVENOUS | Status: AC
Start: 2015-11-04 — End: 2015-11-04
  Administered 2015-11-04: 50 ug/min via INTRAVENOUS
  Filled 2015-11-01: qty 2

## 2015-11-01 MED ORDER — DOPAMINE-DEXTROSE 3.2-5 MG/ML-% IV SOLN
0.0000 ug/kg/min | INTRAVENOUS | Status: DC
Start: 2015-11-04 — End: 2015-11-04
  Filled 2015-11-01: qty 250

## 2015-11-01 MED ORDER — CHLORHEXIDINE GLUCONATE 4 % EX LIQD: 30.0000 mL | CUTANEOUS | Status: DC

## 2015-11-03 NOTE — Anesthesia Preprocedure Evaluation (Addendum)
Anesthesia Evaluation  Patient identified by MRN, date of birth, ID band Patient awake    History of Anesthesia Complications Negative for: history of anesthetic complications  Airway Mallampati: II  TM Distance: >3 FB Neck ROM: Full    Dental  (+) Edentulous Upper, Edentulous Lower   Pulmonary Current Smoker,    breath sounds clear to auscultation       Cardiovascular hypertension, + Peripheral Vascular Disease   Rhythm:Regular Rate:Normal  Atrial myxoma, preserved LV function on ECHO   Neuro/Psych Embolic CVA  Neuromuscular disease CVA    GI/Hepatic GERD  ,(+) Hepatitis -  Endo/Other    Renal/GU      Musculoskeletal  (+) Arthritis , Fibromyalgia -  Abdominal   Peds  Hematology   Anesthesia Other Findings   Reproductive/Obstetrics                            Anesthesia Physical Anesthesia Plan  ASA: III  Anesthesia Plan: General   Post-op Pain Management:    Induction: Intravenous  Airway Management Planned: Oral ETT  Additional Equipment: PA Cath, TEE, Arterial line and Ultrasound Guidance Line Placement  Intra-op Plan:   Post-operative Plan: Post-operative intubation/ventilation  Informed Consent: I have reviewed the patients History and Physical, chart, labs and discussed the procedure including the risks, benefits and alternatives for the proposed anesthesia with the patient or authorized representative who has indicated his/her understanding and acceptance.   Dental advisory given  Plan Discussed with: CRNA and Surgeon  Anesthesia Plan Comments:         Anesthesia Quick Evaluation

## 2015-11-04 ENCOUNTER — Encounter (HOSPITAL_COMMUNITY)
Admission: RE | Disposition: A | Discharge status: Home Health | Payer: Medicare Other | Source: Ambulatory Visit | Attending: Cardiothoracic Surgery

## 2015-11-04 ENCOUNTER — Inpatient Hospital Stay (HOSPITAL_COMMUNITY)
Admission: RE | Admit: 2015-11-04 | Discharge: 2015-11-15 | DRG: 229 | Disposition: A | Discharge status: Home Health | Payer: Medicare Other | Source: Ambulatory Visit | Attending: Cardiothoracic Surgery | Admitting: Cardiothoracic Surgery

## 2015-11-04 ENCOUNTER — Inpatient Hospital Stay (HOSPITAL_COMMUNITY): Payer: Medicare Other

## 2015-11-04 ENCOUNTER — Inpatient Hospital Stay (HOSPITAL_COMMUNITY): Payer: Medicare Other | Admitting: Certified Registered"

## 2015-11-04 ENCOUNTER — Encounter (HOSPITAL_COMMUNITY): Payer: Self-pay | Admitting: *Deleted

## 2015-11-04 DIAGNOSIS — Z7901 Long term (current) use of anticoagulants: Secondary | ICD-10-CM

## 2015-11-04 DIAGNOSIS — E119 Type 2 diabetes mellitus without complications: Secondary | ICD-10-CM | POA: Diagnosis present

## 2015-11-04 DIAGNOSIS — M797 Fibromyalgia: Secondary | ICD-10-CM | POA: Diagnosis present

## 2015-11-04 DIAGNOSIS — E876 Hypokalemia: Secondary | ICD-10-CM | POA: Diagnosis not present

## 2015-11-04 DIAGNOSIS — I119 Hypertensive heart disease without heart failure: Secondary | ICD-10-CM | POA: Diagnosis present

## 2015-11-04 DIAGNOSIS — I69351 Hemiplegia and hemiparesis following cerebral infarction affecting right dominant side: Secondary | ICD-10-CM | POA: Diagnosis not present

## 2015-11-04 DIAGNOSIS — D219 Benign neoplasm of connective and other soft tissue, unspecified: Secondary | ICD-10-CM

## 2015-11-04 DIAGNOSIS — I10 Essential (primary) hypertension: Secondary | ICD-10-CM | POA: Diagnosis present

## 2015-11-04 DIAGNOSIS — I739 Peripheral vascular disease, unspecified: Secondary | ICD-10-CM | POA: Diagnosis present

## 2015-11-04 DIAGNOSIS — I495 Sick sinus syndrome: Secondary | ICD-10-CM | POA: Diagnosis present

## 2015-11-04 DIAGNOSIS — D62 Acute posthemorrhagic anemia: Secondary | ICD-10-CM | POA: Diagnosis not present

## 2015-11-04 DIAGNOSIS — K219 Gastro-esophageal reflux disease without esophagitis: Secondary | ICD-10-CM | POA: Diagnosis present

## 2015-11-04 DIAGNOSIS — D151 Benign neoplasm of heart: Principal | ICD-10-CM | POA: Diagnosis present

## 2015-11-04 DIAGNOSIS — R0602 Shortness of breath: Secondary | ICD-10-CM

## 2015-11-04 DIAGNOSIS — I480 Paroxysmal atrial fibrillation: Secondary | ICD-10-CM | POA: Diagnosis not present

## 2015-11-04 DIAGNOSIS — Z886 Allergy status to analgesic agent status: Secondary | ICD-10-CM

## 2015-11-04 DIAGNOSIS — E8770 Fluid overload, unspecified: Secondary | ICD-10-CM | POA: Diagnosis not present

## 2015-11-04 DIAGNOSIS — R55 Syncope and collapse: Secondary | ICD-10-CM | POA: Diagnosis not present

## 2015-11-04 DIAGNOSIS — I459 Conduction disorder, unspecified: Secondary | ICD-10-CM | POA: Diagnosis not present

## 2015-11-04 DIAGNOSIS — Z959 Presence of cardiac and vascular implant and graft, unspecified: Secondary | ICD-10-CM

## 2015-11-04 DIAGNOSIS — F17210 Nicotine dependence, cigarettes, uncomplicated: Secondary | ICD-10-CM | POA: Diagnosis present

## 2015-11-04 DIAGNOSIS — D696 Thrombocytopenia, unspecified: Secondary | ICD-10-CM | POA: Diagnosis present

## 2015-11-04 DIAGNOSIS — E785 Hyperlipidemia, unspecified: Secondary | ICD-10-CM | POA: Diagnosis present

## 2015-11-04 HISTORY — PX: TEE WITHOUT CARDIOVERSION: SHX5443

## 2015-11-04 HISTORY — PX: EXCISION OF ATRIAL MYXOMA: SHX5821

## 2015-11-04 LAB — POCT I-STAT, CHEM 8
BUN: 10 mg/dL (ref 6–20)
BUN: 8 mg/dL (ref 6–20)
BUN: 9 mg/dL (ref 6–20)
BUN: 8 mg/dL (ref 6–20)
BUN: 7 mg/dL (ref 6–20)
BUN: 6 mg/dL (ref 6–20)
Calcium, Ion: 1.18 mmol/L (ref 1.13–1.30)
Calcium, Ion: 0.86 mmol/L — ABNORMAL LOW (ref 1.13–1.30)
Calcium, Ion: 1.15 mmol/L (ref 1.13–1.30)
Calcium, Ion: 0.88 mmol/L — ABNORMAL LOW (ref 1.13–1.30)
Calcium, Ion: 1.1 mmol/L — ABNORMAL LOW (ref 1.13–1.30)
Calcium, Ion: 1.17 mmol/L (ref 1.13–1.30)
Chloride: 101 mmol/L (ref 101–111)
Chloride: 103 mmol/L (ref 101–111)
Chloride: 98 mmol/L — ABNORMAL LOW (ref 101–111)
Chloride: 101 mmol/L (ref 101–111)
Chloride: 100 mmol/L — ABNORMAL LOW (ref 101–111)
Chloride: 107 mmol/L (ref 101–111)
Creatinine, Ser: 0.6 mg/dL (ref 0.44–1.00)
Creatinine, Ser: 0.4 mg/dL — ABNORMAL LOW (ref 0.44–1.00)
Creatinine, Ser: 0.7 mg/dL (ref 0.44–1.00)
Creatinine, Ser: 0.5 mg/dL (ref 0.44–1.00)
Creatinine, Ser: 0.6 mg/dL (ref 0.44–1.00)
Creatinine, Ser: 0.7 mg/dL (ref 0.44–1.00)
Glucose, Bld: 116 mg/dL — ABNORMAL HIGH (ref 65–99)
Glucose, Bld: 220 mg/dL — ABNORMAL HIGH (ref 65–99)
Glucose, Bld: 226 mg/dL — ABNORMAL HIGH (ref 65–99)
Glucose, Bld: 207 mg/dL — ABNORMAL HIGH (ref 65–99)
Glucose, Bld: 191 mg/dL — ABNORMAL HIGH (ref 65–99)
Glucose, Bld: 132 mg/dL — ABNORMAL HIGH (ref 65–99)
HCT: 36 % (ref 36.0–46.0)
HCT: 26 % — ABNORMAL LOW (ref 36.0–46.0)
HCT: 40 % (ref 36.0–46.0)
HCT: 24 % — ABNORMAL LOW (ref 36.0–46.0)
HCT: 27 % — ABNORMAL LOW (ref 36.0–46.0)
HCT: 30 % — ABNORMAL LOW (ref 36.0–46.0)
Hemoglobin: 12.2 g/dL (ref 12.0–15.0)
Hemoglobin: 13.6 g/dL (ref 12.0–15.0)
Hemoglobin: 8.8 g/dL — ABNORMAL LOW (ref 12.0–15.0)
Hemoglobin: 8.2 g/dL — ABNORMAL LOW (ref 12.0–15.0)
Hemoglobin: 9.2 g/dL — ABNORMAL LOW (ref 12.0–15.0)
Hemoglobin: 10.2 g/dL — ABNORMAL LOW (ref 12.0–15.0)
Potassium: 3 mmol/L — ABNORMAL LOW (ref 3.5–5.1)
Potassium: 2.6 mmol/L — CL (ref 3.5–5.1)
Potassium: 2.8 mmol/L — ABNORMAL LOW (ref 3.5–5.1)
Potassium: 2.7 mmol/L — CL (ref 3.5–5.1)
Potassium: 2.8 mmol/L — ABNORMAL LOW (ref 3.5–5.1)
Potassium: 3.7 mmol/L (ref 3.5–5.1)
Sodium: 144 mmol/L (ref 135–145)
Sodium: 140 mmol/L (ref 135–145)
Sodium: 141 mmol/L (ref 135–145)
Sodium: 140 mmol/L (ref 135–145)
Sodium: 142 mmol/L (ref 135–145)
Sodium: 139 mmol/L (ref 135–145)
TCO2: 29 mmol/L (ref 0–100)
TCO2: 26 mmol/L (ref 0–100)
TCO2: 28 mmol/L (ref 0–100)
TCO2: 30 mmol/L (ref 0–100)
TCO2: 25 mmol/L (ref 0–100)
TCO2: 21 mmol/L (ref 0–100)

## 2015-11-04 LAB — POCT I-STAT 3, ART BLOOD GAS (G3+)
Acid-Base Excess: 4 mmol/L — ABNORMAL HIGH (ref 0.0–2.0)
Acid-Base Excess: 1 mmol/L (ref 0.0–2.0)
Acid-base deficit: 1 mmol/L (ref 0.0–2.0)
Acid-base deficit: 3 mmol/L — ABNORMAL HIGH (ref 0.0–2.0)
Acid-base deficit: 3 mmol/L — ABNORMAL HIGH (ref 0.0–2.0)
Acid-base deficit: 4 mmol/L — ABNORMAL HIGH (ref 0.0–2.0)
Bicarbonate: 29.7 mEq/L — ABNORMAL HIGH (ref 20.0–24.0)
Bicarbonate: 25.3 mEq/L — ABNORMAL HIGH (ref 20.0–24.0)
Bicarbonate: 24.5 mEq/L — ABNORMAL HIGH (ref 20.0–24.0)
Bicarbonate: 23.1 mEq/L (ref 20.0–24.0)
Bicarbonate: 21.6 mEq/L (ref 20.0–24.0)
Bicarbonate: 21 mEq/L (ref 20.0–24.0)
O2 Saturation: 100 %
O2 Saturation: 100 %
O2 Saturation: 100 %
O2 Saturation: 95 %
O2 Saturation: 98 %
O2 Saturation: 97 %
Patient temperature: 36.7
Patient temperature: 36.4
Patient temperature: 36.5
TCO2: 31 mmol/L (ref 0–100)
TCO2: 26 mmol/L (ref 0–100)
TCO2: 26 mmol/L (ref 0–100)
TCO2: 24 mmol/L (ref 0–100)
TCO2: 23 mmol/L (ref 0–100)
TCO2: 22 mmol/L (ref 0–100)
pCO2 arterial: 50.5 mmHg — ABNORMAL HIGH (ref 35.0–45.0)
pCO2 arterial: 36.7 mmHg (ref 35.0–45.0)
pCO2 arterial: 43.1 mmHg (ref 35.0–45.0)
pCO2 arterial: 44.8 mmHg (ref 35.0–45.0)
pCO2 arterial: 34.8 mmHg — ABNORMAL LOW (ref 35.0–45.0)
pCO2 arterial: 34.1 mmHg — ABNORMAL LOW (ref 35.0–45.0)
pH, Arterial: 7.377 (ref 7.350–7.450)
pH, Arterial: 7.445 (ref 7.350–7.450)
pH, Arterial: 7.362 (ref 7.350–7.450)
pH, Arterial: 7.318 — ABNORMAL LOW (ref 7.350–7.450)
pH, Arterial: 7.397 (ref 7.350–7.450)
pH, Arterial: 7.395 (ref 7.350–7.450)
pO2, Arterial: 381 mmHg — ABNORMAL HIGH (ref 80.0–100.0)
pO2, Arterial: 405 mmHg — ABNORMAL HIGH (ref 80.0–100.0)
pO2, Arterial: 238 mmHg — ABNORMAL HIGH (ref 80.0–100.0)
pO2, Arterial: 83 mmHg (ref 80.0–100.0)
pO2, Arterial: 94 mmHg (ref 80.0–100.0)
pO2, Arterial: 86 mmHg (ref 80.0–100.0)

## 2015-11-04 LAB — CBC
HCT: 24.8 % — ABNORMAL LOW (ref 36.0–46.0)
HEMATOCRIT: 28.5 % — AB (ref 36.0–46.0)
HEMOGLOBIN: 9.4 g/dL — AB (ref 12.0–15.0)
Hemoglobin: 7.9 g/dL — ABNORMAL LOW (ref 12.0–15.0)
MCH: 29.7 pg (ref 26.0–34.0)
MCH: 28.7 pg (ref 26.0–34.0)
MCHC: 33 g/dL (ref 30.0–36.0)
MCHC: 31.9 g/dL (ref 30.0–36.0)
MCV: 90.2 fL (ref 78.0–100.0)
MCV: 90.2 fL (ref 78.0–100.0)
Platelets: 142 10*3/uL — ABNORMAL LOW (ref 150–400)
Platelets: 129 10*3/uL — ABNORMAL LOW (ref 150–400)
RBC: 3.16 MIL/uL — ABNORMAL LOW (ref 3.87–5.11)
RBC: 2.75 MIL/uL — ABNORMAL LOW (ref 3.87–5.11)
RDW: 13.3 % (ref 11.5–15.5)
RDW: 13.4 % (ref 11.5–15.5)
WBC: 10.9 10*3/uL — ABNORMAL HIGH (ref 4.0–10.5)
WBC: 7.9 10*3/uL (ref 4.0–10.5)

## 2015-11-04 LAB — PROTIME-INR
INR: 1.61 — ABNORMAL HIGH (ref 0.00–1.49)
Prothrombin Time: 19.1 seconds — ABNORMAL HIGH (ref 11.6–15.2)

## 2015-11-04 LAB — APTT: aPTT: 43 seconds — ABNORMAL HIGH (ref 24–37)

## 2015-11-04 LAB — HEMOGLOBIN AND HEMATOCRIT, BLOOD
HCT: 23.3 % — ABNORMAL LOW (ref 36.0–46.0)
Hemoglobin: 7.5 g/dL — ABNORMAL LOW (ref 12.0–15.0)

## 2015-11-04 LAB — GLUCOSE, CAPILLARY: Glucose-Capillary: 96 mg/dL (ref 65–99)

## 2015-11-04 LAB — POCT I-STAT 4, (NA,K, GLUC, HGB,HCT)
Glucose, Bld: 114 mg/dL — ABNORMAL HIGH (ref 65–99)
HCT: 30 % — ABNORMAL LOW (ref 36.0–46.0)
Hemoglobin: 10.2 g/dL — ABNORMAL LOW (ref 12.0–15.0)
Potassium: 3.2 mmol/L — ABNORMAL LOW (ref 3.5–5.1)
Sodium: 142 mmol/L (ref 135–145)

## 2015-11-04 LAB — CREATININE, SERUM
Creatinine, Ser: 0.8 mg/dL (ref 0.44–1.00)
GFR calc Af Amer: >60 mL/min (ref >60)
GFR calc non Af Amer: >60 mL/min (ref >60)

## 2015-11-04 LAB — PLATELET COUNT: Platelets: 155 10*3/uL (ref 150–400)

## 2015-11-04 LAB — PREPARE RBC (CROSSMATCH)

## 2015-11-04 LAB — MAGNESIUM: Magnesium: 2.6 mg/dL — ABNORMAL HIGH (ref 1.7–2.4)

## 2015-11-04 SURGERY — REDO STERNOTOMY
Anesthesia: General

## 2015-11-04 MED ORDER — MUPIROCIN 2 % EX OINT
1.0000 "application " | TOPICAL_OINTMENT | Freq: Two times a day (BID) | CUTANEOUS | Status: AC
  Administered 2015-11-04 – 2015-11-08 (×10): 1 via NASAL
  Filled 2015-11-04 (×2): qty 22

## 2015-11-04 MED ORDER — SODIUM CHLORIDE 0.9 % IV SOLN
INTRAVENOUS | Status: DC
  Administered 2015-11-04: 13:00:00 via INTRAVENOUS

## 2015-11-04 MED ORDER — VANCOMYCIN HCL IN DEXTROSE 1-5 GM/200ML-% IV SOLN
1000.0000 mg | Freq: Once | INTRAVENOUS | Status: AC
  Administered 2015-11-04: 1000 mg via INTRAVENOUS
  Filled 2015-11-04: qty 200

## 2015-11-04 MED ORDER — METOPROLOL TARTRATE 1 MG/ML IV SOLN: 2.5000 mg | INTRAVENOUS | Status: DC | PRN

## 2015-11-04 MED ORDER — NOREPINEPHRINE BITARTRATE 1 MG/ML IV SOLN
0.0000 ug/min | INTRAVENOUS | Status: DC
  Filled 2015-11-04: qty 4

## 2015-11-04 MED ORDER — HEPARIN SODIUM (PORCINE) 1000 UNIT/ML IJ SOLN
INTRAMUSCULAR | Status: AC
  Filled 2015-11-04: qty 1

## 2015-11-04 MED ORDER — EPINEPHRINE HCL 1 MG/ML IJ SOLN
4000.0000 ug | INTRAVENOUS | Status: DC | PRN
  Administered 2015-11-04: 2 ug/min via INTRAVENOUS

## 2015-11-04 MED ORDER — EPINEPHRINE HCL 1 MG/ML IJ SOLN: 1.0000 ug/min | INTRAVENOUS | Status: DC

## 2015-11-04 MED ORDER — ATORVASTATIN CALCIUM 20 MG PO TABS
20.0000 mg | ORAL_TABLET | Freq: Every day | ORAL | Status: DC
  Administered 2015-11-05 – 2015-11-14 (×9): 20 mg via ORAL
  Filled 2015-11-04 (×10): qty 1

## 2015-11-04 MED ORDER — SODIUM CHLORIDE 0.45 % IV SOLN: INTRAVENOUS | Status: DC | PRN

## 2015-11-04 MED ORDER — PHENYLEPHRINE HCL 10 MG/ML IJ SOLN
INTRAMUSCULAR | Status: AC
  Filled 2015-11-04: qty 2

## 2015-11-04 MED ORDER — SODIUM CHLORIDE 0.9 % IJ SOLN: 3.0000 mL | INTRAMUSCULAR | Status: DC | PRN

## 2015-11-04 MED ORDER — MIDAZOLAM HCL 10 MG/2ML IJ SOLN
INTRAMUSCULAR | Status: AC
  Filled 2015-11-04: qty 2

## 2015-11-04 MED ORDER — FENTANYL CITRATE (PF) 250 MCG/5ML IJ SOLN
INTRAMUSCULAR | Status: AC
  Filled 2015-11-04: qty 25

## 2015-11-04 MED ORDER — BUSPIRONE HCL 15 MG PO TABS
15.0000 mg | ORAL_TABLET | Freq: Three times a day (TID) | ORAL | Status: DC
  Administered 2015-11-05 – 2015-11-15 (×30): 15 mg via ORAL
  Filled 2015-11-04 (×33): qty 1

## 2015-11-04 MED ORDER — PROTAMINE SULFATE 10 MG/ML IV SOLN
INTRAVENOUS | Status: DC | PRN
  Administered 2015-11-04: 200 mg via INTRAVENOUS

## 2015-11-04 MED ORDER — ACETAMINOPHEN 160 MG/5ML PO SOLN: 1000.0000 mg | Freq: Four times a day (QID) | ORAL | Status: AC

## 2015-11-04 MED ORDER — NOREPINEPHRINE BITARTRATE 1 MG/ML IV SOLN
4000.0000 ug | INTRAVENOUS | Status: DC | PRN
  Administered 2015-11-04: 2 ug/min via INTRAVENOUS

## 2015-11-04 MED ORDER — LACTATED RINGERS IV SOLN: 500.0000 mL | Freq: Once | INTRAVENOUS | Status: DC | PRN

## 2015-11-04 MED ORDER — ROCURONIUM BROMIDE 50 MG/5ML IV SOLN
INTRAVENOUS | Status: AC
  Filled 2015-11-04: qty 2

## 2015-11-04 MED ORDER — VECURONIUM BROMIDE 10 MG IV SOLR
INTRAVENOUS | Status: DC | PRN
  Administered 2015-11-04 (×2): 2 mg via INTRAVENOUS

## 2015-11-04 MED ORDER — MORPHINE SULFATE (PF) 2 MG/ML IV SOLN
2.0000 mg | INTRAVENOUS | Status: AC | PRN
  Administered 2015-11-04 (×2): 4 mg via INTRAVENOUS
  Administered 2015-11-04 (×2): 2 mg via INTRAVENOUS
  Administered 2015-11-05 (×3): 4 mg via INTRAVENOUS
  Administered 2015-11-05 (×2): 2 mg via INTRAVENOUS
  Filled 2015-11-04: qty 1
  Filled 2015-11-04 (×3): qty 2
  Filled 2015-11-04 (×2): qty 1
  Filled 2015-11-04: qty 2
  Filled 2015-11-04 (×2): qty 1

## 2015-11-04 MED ORDER — POTASSIUM CHLORIDE 10 MEQ/50ML IV SOLN: 10.0000 meq | INTRAVENOUS | Status: DC | PRN

## 2015-11-04 MED ORDER — BISACODYL 10 MG RE SUPP
10.0000 mg | Freq: Every day | RECTAL | Status: DC
  Filled 2015-11-04 (×2): qty 1

## 2015-11-04 MED ORDER — 0.9 % SODIUM CHLORIDE (POUR BTL) OPTIME
TOPICAL | Status: DC | PRN
  Administered 2015-11-04: 6000 mL

## 2015-11-04 MED ORDER — CETYLPYRIDINIUM CHLORIDE 0.05 % MT LIQD
7.0000 mL | Freq: Two times a day (BID) | OROMUCOSAL | Status: DC
  Administered 2015-11-04 – 2015-11-13 (×17): 7 mL via OROMUCOSAL

## 2015-11-04 MED ORDER — ALBUMIN HUMAN 5 % IV SOLN
INTRAVENOUS | Status: DC | PRN
  Administered 2015-11-04 (×2): via INTRAVENOUS

## 2015-11-04 MED ORDER — POTASSIUM CHLORIDE 10 MEQ/100ML IV SOLN
INTRAVENOUS | Status: DC | PRN
  Administered 2015-11-04: 10 meq via INTRAVENOUS

## 2015-11-04 MED ORDER — DOCUSATE SODIUM 100 MG PO CAPS
200.0000 mg | ORAL_CAPSULE | Freq: Every day | ORAL | Status: DC
  Administered 2015-11-05 – 2015-11-15 (×7): 200 mg via ORAL
  Filled 2015-11-04 (×7): qty 2

## 2015-11-04 MED ORDER — VECURONIUM BROMIDE 10 MG IV SOLR
INTRAVENOUS | Status: AC
  Filled 2015-11-04: qty 10

## 2015-11-04 MED ORDER — SODIUM CHLORIDE 0.9 % IV SOLN
250.0000 mL | INTRAVENOUS | Status: DC
  Administered 2015-11-05: 250 mL via INTRAVENOUS

## 2015-11-04 MED ORDER — LACTATED RINGERS IV SOLN
INTRAVENOUS | Status: DC
  Administered 2015-11-04: 14:00:00 via INTRAVENOUS

## 2015-11-04 MED ORDER — CALCIUM CHLORIDE 10 % IV SOLN
INTRAVENOUS | Status: DC | PRN
  Administered 2015-11-04: 200 mg via INTRAVENOUS
  Administered 2015-11-04: 100 mg via INTRAVENOUS
  Administered 2015-11-04: 200 mg via INTRAVENOUS

## 2015-11-04 MED ORDER — CHLORHEXIDINE GLUCONATE CLOTH 2 % EX PADS
6.0000 | MEDICATED_PAD | Freq: Every day | CUTANEOUS | Status: AC
  Administered 2015-11-05 – 2015-11-09 (×5): 6 via TOPICAL

## 2015-11-04 MED ORDER — LACTATED RINGERS IV SOLN
INTRAVENOUS | Status: DC
Start: 2015-11-04 — End: 2015-11-06
  Administered 2015-11-04: 10 mL via INTRAVENOUS

## 2015-11-04 MED ORDER — BUDESONIDE-FORMOTEROL FUMARATE 160-4.5 MCG/ACT IN AERO
2.0000 | INHALATION_SPRAY | Freq: Two times a day (BID) | RESPIRATORY_TRACT | Status: DC
  Administered 2015-11-04 – 2015-11-14 (×19): 2 via RESPIRATORY_TRACT
  Filled 2015-11-04: qty 6

## 2015-11-04 MED ORDER — OXYCODONE HCL 5 MG PO TABS
5.0000 mg | ORAL_TABLET | ORAL | Status: DC | PRN
  Administered 2015-11-05 – 2015-11-14 (×41): 10 mg via ORAL
  Administered 2015-11-14: 5 mg via ORAL
  Administered 2015-11-14 – 2015-11-15 (×5): 10 mg via ORAL
  Filled 2015-11-04 (×3): qty 2
  Filled 2015-11-04: qty 1
  Filled 2015-11-04 (×45): qty 2

## 2015-11-04 MED ORDER — SODIUM CHLORIDE 0.9 % IJ SOLN
INTRAMUSCULAR | Status: AC
  Filled 2015-11-04: qty 20

## 2015-11-04 MED ORDER — NITROGLYCERIN IN D5W 200-5 MCG/ML-% IV SOLN: 0.0000 ug/min | INTRAVENOUS | Status: DC

## 2015-11-04 MED ORDER — DULOXETINE HCL 60 MG PO CPEP
60.0000 mg | ORAL_CAPSULE | Freq: Every day | ORAL | Status: DC
  Administered 2015-11-05 – 2015-11-15 (×11): 60 mg via ORAL
  Filled 2015-11-04: qty 1
  Filled 2015-11-04: qty 2
  Filled 2015-11-04 (×9): qty 1

## 2015-11-04 MED ORDER — SODIUM CHLORIDE 0.9 % IJ SOLN
INTRAMUSCULAR | Status: DC | PRN
  Administered 2015-11-04 (×3): 4 mL via TOPICAL

## 2015-11-04 MED ORDER — PHENYLEPHRINE HCL 10 MG/ML IJ SOLN: 0.0000 ug/min | INTRAVENOUS | Status: DC

## 2015-11-04 MED ORDER — LEVALBUTEROL HCL 1.25 MG/0.5ML IN NEBU
1.2500 mg | INHALATION_SOLUTION | Freq: Four times a day (QID) | RESPIRATORY_TRACT | Status: DC
  Administered 2015-11-04 – 2015-11-09 (×18): 1.25 mg via RESPIRATORY_TRACT
  Filled 2015-11-04 (×17): qty 0.5

## 2015-11-04 MED ORDER — ARTIFICIAL TEARS OP OINT
TOPICAL_OINTMENT | OPHTHALMIC | Status: AC
  Filled 2015-11-04: qty 3.5

## 2015-11-04 MED ORDER — DEXTROSE 5 % IV SOLN
1.5000 g | Freq: Two times a day (BID) | INTRAVENOUS | Status: AC
  Administered 2015-11-04 – 2015-11-06 (×4): 1.5 g via INTRAVENOUS
  Filled 2015-11-04 (×6): qty 1.5

## 2015-11-04 MED ORDER — ONDANSETRON HCL 4 MG/2ML IJ SOLN
4.0000 mg | Freq: Four times a day (QID) | INTRAMUSCULAR | Status: DC | PRN
  Administered 2015-11-05: 4 mg via INTRAVENOUS
  Filled 2015-11-04: qty 2

## 2015-11-04 MED ORDER — ARTIFICIAL TEARS OP OINT
TOPICAL_OINTMENT | OPHTHALMIC | Status: DC | PRN
  Administered 2015-11-04: 1 via OPHTHALMIC

## 2015-11-04 MED ORDER — ACETAMINOPHEN 160 MG/5ML PO SOLN: 650.0000 mg | Freq: Once | ORAL | Status: AC

## 2015-11-04 MED ORDER — ROCURONIUM BROMIDE 100 MG/10ML IV SOLN
INTRAVENOUS | Status: DC | PRN
Start: 2015-11-04 — End: 2015-11-04
  Administered 2015-11-04: 50 mg via INTRAVENOUS

## 2015-11-04 MED ORDER — LACTATED RINGERS IV SOLN
INTRAVENOUS | Status: DC | PRN
  Administered 2015-11-04: 07:00:00 via INTRAVENOUS

## 2015-11-04 MED ORDER — PROPOFOL 10 MG/ML IV BOLUS
INTRAVENOUS | Status: AC
  Filled 2015-11-04: qty 20

## 2015-11-04 MED ORDER — MIDAZOLAM HCL 5 MG/5ML IJ SOLN
INTRAMUSCULAR | Status: DC | PRN
  Administered 2015-11-04 (×5): 1 mg via INTRAVENOUS
  Administered 2015-11-04 (×3): 2 mg via INTRAVENOUS
  Administered 2015-11-04: 1 mg via INTRAVENOUS

## 2015-11-04 MED ORDER — METOPROLOL TARTRATE 1 MG/ML IV SOLN
INTRAVENOUS | Status: AC
  Filled 2015-11-04: qty 5

## 2015-11-04 MED ORDER — POTASSIUM CHLORIDE 10 MEQ/50ML IV SOLN
10.0000 meq | INTRAVENOUS | Status: AC
  Administered 2015-11-04 (×2): 10 meq via INTRAVENOUS

## 2015-11-04 MED ORDER — CHLORHEXIDINE GLUCONATE 0.12 % MT SOLN
15.0000 mL | OROMUCOSAL | Status: AC
  Administered 2015-11-04: 15 mL via OROMUCOSAL

## 2015-11-04 MED ORDER — FAMOTIDINE IN NACL 20-0.9 MG/50ML-% IV SOLN
20.0000 mg | Freq: Two times a day (BID) | INTRAVENOUS | Status: AC
  Administered 2015-11-04: 20 mg via INTRAVENOUS

## 2015-11-04 MED ORDER — CHLORHEXIDINE GLUCONATE 0.12 % MT SOLN
15.0000 mL | Freq: Two times a day (BID) | OROMUCOSAL | Status: DC
  Administered 2015-11-04 – 2015-11-14 (×12): 15 mL via OROMUCOSAL
  Filled 2015-11-04 (×12): qty 15

## 2015-11-04 MED ORDER — MORPHINE SULFATE (PF) 2 MG/ML IV SOLN
1.0000 mg | INTRAVENOUS | Status: AC | PRN
  Administered 2015-11-04: 2 mg via INTRAVENOUS
  Filled 2015-11-04: qty 2

## 2015-11-04 MED ORDER — EPHEDRINE SULFATE 50 MG/ML IJ SOLN
INTRAMUSCULAR | Status: DC | PRN
  Administered 2015-11-04: 5 mg via INTRAVENOUS
  Administered 2015-11-04: 10 mg via INTRAVENOUS
  Administered 2015-11-04: 5 mg via INTRAVENOUS

## 2015-11-04 MED ORDER — HEMOSTATIC AGENTS (NO CHARGE) OPTIME
TOPICAL | Status: DC | PRN
  Administered 2015-11-04 (×3): 1 via TOPICAL

## 2015-11-04 MED ORDER — INSULIN REGULAR BOLUS VIA INFUSION
0.0000 [IU] | Freq: Three times a day (TID) | INTRAVENOUS | Status: DC
  Filled 2015-11-04: qty 10

## 2015-11-04 MED ORDER — MAGNESIUM SULFATE 4 GM/100ML IV SOLN
4.0000 g | Freq: Once | INTRAVENOUS | Status: AC
  Administered 2015-11-04: 4 g via INTRAVENOUS
  Filled 2015-11-04: qty 100

## 2015-11-04 MED ORDER — POTASSIUM CHLORIDE 10 MEQ/50ML IV SOLN
10.0000 meq | INTRAVENOUS | Status: AC
  Administered 2015-11-04 (×3): 10 meq via INTRAVENOUS

## 2015-11-04 MED ORDER — POTASSIUM CHLORIDE 10 MEQ/50ML IV SOLN
10.0000 meq | INTRAVENOUS | Status: AC
  Administered 2015-11-04 (×3): 10 meq via INTRAVENOUS
  Filled 2015-11-04 (×3): qty 50

## 2015-11-04 MED ORDER — SODIUM CHLORIDE 0.9 % IV SOLN
INTRAVENOUS | Status: DC
  Administered 2015-11-05: 01:00:00 via INTRAVENOUS
  Filled 2015-11-04: qty 2.5

## 2015-11-04 MED ORDER — LIDOCAINE HCL (CARDIAC) 20 MG/ML IV SOLN
INTRAVENOUS | Status: DC | PRN
  Administered 2015-11-04: 80 mg via INTRAVENOUS

## 2015-11-04 MED ORDER — LIDOCAINE HCL (CARDIAC) 20 MG/ML IV SOLN
INTRAVENOUS | Status: AC
  Filled 2015-11-04: qty 5

## 2015-11-04 MED ORDER — MIDAZOLAM HCL 2 MG/2ML IJ SOLN
INTRAMUSCULAR | Status: AC
  Filled 2015-11-04: qty 2

## 2015-11-04 MED ORDER — PANTOPRAZOLE SODIUM 40 MG PO TBEC
40.0000 mg | DELAYED_RELEASE_TABLET | Freq: Every day | ORAL | Status: DC
  Administered 2015-11-06 – 2015-11-15 (×10): 40 mg via ORAL
  Filled 2015-11-04 (×10): qty 1

## 2015-11-04 MED ORDER — MIDAZOLAM HCL 2 MG/2ML IJ SOLN: 2.0000 mg | INTRAMUSCULAR | Status: AC | PRN

## 2015-11-04 MED ORDER — AMIODARONE HCL IN DEXTROSE 360-4.14 MG/200ML-% IV SOLN
60.0000 mg/h | INTRAVENOUS | Status: DC
  Filled 2015-11-04: qty 200

## 2015-11-04 MED ORDER — LEVALBUTEROL HCL 1.25 MG/0.5ML IN NEBU: 1.2500 mg | INHALATION_SOLUTION | Freq: Four times a day (QID) | RESPIRATORY_TRACT | Status: DC

## 2015-11-04 MED ORDER — METOPROLOL TARTRATE 25 MG/10 ML ORAL SUSPENSION: 12.5000 mg | Freq: Two times a day (BID) | ORAL | Status: DC

## 2015-11-04 MED ORDER — ACETAMINOPHEN 500 MG PO TABS
1000.0000 mg | ORAL_TABLET | Freq: Four times a day (QID) | ORAL | Status: AC
  Administered 2015-11-05 – 2015-11-09 (×12): 1000 mg via ORAL
  Filled 2015-11-04 (×14): qty 2

## 2015-11-04 MED ORDER — ACETAMINOPHEN 650 MG RE SUPP
650.0000 mg | Freq: Once | RECTAL | Status: AC
  Administered 2015-11-04: 650 mg via RECTAL

## 2015-11-04 MED ORDER — METOPROLOL TARTRATE 1 MG/ML IV SOLN
INTRAVENOUS | Status: DC | PRN
  Administered 2015-11-04: 2 mg via INTRAVENOUS

## 2015-11-04 MED ORDER — METOPROLOL TARTRATE 12.5 MG HALF TABLET
12.5000 mg | ORAL_TABLET | Freq: Two times a day (BID) | ORAL | Status: DC
  Administered 2015-11-05 – 2015-11-06 (×3): 12.5 mg via ORAL
  Filled 2015-11-04 (×3): qty 1

## 2015-11-04 MED ORDER — ASPIRIN EC 325 MG PO TBEC
325.0000 mg | DELAYED_RELEASE_TABLET | Freq: Every day | ORAL | Status: DC
  Administered 2015-11-05 – 2015-11-06 (×2): 325 mg via ORAL
  Filled 2015-11-04 (×2): qty 1

## 2015-11-04 MED ORDER — POTASSIUM CHLORIDE 10 MEQ/50ML IV SOLN
10.0000 meq | Freq: Once | INTRAVENOUS | Status: DC
  Filled 2015-11-04: qty 50

## 2015-11-04 MED ORDER — FENTANYL CITRATE (PF) 100 MCG/2ML IJ SOLN
INTRAMUSCULAR | Status: DC | PRN
  Administered 2015-11-04 (×2): 50 ug via INTRAVENOUS
  Administered 2015-11-04: 100 ug via INTRAVENOUS
  Administered 2015-11-04: 150 ug via INTRAVENOUS
  Administered 2015-11-04: 100 ug via INTRAVENOUS
  Administered 2015-11-04: 50 ug via INTRAVENOUS
  Administered 2015-11-04: 250 ug via INTRAVENOUS

## 2015-11-04 MED ORDER — PROPRANOLOL HCL 1 MG/ML IV SOLN
INTRAVENOUS | Status: DC | PRN
  Administered 2015-11-04 (×2): .25 mg via INTRAVENOUS

## 2015-11-04 MED ORDER — HEPARIN SODIUM (PORCINE) 1000 UNIT/ML IJ SOLN
INTRAMUSCULAR | Status: DC | PRN
  Administered 2015-11-04: 20000 [IU] via INTRAVENOUS

## 2015-11-04 MED ORDER — ALBUMIN HUMAN 5 % IV SOLN
250.0000 mL | INTRAVENOUS | Status: AC | PRN
  Administered 2015-11-04 (×2): 250 mL via INTRAVENOUS

## 2015-11-04 MED ORDER — BISACODYL 5 MG PO TBEC
10.0000 mg | DELAYED_RELEASE_TABLET | Freq: Every day | ORAL | Status: DC
  Administered 2015-11-05 – 2015-11-07 (×3): 10 mg via ORAL
  Filled 2015-11-04 (×7): qty 2

## 2015-11-04 MED ORDER — CALCIUM CHLORIDE 10 % IV SOLN
INTRAVENOUS | Status: AC
  Filled 2015-11-04: qty 10

## 2015-11-04 MED ORDER — ASPIRIN 81 MG PO CHEW: 324.0000 mg | CHEWABLE_TABLET | Freq: Every day | ORAL | Status: DC

## 2015-11-04 MED ORDER — DEXMEDETOMIDINE HCL IN NACL 200 MCG/50ML IV SOLN
0.0000 ug/kg/h | INTRAVENOUS | Status: DC
  Filled 2015-11-04: qty 50

## 2015-11-04 MED ORDER — PROPOFOL 10 MG/ML IV BOLUS
INTRAVENOUS | Status: DC | PRN
  Administered 2015-11-04: 40 mg via INTRAVENOUS

## 2015-11-04 MED ORDER — SODIUM CHLORIDE 0.9 % IJ SOLN
3.0000 mL | Freq: Two times a day (BID) | INTRAMUSCULAR | Status: DC
  Administered 2015-11-05 – 2015-11-11 (×8): 3 mL via INTRAVENOUS

## 2015-11-04 MED ORDER — DEXTROSE 5 % IV SOLN
2.0000 ug/min | INTRAVENOUS | Status: DC
  Administered 2015-11-04: 9 ug/min via INTRAVENOUS
  Filled 2015-11-04 (×2): qty 4

## 2015-11-04 MED FILL — Electrolyte-R (PH 7.4) Solution: INTRAVENOUS | Qty: 7000 | Status: AC

## 2015-11-04 MED FILL — Heparin Sodium (Porcine) Inj 1000 Unit/ML: INTRAMUSCULAR | Qty: 10 | Status: AC

## 2015-11-04 MED FILL — Potassium Chloride Inj 2 mEq/ML: INTRAVENOUS | Qty: 40 | Status: AC

## 2015-11-04 MED FILL — Sodium Bicarbonate IV Soln 8.4%: INTRAVENOUS | Qty: 50 | Status: AC

## 2015-11-04 MED FILL — Magnesium Sulfate Inj 50%: INTRAMUSCULAR | Qty: 10 | Status: AC

## 2015-11-04 MED FILL — Sodium Chloride IV Soln 0.9%: INTRAVENOUS | Qty: 2000 | Status: AC

## 2015-11-04 MED FILL — Heparin Sodium (Porcine) Inj 1000 Unit/ML: INTRAMUSCULAR | Qty: 30 | Status: AC

## 2015-11-04 MED FILL — Mannitol IV Soln 20%: INTRAVENOUS | Qty: 500 | Status: AC

## 2015-11-04 MED FILL — Lidocaine HCl IV Inj 20 MG/ML: INTRAVENOUS | Qty: 5 | Status: AC

## 2015-11-04 SURGICAL SUPPLY — 107 items
ADAPTER CARDIO PERF ANTE/RETRO (ADAPTER) ×3 IMPLANT
ADPR PRFSN 84XANTGRD RTRGD (ADAPTER) ×1
BAG DECANTER FOR FLEXI CONT (MISCELLANEOUS) ×3 IMPLANT
BANDAGE ELASTIC 4 VELCRO ST LF (GAUZE/BANDAGES/DRESSINGS) ×1 IMPLANT
BANDAGE ELASTIC 6 VELCRO ST LF (GAUZE/BANDAGES/DRESSINGS) ×1 IMPLANT
BASKET HEART  (ORDER IN 25'S) (MISCELLANEOUS)
BASKET HEART (ORDER IN 25'S) (MISCELLANEOUS)
BASKET HEART (ORDER IN 25S) (MISCELLANEOUS) ×1 IMPLANT
BLADE CORE FAN STRYKER (BLADE) ×1 IMPLANT
BLADE STERNUM SYSTEM 6 (BLADE) ×3 IMPLANT
BLADE SURG 12 STRL SS (BLADE) ×3 IMPLANT
BLADE SURG ROTATE 9660 (MISCELLANEOUS) IMPLANT
BNDG GAUZE ELAST 4 BULKY (GAUZE/BANDAGES/DRESSINGS) ×1 IMPLANT
CANISTER SUCTION 2500CC (MISCELLANEOUS) ×3 IMPLANT
CANN PRFSN 3/8XCNCT ST RT ANG (MISCELLANEOUS) ×1
CANN PRFSN 3/8XRT ANG TPR 14 (MISCELLANEOUS) ×1
CANNULA GUNDRY RCSP 15FR (MISCELLANEOUS) ×3 IMPLANT
CANNULA PRFSN 3/8XCNCT RT ANG (MISCELLANEOUS) IMPLANT
CANNULA PRFSN 3/8XRT ANG TPR14 (MISCELLANEOUS) IMPLANT
CANNULA SUMP PERICARDIAL (CANNULA) ×2 IMPLANT
CANNULA VEN MTL TIP RT (MISCELLANEOUS) ×6
CATH CPB KIT VANTRIGT (MISCELLANEOUS) ×3 IMPLANT
CATH ROBINSON RED A/P 18FR (CATHETERS) ×9 IMPLANT
CATH THORACIC 36FR RT ANG (CATHETERS) ×3 IMPLANT
CONN 1/2X1/2X1/2  BEN (MISCELLANEOUS) ×2
CONN 1/2X1/2X1/2 BEN (MISCELLANEOUS) IMPLANT
CONN 3/8X1/2 ST GISH (MISCELLANEOUS) ×2 IMPLANT
CONT SPEC 4OZ CLIKSEAL STRL BL (MISCELLANEOUS) ×2 IMPLANT
COVER SURGICAL LIGHT HANDLE (MISCELLANEOUS) ×1 IMPLANT
CRADLE DONUT ADULT HEAD (MISCELLANEOUS) ×3 IMPLANT
DRAIN CHANNEL 32F RND 10.7 FF (WOUND CARE) ×3 IMPLANT
DRAPE CARDIOVASCULAR INCISE (DRAPES) ×3
DRAPE PROXIMA HALF (DRAPES) ×2 IMPLANT
DRAPE SLUSH/WARMER DISC (DRAPES) ×3 IMPLANT
DRAPE SRG 135X102X78XABS (DRAPES) ×1 IMPLANT
DRSG AQUACEL AG ADV 3.5X14 (GAUZE/BANDAGES/DRESSINGS) ×3 IMPLANT
ELECT BLADE 4.0 EZ CLEAN MEGAD (MISCELLANEOUS) ×3
ELECT BLADE 6.5 EXT (BLADE) ×3 IMPLANT
ELECT CAUTERY BLADE 6.4 (BLADE) ×3 IMPLANT
ELECT REM PT RETURN 9FT ADLT (ELECTROSURGICAL) ×6
ELECTRODE BLDE 4.0 EZ CLN MEGD (MISCELLANEOUS) ×1 IMPLANT
ELECTRODE REM PT RTRN 9FT ADLT (ELECTROSURGICAL) ×2 IMPLANT
GAUZE SPONGE 4X4 12PLY STRL (GAUZE/BANDAGES/DRESSINGS) ×4 IMPLANT
GLOVE BIO SURGEON STRL SZ7.5 (GLOVE) ×9 IMPLANT
GOWN STRL REUS W/ TWL LRG LVL3 (GOWN DISPOSABLE) ×4 IMPLANT
GOWN STRL REUS W/TWL LRG LVL3 (GOWN DISPOSABLE) ×24
HEMOSTAT POWDER SURGIFOAM 1G (HEMOSTASIS) ×9 IMPLANT
HEMOSTAT SURGICEL 2X14 (HEMOSTASIS) ×3 IMPLANT
INSERT FOGARTY XLG (MISCELLANEOUS) IMPLANT
KIT BASIN OR (CUSTOM PROCEDURE TRAY) ×3 IMPLANT
KIT ROOM TURNOVER OR (KITS) ×3 IMPLANT
KIT SUCTION CATH 14FR (SUCTIONS) ×3 IMPLANT
KIT VASOVIEW W/TROCAR VH 2000 (KITS) ×1 IMPLANT
LEAD PACING MYOCARDI (MISCELLANEOUS) ×3 IMPLANT
LINE VENT (MISCELLANEOUS) ×2 IMPLANT
LOOP VESSEL SUPERMAXI WHITE (MISCELLANEOUS) ×2 IMPLANT
MARKER GRAFT CORONARY BYPASS (MISCELLANEOUS) ×3 IMPLANT
NS IRRIG 1000ML POUR BTL (IV SOLUTION) ×17 IMPLANT
PACK OPEN HEART (CUSTOM PROCEDURE TRAY) ×3 IMPLANT
PAD ARMBOARD 7.5X6 YLW CONV (MISCELLANEOUS) ×6 IMPLANT
PAD ELECT DEFIB RADIOL ZOLL (MISCELLANEOUS) ×3 IMPLANT
PENCIL BUTTON HOLSTER BLD 10FT (ELECTRODE) ×1 IMPLANT
PUNCH AORTIC ROTATE 4.0MM (MISCELLANEOUS) IMPLANT
PUNCH AORTIC ROTATE 4.5MM 8IN (MISCELLANEOUS) IMPLANT
PUNCH AORTIC ROTATE 5MM 8IN (MISCELLANEOUS) IMPLANT
SET CARDIOPLEGIA MPS 5001102 (MISCELLANEOUS) ×2 IMPLANT
SURGIFLO W/THROMBIN 8M KIT (HEMOSTASIS) ×5 IMPLANT
SUT BONE WAX W31G (SUTURE) ×3 IMPLANT
SUT ETHIBOND 2 0 SH (SUTURE) ×12
SUT ETHIBOND 2 0 SH 36X2 (SUTURE) IMPLANT
SUT MNCRL AB 4-0 PS2 18 (SUTURE) IMPLANT
SUT PROLENE 3 0 SH DA (SUTURE) ×6 IMPLANT
SUT PROLENE 3 0 SH1 36 (SUTURE) ×2 IMPLANT
SUT PROLENE 4 0 RB 1 (SUTURE) ×27
SUT PROLENE 4 0 SH DA (SUTURE) ×5 IMPLANT
SUT PROLENE 4-0 RB1 .5 CRCL 36 (SUTURE) ×1 IMPLANT
SUT PROLENE 5 0 C 1 36 (SUTURE) ×12 IMPLANT
SUT PROLENE 6 0 C 1 30 (SUTURE) ×6 IMPLANT
SUT PROLENE 6 0 CC (SUTURE) ×5 IMPLANT
SUT PROLENE 8 0 BV175 6 (SUTURE) ×4 IMPLANT
SUT PROLENE BLUE 7 0 (SUTURE) ×1 IMPLANT
SUT SILK  1 MH (SUTURE) ×6
SUT SILK 1 MH (SUTURE) IMPLANT
SUT SILK 1 TIES 10X30 (SUTURE) ×2 IMPLANT
SUT SILK 2 0 SH CR/8 (SUTURE) ×6 IMPLANT
SUT SILK 2 0 TIES 10X30 (SUTURE) ×2 IMPLANT
SUT SILK 2 0 TIES 17X18 (SUTURE) ×3
SUT SILK 2-0 18XBRD TIE BLK (SUTURE) IMPLANT
SUT SILK 3 0 SH CR/8 (SUTURE) ×2 IMPLANT
SUT SILK 4 0 TIE 10X30 (SUTURE) ×4 IMPLANT
SUT STEEL 6MS V (SUTURE) ×6 IMPLANT
SUT STEEL SZ 6 DBL 3X14 BALL (SUTURE) ×3 IMPLANT
SUT TEM PAC WIRE 2 0 SH (SUTURE) ×8 IMPLANT
SUT VIC AB 1 CTX 36 (SUTURE) ×9
SUT VIC AB 1 CTX36XBRD ANBCTR (SUTURE) ×2 IMPLANT
SUT VIC AB 2-0 CT1 27 (SUTURE)
SUT VIC AB 2-0 CT1 TAPERPNT 27 (SUTURE) IMPLANT
SUT VIC AB 2-0 CTX 27 (SUTURE) ×4 IMPLANT
SUT VIC AB 3-0 X1 27 (SUTURE) ×4 IMPLANT
SUTURE E-PAK OPEN HEART (SUTURE) ×1 IMPLANT
SYSTEM SAHARA CHEST DRAIN ATS (WOUND CARE) ×3 IMPLANT
TOWEL OR 17X24 6PK STRL BLUE (TOWEL DISPOSABLE) ×4 IMPLANT
TOWEL OR 17X26 10 PK STRL BLUE (TOWEL DISPOSABLE) ×4 IMPLANT
TRAY FOLEY IC TEMP SENS 16FR (CATHETERS) ×3 IMPLANT
TUBING INSUFFLATION (TUBING) ×1 IMPLANT
UNDERPAD 30X30 INCONTINENT (UNDERPADS AND DIAPERS) ×3 IMPLANT
WATER STERILE IRR 1000ML POUR (IV SOLUTION) ×6 IMPLANT

## 2015-11-04 NOTE — Progress Notes (Addendum)
The patient was examined and preop studies reviewed. There has been no change from the prior exam and the patient is ready for surgery.   Plan resection of left atrial myxoma on D Ch[place]shi  This procedure has been fully reviewed with the patient and written informed consent has been obtained.  This procedure has been fully reviewed with the patient and written informed consent has been obtained.  In addition to other potential risks and complications from the surgery, I have made the patient aware of the recent [place] concerning the risk of infection by Myocobacterium chimaera related to the use of [place] 3T heater-cooler equipment during cardiac surgery. I discussed with the patient the low risk of infection, as well as our compliance with the most current FDA recommendations to minimize infection and testing of all devices for contamination. The patient has been made aware of the limited alternatives to immediately replacing the current equipment. The patient has been informed regarding the risks associated with waiting to proceed with needed surgery and that such risks are greater than the risk of infection related to the use of the heater-cooler device. I did make the patient aware that after careful review of the patients having cardiac surgery at [facility] we have no evidence that heater/cooler related infections have occurred at [facility]. We discussed that this is a slow-growing bacterium, such that it can take some period of time for symptoms to develop.

## 2015-11-04 NOTE — Brief Op Note (Signed)
11/04/2015      [address]       [place] [address]             [phone]     11/04/2015  10:48 AM  PATIENT:  Julia Schwartz  65 y.o. female  PRE-OPERATIVE DIAGNOSIS:  LEFT ATRIAL MYXOMA  POST-OPERATIVE DIAGNOSIS:  LEFT ATRIAL MYXOMA  PROCEDURE:  Procedure(s): STERNOTOMY RESECTION OF LEFT ATRIAL MYXOMA, BI-ATRIAL APPROACH WITH PERICARDIAL PATCH TRANSESOPHAGEAL ECHOCARDIOGRAM (TEE)  SURGEON:  Surgeon(s): Ivin Poot, MD  PHYSICIAN ASSISTANT: WAYNE GOLD PA-C  ANESTHESIA:   general  PATIENT CONDITION:  ICU - intubated and hemodynamically stable.  PRE-OPERATIVE WEIGHT: [id number]  COMPLICATIONS: NO KNOWN

## 2015-11-04 NOTE — Progress Notes (Signed)
      [address]       [place] [address]             [phone]      S/p resection left atrial myxoma  Intubated but waking up , following commands  BP 106/61 mmHg  Pulse 97  Temp(Src) 97.2 F (36.2 C) (Core (Comment))  Resp 17  Ht 5\' 1"  (1.549 m)  Wt 160 lb (72.576 kg)  BMI 30.25 kg/m2  SpO2 99%  CI= 2.5 on epi and norepi   Intake/Output Summary (Last 24 hours) at 11/04/15 1837 Last data filed at 11/04/15 1800  Gross per 24 hour  Intake 3023.83 ml  Output   1390 ml  Net 1633.83 ml    Doing well early postop  Remo Lipps C. Roxan Hockey, MD [facility] [phone]

## 2015-11-04 NOTE — OR Nursing (Signed)
Forty-five minute call to SICU charge nurse at 1118.

## 2015-11-04 NOTE — Progress Notes (Addendum)
Echocardiogram Echocardiogram Transesophageal has been performed.  Julia Schwartz 11/04/2015, 8:59 AM

## 2015-11-04 NOTE — Progress Notes (Signed)
Utilization review completed. Sayf Kerner, RN, BSN. 

## 2015-11-04 NOTE — Transfer of Care (Signed)
Immediate Anesthesia Transfer of Care Note  Patient: Julia Schwartz  Procedure(s) Performed: Procedure(s): STERNOTOMY (N/A) RESECTION OF LEFT ATRIAL MYXOMA, BI-ATRIAL APPROACH WITH PERICARDIAL PATCH (N/A) TRANSESOPHAGEAL ECHOCARDIOGRAM (TEE) (N/A)  Patient Location: SICU  Anesthesia Type:General  Level of Consciousness: Patient remains intubated per anesthesia plan  Airway & Oxygen Therapy: Patient remains intubated per anesthesia plan and Patient placed on Ventilator (see vital sign flow sheet for setting)  Post-op Assessment: Report given to RN and Post -op Vital signs reviewed and stable  Post vital signs: Reviewed and stable  Last Vitals:  Filed Vitals:   11/04/15 0547  BP: 130/76  Pulse: 87  Temp: 36.9 C  Resp: 20    Complications: No apparent anesthesia complications

## 2015-11-04 NOTE — Anesthesia Procedure Notes (Addendum)
Central Venous Catheter Insertion Performed by: anesthesiologist 11/04/2015 6:50 AM Patient location: OOR procedure area. Preanesthetic checklist: patient identified, IV checked, site marked, risks and benefits discussed, surgical consent, monitors and equipment checked, pre-op evaluation, timeout performed and anesthesia consent Position: Trendelenburg Lidocaine 1% used for infiltration Landmarks identified and Seldinger technique used Catheter size: 7.5 Fr PA cath was placed.Single lumen Swan type and PA catheter depth:thermodilation and 48PA Cath depth:48 Procedure performed using ultrasound guided technique. Attempts: 1 Following insertion, line sutured. Post procedure assessment: blood return through all ports, free fluid flow and no air. Patient tolerated the procedure well with no immediate complications.   Procedure Name: Intubation Date/Time: 11/04/2015 7:45 AM Performed by: Gaylene Brooks Pre-anesthesia Checklist: Patient identified, Timeout performed, Emergency Drugs available, Suction available and Patient being monitored Patient Re-evaluated:Patient Re-evaluated prior to inductionOxygen Delivery Method: Circle system utilized Preoxygenation: Pre-oxygenation with 100% oxygen Intubation Type: IV induction Ventilation: Mask ventilation without difficulty and Oral airway inserted - appropriate to patient size Laryngoscope Size: Sabra Heck and 2 Grade View: Grade I Tube type: Oral Tube size: 7.5 mm Number of attempts: 1 Airway Equipment and Method: Stylet Placement Confirmation: ETT inserted through vocal cords under direct vision,  breath sounds checked- equal and bilateral,  positive ETCO2 and CO2 detector Secured at: 19 cm Tube secured with: Tape Dental Injury: Teeth and Oropharynx as per pre-operative assessment

## 2015-11-04 NOTE — Plan of Care (Signed)
Problem: Phase II - Intermediate Post-Op Goal: Wean to Extubate Outcome: Progressing Precedex off at 1630. Waking slowly.  Will MAE to command.Marland Kitchen RUE weaker than LUE. This is baseline.

## 2015-11-04 NOTE — OR Nursing (Signed)
Second call to SICU charge nurse at 1202. Spoke with Lattie Haw.

## 2015-11-04 NOTE — Op Note (Signed)
NAMEMarland Kitchen  CONSUELLO, UNDERHILL NO.:  [id number]  MEDICAL RECORD NO.:  [id number]  LOCATION:  2S07C                        FACILITY:  [facility]  PHYSICIAN:  Ivin Poot, M.D.  DATE OF BIRTH:  May 09, 1951  DATE OF PROCEDURE:  11/04/2015 DATE OF DISCHARGE:                              OPERATIVE REPORT   OPERATION: 1. Median sternotomy. 2. 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  SURGEON:  Ivin Poot, M.D.  ASSISTANT:  Jadene Pierini, PA-C.  PREOPERATIVE DIAGNOSES:  A 3-cm left atrial myxoma, and history of recent stroke.  POSTOPERATIVE DIAGNOSES:  A 3-cm left atrial myxoma, and history of recent stroke.  ANESTHESIA:  General by Dr. Rica Koyanagi.  INDICATIONS:  The patient is a 65 year old, overweight African-American female, with hypertension and diabetes, who presented last day of December with a stroke.  She was evaluated and echocardiogram showed evidence of a left atrial mass attached to the interatrial septum consistent with atrial myxoma.  She underwent cardiac catheterization that showed no significant coronary artery disease.  Her LV function was well preserved.  She began to resolve the symptoms of her stroke and was able to be discharged home.  She was started on anticoagulation as recommended by Neurology.  I initially saw her in the hospital where a diagnosis of atrial myxoma was made.  I followed up with an office visit prior to her operation.  At that time, she was fairly well recovered from the stroke and able to walk up and down the hallway in the office without difficulty.  I discussed with her the details of median sternotomy and resection of her left atrial myxoma including indications, alternatives, benefits, and associated risks of worsening neurologic function, postoperative bleeding requiring blood transfusion, postoperative lung problems including  pleural effusion, postoperative infection, MI, multiorgan failure, and death.  She demonstrated her understanding and agreed to proceed with surgery under what I felt was an informed consent.  OPERATIVE FINDINGS: 1. A 3-cm left atrial myxoma attached to the interatrial septum in the     area of the fossa ovalis, which was completely excised with a     portion of the interatrial septum to which the tumor was attached. 2. No blood products required for the surgery.  OPERATIVE PROCEDURE:  The patient was brought to the operating room and placed supine on the operating table where general anesthesia was induced under invasive hemodynamic monitoring.  A transesophageal echo probe was placed by the anesthesiologist and this confirmed the presence of a left atrial myxoma.  The patient was prepped and draped as a sterile field.  A proper time-out was performed.  A sternal incision was made.  A sternotomy was performed.  The sternum was retracted and the pericardium was opened.  There was no pericardial effusion.  The pericardium was elevated.  Heparin was administered.  Pursestrings were placed in the ascending aorta and into the superior and inferior vena cava followed by Fick cable cannulation.  When the ACT was documented as being therapeutic, the patient was then cannulated in the ascending aorta with both the SVC and IVC and  placed on cardiopulmonary bypass. Vessel loops were placed around the venous cannula and the interatrial groove was dissected.  Cardioplegia cannulas were placed for both antegrade and retrograde cold blood cardioplegia.  The patient was cooled to 32 degrees.  The aortic crossclamp was applied.  One liter of cold blood cardioplegia was delivered in split doses between the antegrade aortic and retrograde coronary sinus catheters.  There was good cardioplegic arrest and septal temperature dropped less than 14 degrees.  Cardioplegia was delivered every 20 minutes while  the cross- clamp was in place.  The operative field was insufflated with CO2 to reduce problems with an intracardiac air.  A bi-atrial approach to the myxoma was performed.  First, a left atriotomy was performed.  The myxoma was present on the anterior superior surface of the left atrium and was attached in a sessile type of manner without a stalk or narrow pedicle.  Next, a right atriotomy was made as the cable tapes were occluded.  An incision was made in the fossa ovalis at the outer margin and immediately the tumor appeared in the right atrium and prolapsed on a base of interatrial septum.  This base of interatrial septum was excised with an 11 blade scalpel and then specimen was removed en bloc with attachment to the interatrial septum.  This was sent to pathology.  The atrium was inspected and there was no further myxoma or tumor or abnormality.  The interatrial septum was measured to a pericardial patch to the patient's own pericardium which had been processed with glutaraldehyde from the tissue to facilitate suturing.  The pericardium was cut to proper size and shape.  The interatrial septum was somewhat thickened, and some interrupted 5-0 Prolene's were used to oppose the intimal layers from the septal tissue.  Next, the patch was sewn to close the defect using running 4-0 Prolene. The patch was tested and found to the saline tight.  The atria were then irrigated and both atrial chambers were closed with running Prolene in a double layer.  Air was vented from the left side of the heart and the coronaries with a dose of retrograde warm blood cardioplegia in the usual de-airing maneuvers.  With the patient in steep Trendelenburg, the cross-clamp was removed and the heart was reperfused.  Heart resumed a spontaneous rhythm.  The atriotomy incisions were checked and found to be hemostatic.  Temporary pacing wires were applied.  The patient was rewarmed and reperfused.  Both  lungs were expanded and ventilator was resumed.  The patient was then weaned off cardiopulmonary bypass on low-dose norepinephrine to maintain adequate SVR.  Cardiac function appeared preserved, and there was no evidence of an ASD by intraoperative color Doppler or a bubble study.  Protamine was administered without adverse reaction and the cannulas were removed.  The cardioplegia cannula was removed.  The patient remained stable.  The superior pericardial fat was closed over the aorta and right ventricle.  Anterior mediastinal and right pleural chest tube were placed and brought out through separate incisions. The sternum was closed with interrupted steel wire.  The pectoralis fascia was closed with a running #1 Vicryl.  The subcutaneous and skin layers were closed in running Vicryl and sterile dressings were applied. Total cardiopulmonary bypass time was 110 minutes.     Ivin Poot, M.D.     PV/MEDQ  D:  11/04/2015  T:  11/04/2015  Job:  [id number]

## 2015-11-05 ENCOUNTER — Inpatient Hospital Stay (HOSPITAL_COMMUNITY): Payer: Medicare Other

## 2015-11-05 ENCOUNTER — Encounter (HOSPITAL_COMMUNITY): Payer: Self-pay | Admitting: Cardiothoracic Surgery

## 2015-11-05 LAB — GLUCOSE, CAPILLARY
Glucose-Capillary: 104 mg/dL — ABNORMAL HIGH (ref 65–99)
Glucose-Capillary: 104 mg/dL — ABNORMAL HIGH (ref 65–99)
Glucose-Capillary: 105 mg/dL — ABNORMAL HIGH (ref 65–99)
Glucose-Capillary: 107 mg/dL — ABNORMAL HIGH (ref 65–99)
Glucose-Capillary: 107 mg/dL — ABNORMAL HIGH (ref 65–99)
Glucose-Capillary: 109 mg/dL — ABNORMAL HIGH (ref 65–99)
Glucose-Capillary: 109 mg/dL — ABNORMAL HIGH (ref 65–99)
Glucose-Capillary: 109 mg/dL — ABNORMAL HIGH (ref 65–99)
Glucose-Capillary: 110 mg/dL — ABNORMAL HIGH (ref 65–99)
Glucose-Capillary: 110 mg/dL — ABNORMAL HIGH (ref 65–99)
Glucose-Capillary: 111 mg/dL — ABNORMAL HIGH (ref 65–99)
Glucose-Capillary: 113 mg/dL — ABNORMAL HIGH (ref 65–99)
Glucose-Capillary: 114 mg/dL — ABNORMAL HIGH (ref 65–99)
Glucose-Capillary: 117 mg/dL — ABNORMAL HIGH (ref 65–99)
Glucose-Capillary: 120 mg/dL — ABNORMAL HIGH (ref 65–99)
Glucose-Capillary: 120 mg/dL — ABNORMAL HIGH (ref 65–99)
Glucose-Capillary: 123 mg/dL — ABNORMAL HIGH (ref 65–99)
Glucose-Capillary: 127 mg/dL — ABNORMAL HIGH (ref 65–99)
Glucose-Capillary: 129 mg/dL — ABNORMAL HIGH (ref 65–99)
Glucose-Capillary: 130 mg/dL — ABNORMAL HIGH (ref 65–99)
Glucose-Capillary: 138 mg/dL — ABNORMAL HIGH (ref 65–99)
Glucose-Capillary: 149 mg/dL — ABNORMAL HIGH (ref 65–99)
Glucose-Capillary: 160 mg/dL — ABNORMAL HIGH (ref 65–99)
Glucose-Capillary: 78 mg/dL (ref 65–99)
Glucose-Capillary: 99 mg/dL (ref 65–99)

## 2015-11-05 LAB — CBC
HCT: 30 % — ABNORMAL LOW (ref 36.0–46.0)
HEMATOCRIT: 20.4 % — AB (ref 36.0–46.0)
HEMOGLOBIN: 6.8 g/dL — AB (ref 12.0–15.0)
Hemoglobin: 10.1 g/dL — ABNORMAL LOW (ref 12.0–15.0)
MCH: 29.4 pg (ref 26.0–34.0)
MCH: 29.4 pg (ref 26.0–34.0)
MCHC: 33.3 g/dL (ref 30.0–36.0)
MCHC: 33.7 g/dL (ref 30.0–36.0)
MCV: 88.3 fL (ref 78.0–100.0)
MCV: 87.5 fL (ref 78.0–100.0)
PLATELETS: 99 10*3/uL — AB (ref 150–400)
Platelets: 118 10*3/uL — ABNORMAL LOW (ref 150–400)
RBC: 2.31 MIL/uL — AB (ref 3.87–5.11)
RBC: 3.43 MIL/uL — AB (ref 3.87–5.11)
RDW: 13.6 % (ref 11.5–15.5)
RDW: 14.2 % (ref 11.5–15.5)
WBC: 8.1 10*3/uL (ref 4.0–10.5)
WBC: 11.5 10*3/uL — ABNORMAL HIGH (ref 4.0–10.5)

## 2015-11-05 LAB — POCT I-STAT, CHEM 8
BUN: 5 mg/dL — ABNORMAL LOW (ref 6–20)
Calcium, Ion: 1.13 mmol/L (ref 1.13–1.30)
Chloride: 102 mmol/L (ref 101–111)
Creatinine, Ser: 0.7 mg/dL (ref 0.44–1.00)
Glucose, Bld: 148 mg/dL — ABNORMAL HIGH (ref 65–99)
HCT: 33 % — ABNORMAL LOW (ref 36.0–46.0)
Hemoglobin: 11.2 g/dL — ABNORMAL LOW (ref 12.0–15.0)
Potassium: 4.1 mmol/L (ref 3.5–5.1)
Sodium: 138 mmol/L (ref 135–145)
TCO2: 21 mmol/L (ref 0–100)

## 2015-11-05 LAB — BASIC METABOLIC PANEL
Anion gap: 6 (ref 5–15)
BUN: 5 mg/dL — AB (ref 6–20)
CHLORIDE: 108 mmol/L (ref 101–111)
CO2: 24 mmol/L (ref 22–32)
Calcium: 8 mg/dL — ABNORMAL LOW (ref 8.9–10.3)
Creatinine, Ser: 0.76 mg/dL (ref 0.44–1.00)
GFR calc Af Amer: >60 mL/min (ref >60)
GFR calc non Af Amer: >60 mL/min (ref >60)
GLUCOSE: 105 mg/dL — AB (ref 65–99)
POTASSIUM: 3.8 mmol/L (ref 3.5–5.1)
SODIUM: 138 mmol/L (ref 135–145)

## 2015-11-05 LAB — MAGNESIUM
MAGNESIUM: 1.9 mg/dL (ref 1.7–2.4)
Magnesium: 1.9 mg/dL (ref 1.7–2.4)

## 2015-11-05 LAB — CREATININE, SERUM
CREATININE: 0.82 mg/dL (ref 0.44–1.00)
GFR calc Af Amer: >60 mL/min (ref >60)
GFR calc non Af Amer: >60 mL/min (ref >60)

## 2015-11-05 LAB — PREPARE RBC (CROSSMATCH)

## 2015-11-05 MED ORDER — INSULIN ASPART 100 UNIT/ML ~~LOC~~ SOLN
0.0000 [IU] | SUBCUTANEOUS | Status: DC
  Administered 2015-11-05 (×2): 2 [IU] via SUBCUTANEOUS

## 2015-11-05 MED ORDER — INSULIN DETEMIR 100 UNIT/ML ~~LOC~~ SOLN
12.0000 [IU] | Freq: Two times a day (BID) | SUBCUTANEOUS | Status: DC
  Administered 2015-11-05 – 2015-11-06 (×3): 12 [IU] via SUBCUTANEOUS
  Filled 2015-11-05 (×6): qty 0.12

## 2015-11-05 MED ORDER — PREGABALIN 75 MG PO CAPS
75.0000 mg | ORAL_CAPSULE | Freq: Two times a day (BID) | ORAL | Status: DC
  Administered 2015-11-05 – 2015-11-15 (×20): 75 mg via ORAL
  Filled 2015-11-05 (×3): qty 1
  Filled 2015-11-05: qty 3
  Filled 2015-11-05 (×4): qty 1
  Filled 2015-11-05: qty 3
  Filled 2015-11-05 (×11): qty 1

## 2015-11-05 MED ORDER — INSULIN DETEMIR 100 UNIT/ML ~~LOC~~ SOLN
12.0000 [IU] | Freq: Two times a day (BID) | SUBCUTANEOUS | Status: DC
  Filled 2015-11-05: qty 0.12

## 2015-11-05 MED ORDER — INSULIN DETEMIR 100 UNIT/ML ~~LOC~~ SOLN
10.0000 [IU] | Freq: Two times a day (BID) | SUBCUTANEOUS | Status: DC
  Administered 2015-11-05: 10 [IU] via SUBCUTANEOUS
  Filled 2015-11-05 (×2): qty 0.1

## 2015-11-05 MED ORDER — MIRTAZAPINE 15 MG PO TABS
15.0000 mg | ORAL_TABLET | Freq: Every day | ORAL | Status: DC
  Administered 2015-11-05 – 2015-11-14 (×10): 15 mg via ORAL
  Filled 2015-11-05 (×12): qty 1

## 2015-11-05 MED ORDER — FUROSEMIDE 20 MG PO TABS
20.0000 mg | ORAL_TABLET | Freq: Every day | ORAL | Status: DC
Start: 2015-11-05 — End: 2015-11-05

## 2015-11-05 MED ORDER — POTASSIUM CHLORIDE 10 MEQ/50ML IV SOLN
10.0000 meq | INTRAVENOUS | Status: AC
  Administered 2015-11-05 (×2): 10 meq via INTRAVENOUS
  Filled 2015-11-05 (×2): qty 50

## 2015-11-05 MED ORDER — DULOXETINE HCL 60 MG PO CPEP: 60.0000 mg | ORAL_CAPSULE | Freq: Every day | ORAL | Status: DC

## 2015-11-05 MED ORDER — ZOLPIDEM TARTRATE 5 MG PO TABS
5.0000 mg | ORAL_TABLET | Freq: Every evening | ORAL | Status: DC | PRN
  Administered 2015-11-14: 5 mg via ORAL
  Filled 2015-11-05: qty 1

## 2015-11-05 MED ORDER — FUROSEMIDE 10 MG/ML IJ SOLN
20.0000 mg | Freq: Every day | INTRAMUSCULAR | Status: AC
Start: 2015-11-05 — End: 2015-11-05
  Administered 2015-11-05: 20 mg via INTRAVENOUS
  Filled 2015-11-05: qty 2

## 2015-11-05 NOTE — Anesthesia Postprocedure Evaluation (Signed)
Anesthesia Post Note  Patient: Julia Schwartz  Procedure(s) Performed: Procedure(s) (LRB): STERNOTOMY (N/A) RESECTION OF LEFT ATRIAL MYXOMA, BI-ATRIAL APPROACH WITH PERICARDIAL PATCH (N/A) TRANSESOPHAGEAL ECHOCARDIOGRAM (TEE) (N/A)  Patient location during evaluation: SICU Anesthesia Type: General Level of consciousness: sedated and patient remains intubated per anesthesia plan Pain management: pain level controlled Vital Signs Assessment: post-procedure vital signs reviewed and stable Respiratory status: patient remains intubated per anesthesia plan Cardiovascular status: stable Anesthetic complications: no    Last Vitals:  Filed Vitals:   11/05/15 0830 11/05/15 0845  BP:    Pulse: 90 88  Temp: 37.5 C 37.4 C  Resp: 21 19    Last Pain:  Filed Vitals:   11/05/15 0845  PainSc: 2                  Josafat Enrico,JAMES TERRILL

## 2015-11-05 NOTE — Progress Notes (Signed)
1 Day Post-Op Procedure(s) (LRB): STERNOTOMY (N/A) RESECTION OF LEFT ATRIAL MYXOMA, BI-ATRIAL APPROACH WITH PERICARDIAL PATCH (N/A) TRANSESOPHAGEAL ECHOCARDIOGRAM (TEE) (N/A) Subjective: Resection of left atrial myxoma and pericardial patch of atrial septum Postoperative expected blood loss anemia-hemoglobin less than 7, will transfuse 2 units of packed cells with Lasix after first unit Progress with debridement of lines and mobilize out of bed Chest tube output remains too high to remove drains Maintaining sinus rhythm  Objective: Vital signs in last 24 hours: Temp:  [95 F (35 C)-100.2 F (37.9 C)] 99.7 F (37.6 C) (01/17 0700) Pulse Rate:  [87-125] 89 (01/17 0700) Cardiac Rhythm:  [-] Normal sinus rhythm (01/16 2000) Resp:  [12-31] 21 (01/17 0700) BP: (95-126)/(61-106) 95/64 mmHg (01/17 0700) SpO2:  [95 %-100 %] 97 % (01/17 0700) Arterial Line BP: (91-142)/(51-86) 101/52 mmHg (01/17 0700) FiO2 (%):  [40 %-50 %] 40 % (01/16 1818) Weight:  [170 lb 3.1 oz (77.2 kg)] 170 lb 3.1 oz (77.2 kg) (01/17 0500)  Hemodynamic parameters for last 24 hours: PAP: (18-38)/(8-21) 26/12 mmHg CO:  [2.8 L/min-4.5 L/min] 4.2 L/min CI:  [1.6 L/min/m2-2.6 L/min/m2] 2.4 L/min/m2  Intake/Output from previous day: 01/16 0701 - 01/17 0700 In: 4585 [I.V.:2545; Blood:460; NG/GT:30; IV Piggyback:1550] Out: 2810 [Urine:2160; Chest Tube:650] Intake/Output this shift:      Lab Results:  Recent Labs  11/04/15 1900 11/05/15 0300  WBC 7.9 8.1  HGB 7.9* 6.8*  HCT 24.8* 20.4*  PLT 129* 118*   BMET:  Recent Labs  11/04/15 1847 11/05/15 0300  NA 139 138  K 3.7 3.8  CL 107 108  CO2  --  24  GLUCOSE 132* 105*  BUN 6 5*  CREATININE 0.70 0.76  CALCIUM  --  8.0*    PT/INR:  Recent Labs  11/04/15 1527  LABPROT 19.1*  INR 1.61*   ABG    Component Value Date/Time   PHART 7.395 11/04/2015 1958   HCO3 21.0 11/04/2015 1958   TCO2 22 11/04/2015 1958   ACIDBASEDEF 4.0* 11/04/2015 1958   O2SAT 97.0 11/04/2015 1958   CBG (last 3)   Recent Labs  11/05/15 0511 11/05/15 0559 11/05/15 0700  GLUCAP 99 104* 113*    Assessment/Plan: S/P Procedure(s) (LRB): STERNOTOMY (N/A) RESECTION OF LEFT ATRIAL MYXOMA, BI-ATRIAL APPROACH WITH PERICARDIAL PATCH (N/A) TRANSESOPHAGEAL ECHOCARDIOGRAM (TEE) (N/A) Mobilize Diuresis Diabetes control Transfuse for expected postoperative blood loss anemia   LOS: 1 day    [name] 11/05/2015

## 2015-11-05 NOTE — Procedures (Signed)
Extubation Procedure Note  Patient Details:   Name: Yaitza Lor DOB: [birth year] MRN: [id number]   Airway Documentation:     Evaluation  O2 sats: stable throughout Complications: No apparent complications Patient did tolerate procedure well. Bilateral Breath Sounds: Clear, Diminished   Yes   Pt. Was extubated to a 4L [place] without any complications, dyspnea or stridor noted. Pt. Achieved a goal of 1L on VC & -20 on NIF. Pt. Was instructed on IS, highest goal achieved was 270mL with very poor effort.   Claretta Fraise 11/05/2015, 6:45 PM

## 2015-11-05 NOTE — Progress Notes (Signed)
Patient ID: Allean Schwiebert, female   DOB: [birth year], 65 y.o.   MRN: [id number]  Hemodynamically stable in sinus rhythm Vasopressors off.  Diuresed well today  BMET    Component Value Date/Time   NA 138 11/05/2015 1600   K 4.1 11/05/2015 1600   CL 102 11/05/2015 1600   CO2 24 11/05/2015 0300   GLUCOSE 148* 11/05/2015 1600   BUN 5* 11/05/2015 1600   CREATININE 0.70 11/05/2015 1600   CALCIUM 8.0* 11/05/2015 0300   GFRNONAA >60 11/05/2015 1550   GFRAA >60 11/05/2015 1550    CBC    Component Value Date/Time   WBC 11.5* 11/05/2015 1550   RBC 3.43* 11/05/2015 1550   HGB 11.2* 11/05/2015 1600   HCT 33.0* 11/05/2015 1600   PLT 99* 11/05/2015 1550   MCV 87.5 11/05/2015 1550   MCH 29.4 11/05/2015 1550   MCHC 33.7 11/05/2015 1550   RDW 14.2 11/05/2015 1550   LYMPHSABS 3.3 10/17/2015 1606   MONOABS 0.7 10/17/2015 1606   EOSABS 0.2 10/17/2015 1606   BASOSABS 0.0 10/17/2015 1606   A/P: stable day. Continue current plan.

## 2015-11-06 ENCOUNTER — Inpatient Hospital Stay (HOSPITAL_COMMUNITY): Payer: Medicare Other

## 2015-11-06 LAB — TYPE AND SCREEN
ABO/RH(D): O POS
Antibody Screen: NEGATIVE
Unit division: 0
Unit division: 0

## 2015-11-06 LAB — GLUCOSE, CAPILLARY
Glucose-Capillary: 100 mg/dL — ABNORMAL HIGH (ref 65–99)
Glucose-Capillary: 98 mg/dL (ref 65–99)
Glucose-Capillary: 118 mg/dL — ABNORMAL HIGH (ref 65–99)
Glucose-Capillary: 95 mg/dL (ref 65–99)
Glucose-Capillary: 91 mg/dL (ref 65–99)

## 2015-11-06 LAB — CBC
HCT: 29 % — ABNORMAL LOW (ref 36.0–46.0)
Hemoglobin: 9.9 g/dL — ABNORMAL LOW (ref 12.0–15.0)
MCH: 30.1 pg (ref 26.0–34.0)
MCHC: 34.1 g/dL (ref 30.0–36.0)
MCV: 88.1 fL (ref 78.0–100.0)
Platelets: 104 10*3/uL — ABNORMAL LOW (ref 150–400)
RBC: 3.29 MIL/uL — ABNORMAL LOW (ref 3.87–5.11)
RDW: 14.8 % (ref 11.5–15.5)
WBC: 12.2 10*3/uL — ABNORMAL HIGH (ref 4.0–10.5)

## 2015-11-06 LAB — BASIC METABOLIC PANEL WITH GFR
Anion gap: 6 (ref 5–15)
BUN: 6 mg/dL (ref 6–20)
CO2: 26 mmol/L (ref 22–32)
Calcium: 8.6 mg/dL — ABNORMAL LOW (ref 8.9–10.3)
Chloride: 106 mmol/L (ref 101–111)
Creatinine, Ser: 0.81 mg/dL (ref 0.44–1.00)
GFR calc Af Amer: >60 mL/min
GFR calc non Af Amer: >60 mL/min
Glucose, Bld: 119 mg/dL — ABNORMAL HIGH (ref 65–99)
Potassium: 4.3 mmol/L (ref 3.5–5.1)
Sodium: 138 mmol/L (ref 135–145)

## 2015-11-06 MED ORDER — INSULIN ASPART 100 UNIT/ML ~~LOC~~ SOLN: 0.0000 [IU] | Freq: Three times a day (TID) | SUBCUTANEOUS | Status: DC

## 2015-11-06 MED ORDER — ALUM & MAG HYDROXIDE-SIMETH 200-200-20 MG/5ML PO SUSP
15.0000 mL | ORAL | Status: DC | PRN
Start: 2015-11-06 — End: 2015-11-15

## 2015-11-06 MED ORDER — MAGNESIUM HYDROXIDE 400 MG/5ML PO SUSP: 30.0000 mL | Freq: Every day | ORAL | Status: DC | PRN

## 2015-11-06 MED ORDER — WARFARIN - PHYSICIAN DOSING INPATIENT: Freq: Every day | Status: DC

## 2015-11-06 MED ORDER — SODIUM CHLORIDE 0.9 % IJ SOLN: 3.0000 mL | INTRAMUSCULAR | Status: DC | PRN

## 2015-11-06 MED ORDER — MOVING RIGHT ALONG BOOK
Freq: Once | Status: AC
  Administered 2015-11-06: 09:00:00
  Filled 2015-11-06: qty 1

## 2015-11-06 MED ORDER — FUROSEMIDE 40 MG PO TABS
40.0000 mg | ORAL_TABLET | Freq: Every day | ORAL | Status: DC
Start: 2015-11-07 — End: 2015-11-12
  Administered 2015-11-07 – 2015-11-11 (×5): 40 mg via ORAL
  Filled 2015-11-06 (×5): qty 1

## 2015-11-06 MED ORDER — WARFARIN SODIUM 2.5 MG PO TABS
2.5000 mg | ORAL_TABLET | Freq: Once | ORAL | Status: AC
Start: 2015-11-06 — End: 2015-11-06
  Administered 2015-11-06: 2.5 mg via ORAL
  Filled 2015-11-06: qty 1

## 2015-11-06 MED ORDER — SODIUM CHLORIDE 0.9 % IV SOLN: 250.0000 mL | INTRAVENOUS | Status: DC | PRN

## 2015-11-06 MED ORDER — SODIUM CHLORIDE 0.9 % IJ SOLN
3.0000 mL | Freq: Two times a day (BID) | INTRAMUSCULAR | Status: DC
  Administered 2015-11-06 – 2015-11-14 (×9): 3 mL via INTRAVENOUS

## 2015-11-06 MED ORDER — FUROSEMIDE 10 MG/ML IJ SOLN
20.0000 mg | Freq: Every day | INTRAMUSCULAR | Status: AC
  Administered 2015-11-06: 20 mg via INTRAVENOUS
  Filled 2015-11-06: qty 2

## 2015-11-06 NOTE — Progress Notes (Signed)
2 Days Post-Op Procedure(s) (LRB): STERNOTOMY (N/A) RESECTION OF LEFT ATRIAL MYXOMA, BI-ATRIAL APPROACH WITH PERICARDIAL PATCH (N/A) TRANSESOPHAGEAL ECHOCARDIOGRAM (TEE) (N/A) Subjective: Stable after resection of left atrial myxoma Preoperative stroke with right-sided weakness from myxoma source Maintained sinus rhythm Fluid overload and receiving diuretic therapy Ready to transfer to stepdown We'll start Coumadin for 6 weeks postop  Objective: Vital signs in last 24 hours: Temp:  [98.3 F (36.8 C)-99.6 F (37.6 C)] 99 F (37.2 C) (01/18 0824) Pulse Rate:  [78-91] 86 (01/18 0800) Cardiac Rhythm:  [-] Normal sinus rhythm (01/18 0800) Resp:  [11-32] 14 (01/18 0800) BP: (94-147)/(64-97) 121/73 mmHg (01/18 0800) SpO2:  [91 %-100 %] 98 % (01/18 0800) Arterial Line BP: (101-142)/(50-78) 139/77 mmHg (01/17 1700) Weight:  [171 lb 15.3 oz (78 kg)] 171 lb 15.3 oz (78 kg) (01/18 0443)  Hemodynamic parameters for last 24 hours: PAP: (22-32)/(6-17) 32/15 mmHg  Intake/Output from previous day: 01/17 0701 - 01/18 0700 In: 1754.1 [P.O.:60; I.V.:665.1; Blood:829; IV Piggyback:200] Out: [id number] [Urine:2805; Chest Tube:440] Intake/Output this shift: Total I/O In: 20 [I.V.:20] Out: 90 [Urine:60; Chest Tube:30]  Sitting up in chair, comfortable Extremities warm Mild right-sided weakness Heart rhythm regular without murmur  Lab Results:  Recent Labs  11/05/15 1550 11/05/15 1600 11/06/15 0400  WBC 11.5*  --  12.2*  HGB 10.1* 11.2* 9.9*  HCT 30.0* 33.0* 29.0*  PLT 99*  --  104*   BMET:  Recent Labs  11/05/15 0300  11/05/15 1600 11/06/15 0400  NA 138  --  138 138  K 3.8  --  4.1 4.3  CL 108  --  102 106  CO2 24  --   --  26  GLUCOSE 105*  --  148* 119*  BUN 5*  --  5* 6  CREATININE 0.76  < > 0.70 0.81  CALCIUM 8.0*  --   --  8.6*  < > = values in this interval not displayed.  PT/INR:  Recent Labs  11/04/15 1527  LABPROT 19.1*  INR 1.61*   ABG    Component Value  Date/Time   PHART 7.395 11/04/2015 1958   HCO3 21.0 11/04/2015 1958   TCO2 21 11/05/2015 1600   ACIDBASEDEF 4.0* 11/04/2015 1958   O2SAT 97.0 11/04/2015 1958   CBG (last 3)   Recent Labs  11/05/15 2050 11/05/15 2338 11/06/15 0353  GLUCAP 123* 120* 100*    Assessment/Plan: S/P Procedure(s) (LRB): STERNOTOMY (N/A) RESECTION OF LEFT ATRIAL MYXOMA, BI-ATRIAL APPROACH WITH PERICARDIAL PATCH (N/A) TRANSESOPHAGEAL ECHOCARDIOGRAM (TEE) (N/A) Mobilize Diuresis Diabetes control Plan for transfer to step-down: see transfer orders Start Coumadin INR goal 2.0-2.5  LOS: 2 days    [name] 11/06/2015

## 2015-11-06 NOTE — Significant Event (Signed)
Patient transferred safely to 2W25, taken via wheelchair. Patient ambulated around unit prior to be transfer. No personal belonging at bedside of 2S to take to her new room. VS stable prior and during the transfer. Family [name]) made aware of the transfer and room number. Report given to receiving RN in 2W. Staff in room prior to RN leaving. Mouhamed Glassco, Therapist, sports.

## 2015-11-06 NOTE — Clinical Documentation Improvement (Signed)
Cardiothoracic  #1 of 2:  Abnormal Lab/Test Results:    Component      Potassium  Latest Ref Rng      3.5 - 5.1 mmol/L  11/04/2015     7:55 AM 3.0 (L)  11/04/2015     9:01 AM 2.6 (LL)  11/04/2015     9:20 AM 2.8 (L)  11/04/2015     10:21 AM 2.7 (LL)  11/04/2015     11:36 AM 2.8 (L)  11/04/2015     1:01 PM 3.2 (L)  11/04/2015     6:47 PM 3.7  11/05/2015     3:00 AM 3.8  11/05/2015     4:00 PM 4.1  11/06/2015      4.3    Possible Clinical Conditions associated with above abnormal labs:  Hypokalemia  Other Condition  Cannot Clinically Determine  Treatment Provided: Potassium chloride 10 mEq IV given 11/04/15.  #2 of 2: Abnormal Lab: Component      Platelets  Latest Ref Rng      150 - 400 K/uL  11/04/2015     10:20 AM 155  11/04/2015     1:00 PM 142 (L)  11/04/2015     7:00 PM 129 (L)  11/05/2015     3:00 AM 118 (L)  11/05/2015     3:50 PM 99 (L)  11/06/2015      104 (L)   Possible Clinical Conditions associated with above abnormal labs: -Thrombocytopenia -Other Condition -Unable to determine at present  Please exercise your independent, professional judgment when responding. A specific answer is not anticipated or expected.Please update your documentation within the medical record to reflect your response to this query.  Thank you, Mateo Flow, RN [phone] Clinical Documentation Specialist

## 2015-11-06 NOTE — Evaluation (Signed)
Physical Therapy Evaluation Patient Details Name: Julia Schwartz MRN: [id number] DOB: November 22, 1950 Today's Date: 11/06/2015   History of Present Illness  pt is a 65 y/o female with h/o HTN, fibromyalgia, stroke and Hep C, admitted with myxoma, s/p resection via sternotomy.  Clinical Impression  Pt admitted with/for resection of myxoma.  Pt currently limited functionally due to the problems listed below.  (see problems list.)  Pt will benefit from PT to maximize function and safety to be able to get home safely with available assist of family .     Follow Up Recommendations Outpatient PT    Equipment Recommendations  None recommended by PT    Recommendations for Other Services       Precautions / Restrictions Precautions Precautions: Fall Restrictions Weight Bearing Restrictions: Yes (Sternal precautions)      Mobility  Bed Mobility Overal bed mobility: Needs Assistance Bed Mobility: Supine to Sit     Supine to sit: Mod assist     General bed mobility comments: HOB elevated, with truncal assist due to sternal precautions.'  Transfers Overall transfer level: Needs assistance   Transfers: Sit to/from Stand Sit to Stand: Min assist         General transfer comment: reinforced hand placement based on sternal precautions  Ambulation/Gait Ambulation/Gait assistance: Min guard Ambulation Distance (Feet): 80 Feet Assistive device: Rolling walker (2 wheeled) Gait Pattern/deviations: Step-through pattern     General Gait Details: slow to direct herself or walker away from obstacles.  Slow, mildly paretic gait, moving R LE as if an afterthought at times.  Stairs            Wheelchair Mobility    Modified Rankin (Stroke Patients Only)       Balance Overall balance assessment: Needs assistance   Sitting balance-Leahy Scale: Good       Standing balance-Leahy Scale: Fair                               Pertinent Vitals/Pain Pain  Assessment: No/denies pain    Home Living Family/patient expects to be discharged to:: Private residence Living Arrangements: Children (son and daughter) Available Help at Discharge: Family;Available 24 hours/day Type of Home: House Home Access: Stairs to enter Entrance Stairs-Rails: Psychiatric nurse of Steps: 3 Home Layout: Two level;Able to live on main level with bedroom/bathroom Home Equipment: Grab bars - toilet;Grab bars - tub/shower      Prior Function Level of Independence: Independent               Hand Dominance   Dominant Hand: Right    Extremity/Trunk Assessment   Upper Extremity Assessment: Defer to OT evaluation (mildl inattension to R side with mildly decreased coord.)           Lower Extremity Assessment: Generalized weakness (mild coordination and inattension issues)         Communication   Communication:  (some expressive difficulties that did not affect understandi)  Cognition Arousal/Alertness: Awake/alert Behavior During Therapy: WFL for tasks assessed/performed Overall Cognitive Status: Within Functional Limits for tasks assessed         Following Commands: Follows one step commands with increased time   Awareness: Emergent Problem Solving: Slow processing      General Comments General comments (skin integrity, edema, etc.): VSS throughout session    Exercises        Assessment/Plan    PT Assessment Patient needs continued PT services  PT Diagnosis Abnormality of gait;Generalized weakness;Acute pain   PT Problem List Decreased strength;Decreased activity tolerance;Decreased balance;Decreased mobility;Decreased coordination;Decreased knowledge of use of DME;Pain  PT Treatment Interventions DME instruction;Gait training;Stair training;Functional mobility training;Therapeutic activities;Therapeutic exercise;Balance training;Patient/family education   PT Goals (Current goals can be found in the Care Plan  section) Acute Rehab PT Goals Patient Stated Goal: to go home PT Goal Formulation: With patient Time For Goal Achievement: 11/20/15 Potential to Achieve Goals: Good    Frequency Min 3X/week   Barriers to discharge        Co-evaluation               End of Session   Activity Tolerance: Patient tolerated treatment well Patient left: with call bell/phone within reach;with family/visitor present;with bed alarm set (in w/c ready for transport) Nurse Communication: Mobility status         Time: [id number] PT Time Calculation (min) (ACUTE ONLY): 34 min   Charges:   PT Evaluation $PT Eval Moderate Complexity: 1 Procedure PT Treatments $Gait Training: 8-22 mins   PT G Codes:        Vela Render, Tessie Fass 11/06/2015, 11:07 AM 11/06/2015  Donnella Sham, PT [phone]  (pager)

## 2015-11-06 NOTE — Progress Notes (Signed)
[facility]  Patient Status: Active (receiving services up to time of hospitalization)  [facility] is providing the following services: PT and OT  If patient discharges after hours, please call [phone].   Julia Schwartz 11/06/2015, 10:35 AM

## 2015-11-06 NOTE — Op Note (Signed)
NAME:  Julia Schwartz, Julia Schwartz                ACCOUNT NO.:  MEDICAL RECORD NO.:  [id number]  LOCATION:                                 FACILITY:  PHYSICIAN:  Ala Dach, M.D.DATE OF BIRTH:  October 22, 1950  DATE OF PROCEDURE: DATE OF DISCHARGE:                              OPERATIVE REPORT   Intraoperative Transesophageal Echocardiographic Report  INDICATION FOR PROCEDURE:  Ms. Hitchings is a female patient of Dr. Tharon Aquas Trigt who presents for excision of left atrial mass.  She presents today in the holding area for routine placement of pulmonary artery and radial arterial lines.  She has been taking to the OR for routine induction of general anesthesia, after which the TEE probe was prepared, then passed oropharyngeally into the stomach for imaging of the cardiac structures.  PRECARDIOPULMONARY EXAMINATION:  Left Ventricle:  The left ventricular chamber is seen in the short axis view.  There is concentric left ventricular hypertrophy appreciated.  There is overall good contractile pattern noted.  EF is approximately 50%.  There are no masses noted within the chamber itself.  Papillary muscles are well outlined.  Mitral Valve:  The mitral valve seen initially in the four-chamber view. It is thin, compliant, mobile.  Both coaptation points during systolic ejection are appropriate.  No diastolic inflow issues are noted.  There is no prolapse.  There was trivial mitral regurgitant flow on color Doppler.  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  It is measured by 2.3 cm in width and 1.73 cm or so in height.  It is a fairly mobile mass, appears to have a small stalk.  It has ground-glass appearance.  This is well visualized, attached to the lower part of the interatrial septum.  We could not appreciate any PFO or shunts noted in this time.  The rest of the left atrial chamber was  clear.  Aortic Valve:  The aortic valve was seen in the short axis view.  It does appear mildly enlarged.  Aortic valve diameter at the level of the aortic valve is 4.13 cm.  However, it is trileaflet, thin, compliant, and mobile.  There is no obstruction to flow.  No aortic stenosis and no aortic insufficiency appreciated.  Right Ventricle:  This is a normally contractile right ventricular chamber.  Tricuspid Valve:  Thin, compliant, mobile associated with just trivial regurgitant flow.  Right atrial chamber is interrogated and is intact.  Pulmonary artery catheter is noted within.  Color Doppler interrogation of the interatrial septum at this level reveals no appreciative shunt flow.  The patient was placed on cardiopulmonary bypass.  The left atrial chamber was incised and the mass was visualized and excised by Dr. Tharon Aquas Trigt.  During this period,  a pericardial patch repair was placed in the interatrial septal level.  POST CARDIOPULMONARY BYPASS TEE EXAMINATION:  Left Ventricle:  Left ventricular chamber is seen in the short-axis view.  The chamber itself appears small and slightly distended, dyssynergic in the septal area in the early bypass.  By replacement improved this.  Aortic Valve:  No changes appreciated here.  Mitral Valve:  Still remained thin, compliant, mobile.  Trivial mitral regurgitant flow remained.  Left Atrium:  The left atrial chamber is now interrogated closely and the area below the interatrial septum area, we could see the reflections of the pericardial patch repair.  The mass itself was entirely removed. There were no other appreciative images of any masses noted within the left atrial chamber.  This appears to be a satisfactory excision of the mass and repair of the interatrial septum.  Close color Doppler along the interatrial septum did not reveal any PFO or any shunts appreciated in the post bypass.  The rest of the cardiac examination was  as previously described and the patient has returned to the cardiac intensive care unit in stable condition.          ______________________________ Ala Dach, M.D.     JTM/MEDQ  D:  11/06/2015  T:  11/06/2015  Job:  [id number]

## 2015-11-06 NOTE — Progress Notes (Signed)
CARDIAC REHAB PHASE I   PRE:  Rate/Rhythm: 89 SR  BP:  Supine:   Sitting: 106/78  Standing:    SaO2: 96%RA  MODE:  Ambulation: 150 ft   POST:  Rate/Rhythm: 106 ST  BP:  Supine:   Sitting: 120/84  Standing:    SaO2: 93%RA 1419-1452 Pt walked 150 ft on RA with gait belt use, rolling walker and asst x2 with slow pace. Right sided weakness. Sleepy. Back to bed at pt's request. Encouraged third walk with staff later.   Graylon Good, RN BSN  11/06/2015 2:49 PM

## 2015-11-07 ENCOUNTER — Inpatient Hospital Stay (HOSPITAL_COMMUNITY): Payer: Medicare Other

## 2015-11-07 LAB — GLUCOSE, CAPILLARY: Glucose-Capillary: 75 mg/dL (ref 65–99)

## 2015-11-07 LAB — BASIC METABOLIC PANEL
Anion gap: 9 (ref 5–15)
BUN: 9 mg/dL (ref 6–20)
CO2: 25 mmol/L (ref 22–32)
Calcium: 8.7 mg/dL — ABNORMAL LOW (ref 8.9–10.3)
Chloride: 105 mmol/L (ref 101–111)
Creatinine, Ser: 0.8 mg/dL (ref 0.44–1.00)
GFR calc Af Amer: >60 mL/min (ref >60)
GFR calc non Af Amer: >60 mL/min (ref >60)
Glucose, Bld: 88 mg/dL (ref 65–99)
Potassium: 3.9 mmol/L (ref 3.5–5.1)
Sodium: 139 mmol/L (ref 135–145)

## 2015-11-07 LAB — CBC
HCT: 29.6 % — ABNORMAL LOW (ref 36.0–46.0)
Hemoglobin: 9.7 g/dL — ABNORMAL LOW (ref 12.0–15.0)
MCH: 29 pg (ref 26.0–34.0)
MCHC: 32.8 g/dL (ref 30.0–36.0)
MCV: 88.4 fL (ref 78.0–100.0)
Platelets: 136 10*3/uL — ABNORMAL LOW (ref 150–400)
RBC: 3.35 MIL/uL — ABNORMAL LOW (ref 3.87–5.11)
RDW: 14.6 % (ref 11.5–15.5)
WBC: 10 10*3/uL (ref 4.0–10.5)

## 2015-11-07 LAB — PROTIME-INR
INR: 1.03 (ref 0.00–1.49)
Prothrombin Time: 13.7 seconds (ref 11.6–15.2)

## 2015-11-07 MED ORDER — WARFARIN VIDEO: Freq: Once | Status: DC

## 2015-11-07 MED ORDER — METOPROLOL TARTRATE 25 MG PO TABS
25.0000 mg | ORAL_TABLET | Freq: Two times a day (BID) | ORAL | Status: DC
  Administered 2015-11-07 – 2015-11-10 (×8): 25 mg via ORAL
  Filled 2015-11-07 (×8): qty 1

## 2015-11-07 MED ORDER — POTASSIUM CHLORIDE CRYS ER 20 MEQ PO TBCR
20.0000 meq | EXTENDED_RELEASE_TABLET | Freq: Every day | ORAL | Status: DC
  Administered 2015-11-08 – 2015-11-11 (×4): 20 meq via ORAL
  Filled 2015-11-07 (×4): qty 1

## 2015-11-07 MED ORDER — WARFARIN SODIUM 5 MG PO TABS
5.0000 mg | ORAL_TABLET | Freq: Once | ORAL | Status: AC
  Administered 2015-11-07: 5 mg via ORAL
  Filled 2015-11-07: qty 1

## 2015-11-07 MED ORDER — ASPIRIN EC 81 MG PO TBEC
81.0000 mg | DELAYED_RELEASE_TABLET | Freq: Every day | ORAL | Status: DC
  Administered 2015-11-07 – 2015-11-15 (×9): 81 mg via ORAL
  Filled 2015-11-07 (×9): qty 1

## 2015-11-07 MED ORDER — COUMADIN BOOK
Freq: Once | Status: AC
  Administered 2015-11-07: 18:00:00
  Filled 2015-11-07: qty 1

## 2015-11-07 MED ORDER — POTASSIUM CHLORIDE CRYS ER 20 MEQ PO TBCR
30.0000 meq | EXTENDED_RELEASE_TABLET | Freq: Once | ORAL | Status: AC
  Administered 2015-11-07: 30 meq via ORAL
  Filled 2015-11-07: qty 1

## 2015-11-07 NOTE — Progress Notes (Signed)
Utilization review completed.  

## 2015-11-07 NOTE — Progress Notes (Addendum)
      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, BI-ATRIAL APPROACH WITH PERICARDIAL PATCH (N/A) TRANSESOPHAGEAL ECHOCARDIOGRAM (TEE) (N/A)  Subjective: Patient with incisional pain this am  Objective: Vital signs in last 24 hours: Temp:  [98.9 F (37.2 C)-99.5 F (37.5 C)] 99.5 F (37.5 C) (01/19 0313) Pulse Rate:  [87-93] 92 (01/19 0313) Cardiac Rhythm:  [-] Normal sinus rhythm (01/18 2015) Resp:  [17-22] 20 (01/19 0313) BP: (106-137)/(71-88) 120/74 mmHg (01/19 0313) SpO2:  [92 %-99 %] 96 % (01/19 0313) Weight:  [173 lb 1 oz (78.5 kg)] 173 lb 1 oz (78.5 kg) (01/19 0600)  Pre op weight 72 kg Current Weight  11/07/15 173 lb 1 oz (78.5 kg)      Intake/Output from previous day: 01/18 0701 - 01/19 0700 In: 140 [P.O.:120; I.V.:20] Out: 1900 [Urine:1860; Chest Tube:40]   Physical Exam:  Cardiovascular: RRR Pulmonary: Diminished at bases bilaterally; no rales, wheezes, or rhonchi. Abdomen: Soft, non tender, bowel sounds present. Extremities: Mild bilateral lower extremity edema. Wounds: Aquacel dressing intact Neurologic: Weakness right side  Lab Results: CBC: Recent Labs  11/06/15 0400 11/07/15 0320  WBC 12.2* 10.0  HGB 9.9* 9.7*  HCT 29.0* 29.6*  PLT 104* 136*   BMET:  Recent Labs  11/06/15 0400 11/07/15 0320  NA 138 139  K 4.3 3.9  CL 106 105  CO2 26 25  GLUCOSE 119* 88  BUN 6 9  CREATININE 0.81 0.80  CALCIUM 8.6* 8.7*    PT/INR:  Lab Results  Component Value Date   INR 1.03 11/07/2015   INR 1.61* 11/04/2015   INR 1.03 10/31/2015   ABG:  INR: Will add last result for INR, ABG once components are confirmed Will add last 4 CBG results once components are confirmed  Assessment/Plan:  1. CV - On Lopressor 12.5 mg bid and Coumadin. INR 1.03. Will increase Lopressor to 25 mg bid for better HR control. Will  decrease ecasa to 81 mg as on Couamdin. 2.  Pulmonary - On room air. CXR shows no pneumothorax, cardiomegaly, bibasilar atelectasis. Encourage incentive spirometer. 3. Volume Overload - On Lasix 40 mg daily 4.  Acute blood loss anemia - H and H stable at 9.7 and 29.6 5.Neuro-pre op stroke with right sided weakness 6. Supplement potassium 7. Thrombocytopenia-platelets up to 136,000 8. CBGs 95/91/75. Pre op HGA1C 5.9. She is likely pre diabetic. Will stop accu checks and SS PRN. Will need further surveillance of HGA1C with medical doctor after discharge.  9. Remove EPW in am 10. Per Dr. Prescott Gum, CIR consult to be ordered  Schwartz,Julia MPA-C 11/07/2015,8:35 AM  CIR recommends [facility] PT and nurse- face to face completed Home INR draws to coumadin clinic  patient examined and medical record reviewed,agree with above note. [name] 11/07/2015

## 2015-11-07 NOTE — Progress Notes (Signed)
CARDIAC REHAB PHASE I   Pt up to bathroom, preparing to get to chair. Pt adamantly refusing to walk at this time. When asked why, pt states "I just don't want to." RN notified. Will reattempt this afternoon.   Lenna Sciara, RN, BSN 11/07/2015 11:20 AM

## 2015-11-07 NOTE — Care Management Note (Signed)
Case Management Note Marvetta Gibbons RN, BSN Unit 2W-Case Manager [phone]  Patient Details  Name: Julia Schwartz MRN: [id number] Date of Birth: Feb 28, 1951  Subjective/Objective:   Pt admitted s/p Atrial myxoma resection                 Action/Plan: PTA pt lived at home with family- was active with [facility] for HH-PT/OT- will need resumption orders for [facility] if pt returns home with family- NCM to follow pt progression- PT not recommending inpt rehab/SNF at this time.   Expected Discharge Date:                  Expected Discharge Plan:  [place]  In-House Referral:     Discharge planning Services  CM Consult  Post Acute Care Choice:  Home Health, Resumption of Svcs/PTA Provider Choice offered to:     DME Arranged:    DME Agency:     HH Arranged:  PT, OT HH Agency:  [facility]  Status of Service:  In process, will continue to follow  Medicare Important Message Given:    Date Medicare IM Given:    Medicare IM give by:    Date Additional Medicare IM Given:    Additional Medicare Important Message give by:     If discussed at [place] of Stay Meetings, dates discussed:    Additional Comments:  Dawayne Patricia, RN 11/07/2015, 10:54 AM

## 2015-11-07 NOTE — Progress Notes (Signed)
Thank you for consult on Ms. [place]. PT recommending [place] therapy after discharge. Will defer CIR consult for now.

## 2015-11-07 NOTE — Progress Notes (Signed)
CARDIAC REHAB PHASE I   PRE:  Rate/Rhythm: 91 SR    BP: sitting 98/60    SaO2: 98 RA  MODE:  Ambulation: 150 ft   POST:  Rate/Rhythm: 112 ST    BP: sitting 124/65     SaO2: 96 RA  Pt reluctant to get out of bed. Sts she just got in bed and has only slept 1 1/2 hrs today. Encouraged her to walk and she did. Max assist turning and sitting up on EOB due to right sided weakness, pts position in bed and lack of effort. Able to stand independently. Needed assist to steer RW, had a tendency to veer left with RW. ? If pt was aware. Return to bed. Will continue to follow. Encouraged at least x1 more walk this pm. Pt seems depressed. [id number]   Josephina Shih [place] CES, ACSM 11/07/2015 2:59 PM

## 2015-11-08 LAB — PROTIME-INR
INR: 1.11 (ref 0.00–1.49)
PROTHROMBIN TIME: 14.5 s (ref 11.6–15.2)

## 2015-11-08 MED ORDER — WARFARIN SODIUM 5 MG PO TABS
5.0000 mg | ORAL_TABLET | Freq: Once | ORAL | Status: AC
  Administered 2015-11-08: 5 mg via ORAL
  Filled 2015-11-08: qty 1

## 2015-11-08 NOTE — Progress Notes (Signed)
      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 Meulemans states she is doing okay.  She did not ambulate much yesterday per nursing and cardiac rehab.  Encouraged patient to ambulate at least 3 times per day to lower her risk of pneumonia and development of blood clot in her leg  Objective: Vital signs in last 24 hours: Temp:  [98.6 F (37 C)-99.4 F (37.4 C)] 99.4 F (37.4 C) (01/20 0342) Pulse Rate:  [88-102] 92 (01/20 0342) Cardiac Rhythm:  [-] Normal sinus rhythm (01/20 0732) Resp:  [16-20] 16 (01/20 0342) BP: (96-123)/(72-80) 123/72 mmHg (01/20 0342) SpO2:  [93 %-98 %] 93 % (01/20 0342) Weight:  [166 lb 12.8 oz (75.66 kg)] 166 lb 12.8 oz (75.66 kg) (01/20 0207)  Intake/Output from previous day: 01/19 0701 - 01/20 0700 In: 360 [P.O.:360] Out: 600 [Urine:600]  General appearance: alert, cooperative and no distress Heart: regular rate and rhythm Lungs: clear to auscultation bilaterally Abdomen: soft, non-tender; bowel sounds normal; no masses,  no organomegaly Extremities: edema trace Wound: clean and dry  Lab Results:  Recent Labs  11/06/15 0400 11/07/15 0320  WBC 12.2* 10.0  HGB 9.9* 9.7*  HCT 29.0* 29.6*  PLT 104* 136*   BMET:  Recent Labs  11/06/15 0400 11/07/15 0320  NA 138 139  K 4.3 3.9  CL 106 105  CO2 26 25  GLUCOSE 119* 88  BUN 6 9  CREATININE 0.81 0.80  CALCIUM 8.6* 8.7*    PT/INR:  Recent Labs  11/08/15 0325  LABPROT 14.5  INR 1.11   ABG    Component Value Date/Time   PHART 7.395 11/04/2015 1958   HCO3 21.0 11/04/2015 1958   TCO2 21 11/05/2015 1600   ACIDBASEDEF 4.0* 11/04/2015 1958   O2SAT 97.0 11/04/2015 1958   CBG (last 3)   Recent Labs  11/06/15 1620 11/06/15 2035 11/07/15 0626   GLUCAP 95 91 75    Assessment/Plan: S/P Procedure(s) (LRB): 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 CV- remains hemodynamically stable, continue Lopressor 2. INR 1.11, will repeat dose of 5 mg today 3. Renal- creatinine has been WNL, weight is trending down continue Lasix 4. Pulm- no acute issue, continue IS 5. Neuro- right sided weakness from previous stroke, not a candidate for CIR  6. Dispo- patient stable, will d/c EPW today, continue coumadin, home once INR is trending up   LOS: 4 days    Ahmed Prima, Susie Pousson 11/08/2015

## 2015-11-08 NOTE — Discharge Instructions (Addendum)
Supplemental Discharge Instructions for  Pacemaker/Defibrillator Patients  Activity No heavy lifting or vigorous activity with your left/right arm for 6 to 8 weeks.  Do not raise your left/right arm above your head for one week.  Gradually raise your affected arm as drawn below.           __     11/18/15                  11/19/15                        11/20/15                    11/21/15   WOUND CARE - Keep the wound area clean and dry.  Do not get this area wet for one week. No showers for one week; you may shower on   11/21/15  . - The tape/steri-strips on your wound will fall off; do not pull them off.  No bandage is needed on the site.  DO  NOT apply any creams, oils, or ointments to the wound area. - If you notice any drainage or discharge from the wound, any swelling or bruising at the site, or you develop a fever > 101? F after you are discharged home, call the office at once.  Special Instructions - You are still able to use cellular telephones; use the ear opposite the side where you have your pacemaker/defibrillator.  Avoid carrying your cellular phone near your device. - When traveling through airports, show security personnel your identification card to avoid being screened in the metal detectors.  Ask the security personnel to use the hand wand. - Avoid arc welding equipment, MRI testing (magnetic resonance imaging), TENS units (transcutaneous nerve stimulators).  Call the office for questions about other devices. - Avoid electrical appliances that are in poor condition or are not properly grounded. - Microwave ovens are safe to be near or to operate.  Atrial Myxoma Resection, Care After Refer to this sheet in the next few weeks. These instructions provide you with information on caring for yourself after your procedure. Your health care provider may also give you specific instructions. Your treatment has been planned according to current medical practices, but problems sometimes  occur. Call your health care provider if you have any problems or questions after your procedure. HOME CARE INSTRUCTIONS   Take medicines only as directed by your health care provider.  If your health care provider has prescribed elastic stockings, wear them as directed.  Take frequent naps or rest often throughout the day.  Avoid lifting over 10 lbs (4.5 kg) or pushing or pulling things with your arms for 6-8 weeks or as directed by your health care provider.  Avoid driving or airplane travel for 4-6 weeks after surgery or as directed by your health care provider. If you are riding in a car for an extended period, stop every 1-2 hours to stretch your legs. Keep a record of your medicines and medical history with you when traveling.  Do not drive or operate heavy machinery while taking pain medicine. (narcotics).  Do not cross your legs.  Do not use any tobacco products including cigarettes, chewing tobacco, or electronic cigarettes. If you need help quitting, ask your health care provider.  Do not take baths, swim, or use a hot tub until your health care provider approves. Take showers once your health care provider approves. Pat incisions dry.  Do not rub incisions with a washcloth or towel.  Avoid climbing stairs and using the handrail to pull yourself up for the first 2-3 weeks after surgery.  Return to work as directed by your health care provider.  Drink enough fluid to keep your urine clear or pale yellow.  Do not strain to have a bowel movement. Eat high-fiber foods if you become constipated. You may also take a medicine to help you have a bowel movement (laxative) as directed by your health care provider.  Resume sexual activity as directed by your health care provider. Men should not use medicines for erectile dysfunction until their doctor says it isokay.  If you had a certain type of heart condition in the past, you may need to take antibiotic medicine before having dental  work or surgery. Let your dentist and health care providers know if you had one or more of the following:  Previous endocarditis.  An artificial (prosthetic) heart valve.  Congenital heart disease. SEEK MEDICAL CARE IF:  You develop a skin rash.   You experience sudden changes in your weight.  You have a fever. SEEK IMMEDIATE MEDICAL CARE IF:   You develop chest pain that is not coming from your incision.  You have drainage (pus), redness, swelling, or pain at your incision site.   You develop shortness of breath or have difficulty breathing.   You have increased bleeding from your incision site.   You develop light-headedness.  MAKE SURE YOU:   Understand these directions.  Will watch your condition.  Will get help right away if you are not doing well or get worse.   This information is not intended to replace advice given to you by your health care provider. Make sure you discuss any questions you have with your health care provider.   Document Released: 04/23/2005 Document Revised: 10/26/2014 Document Reviewed: 07/19/2012 Elsevier Interactive Patient Education 2016 [place] on my medicine - Coumadin   (Warfarin)  This medication education was reviewed with me or my healthcare representative as part of my discharge preparation.  The pharmacist that spoke with me during my hospital stay was:  Romona Curls, [facility]  Why was Coumadin prescribed for you? Coumadin was prescribed for you because you have a blood clot or a medical condition that can cause an increased risk of forming blood clots. Blood clots can cause serious health problems by blocking the flow of blood to the heart, lung, or brain. Coumadin can prevent harmful blood clots from forming. As a reminder your indication for Coumadin is:   Blood Clot Prevention After Heart Valve Surgery  What test will check on my response to Coumadin? While on Coumadin (warfarin) you will need to have an  INR test regularly to ensure that your dose is keeping you in the desired range. The INR (international normalized ratio) number is calculated from the result of the laboratory test called prothrombin time (PT).  If an INR APPOINTMENT HAS NOT ALREADY BEEN MADE FOR YOU please schedule an appointment to have this lab work done by your health care provider within 7 days. Your INR goal is usually a number between:  2 to 3 or your provider may give you a more narrow range like 2-2.5.  Ask your health care provider during an office visit what your goal INR is.  What  do you need to  know  About  COUMADIN? Take Coumadin (warfarin) exactly as prescribed by your healthcare provider about the same  time each day.  DO NOT stop taking without talking to the doctor who prescribed the medication.  Stopping without other blood clot prevention medication to take the place of Coumadin may increase your risk of developing a new clot or stroke.  Get refills before you run out.  What do you do if you miss a dose? If you miss a dose, take it as soon as you remember on the same day then continue your regularly scheduled regimen the next day.  Do not take two doses of Coumadin at the same time.  Important Safety Information A possible side effect of Coumadin (Warfarin) is an increased risk of bleeding. You should call your healthcare provider right away if you experience any of the following: ? Bleeding from an injury or your nose that does not stop. ? Unusual colored urine (red or dark brown) or unusual colored stools (red or black). ? Unusual bruising for unknown reasons. ? A serious fall or if you hit your head (even if there is no bleeding).  Some foods or medicines interact with Coumadin (warfarin) and might alter your response to warfarin. To help avoid this: ? Eat a balanced diet, maintaining a consistent amount of Vitamin K. ? Notify your provider about major diet changes you plan to make. ? Avoid alcohol or  limit your intake to 1 drink for women and 2 drinks for men per day. (1 drink is 5 oz. wine, 12 oz. beer, or 1.5 oz. liquor.)  Make sure that ANY health care provider who prescribes medication for you knows that you are taking Coumadin (warfarin).  Also make sure the healthcare provider who is monitoring your Coumadin knows when you have started a new medication including herbals and non-prescription products.  Coumadin (Warfarin)  Major Drug Interactions  Increased Warfarin Effect Decreased Warfarin Effect  Alcohol (large quantities) Antibiotics (esp. Septra/Bactrim, Flagyl, Cipro) Amiodarone (Cordarone) Aspirin (ASA) Cimetidine (Tagamet) Megestrol (Megace) NSAIDs (ibuprofen, naproxen, etc.) Piroxicam (Feldene) Propafenone (Rythmol SR) Propranolol (Inderal) Isoniazid (INH) Posaconazole (Noxafil) Barbiturates (Phenobarbital) Carbamazepine (Tegretol) Chlordiazepoxide (Librium) Cholestyramine (Questran) Griseofulvin Oral Contraceptives Rifampin Sucralfate (Carafate) Vitamin K   Coumadin (Warfarin) Major Herbal Interactions  Increased Warfarin Effect Decreased Warfarin Effect  Garlic Ginseng Ginkgo biloba Coenzyme Q10 Green tea St. Johns wort    Coumadin (Warfarin) FOOD Interactions  Eat a consistent number of servings per week of foods HIGH in Vitamin K (1 serving =  cup)  Collards (cooked, or boiled & drained) Kale (cooked, or boiled & drained) Mustard greens (cooked, or boiled & drained) Parsley *serving size only =  cup Spinach (cooked, or boiled & drained) Swiss chard (cooked, or boiled & drained) Turnip greens (cooked, or boiled & drained)  Eat a consistent number of servings per week of foods MEDIUM-HIGH in Vitamin K (1 serving = 1 cup)  Asparagus (cooked, or boiled & drained) Broccoli (cooked, boiled & drained, or raw & chopped) Brussel sprouts (cooked, or boiled & drained) *serving size only =  cup Lettuce, raw (green leaf, endive, romaine) Spinach,  raw Turnip greens, raw & chopped   These websites have more information on Coumadin (warfarin):  FailFactory.se; VeganReport.com.au;

## 2015-11-08 NOTE — Progress Notes (Signed)
dc'ed pacing wires pt. tolerated well 

## 2015-11-08 NOTE — Progress Notes (Signed)
Physical Therapy Treatment Patient Details Name: Julia Schwartz MRN: [id number] DOB: [birth year] Today's Date: 11/08/2015    History of Present Illness pt is a 65 y/o female with h/o HTN, fibromyalgia, stroke and Hep C, admitted with myxoma, s/p resection via sternotomy.    PT Comments    Progressing well.  Showing ability to mobilize without significant use of her UE's  Follow Up Recommendations  Outpatient PT     Equipment Recommendations  None recommended by PT    Recommendations for Other Services       Precautions / Restrictions Precautions Precautions: Fall Restrictions Weight Bearing Restrictions: Yes (Sternal precautions)    Mobility  Bed Mobility Overal bed mobility: Needs Assistance Bed Mobility: Sidelying to Sit   Sidelying to sit: Min guard       General bed mobility comments: no assist to come up slowly via R elbow and scoot to EOB without UE assist.  Occasionally she forgets and pushes with left UE, Right UE acts as an afterthought.  Transfers Overall transfer level: Needs assistance   Transfers: Sit to/from Stand Sit to Stand: Supervision         General transfer comment: reinforced hand placement based on sternal precautions  Ambulation/Gait Ambulation/Gait assistance: Min guard Ambulation Distance (Feet): 400 Feet (with standing rest as needed) Assistive device: Rolling walker (2 wheeled) Gait Pattern/deviations: Step-through pattern Gait velocity: slower   General Gait Details: needed help as she fatigued to direct the RWs, but generally steady throughout.   Stairs Stairs:  (pt deferred the stairs)          Wheelchair Mobility    Modified Rankin (Stroke Patients Only) Modified Rankin (Stroke Patients Only) Modified Rankin: Moderate disability     Balance     Sitting balance-Leahy Scale: Good       Standing balance-Leahy Scale: Fair                      Cognition Arousal/Alertness: Awake/alert Behavior  During Therapy: WFL for tasks assessed/performed Overall Cognitive Status: Within Functional Limits for tasks assessed                      Exercises General Exercises - Lower Extremity Hip ABduction/ADduction: AROM;Strengthening;Both;10 reps;Standing Hip Flexion/Marching: AROM;Strengthening;Both;10 reps;Standing Toe Raises: AROM;Both;15 reps;Standing Heel Raises: AROM;Both;15 reps;Standing Mini-Sqauts: AROM;Strengthening;10 reps;Standing    General Comments        Pertinent Vitals/Pain Pain Assessment: Faces Faces Pain Scale: Hurts even more Pain Location: right chest wall pain Pain Descriptors / Indicators: Aching;Discomfort;Grimacing Pain Intervention(s): Monitored during session    Home Living                      Prior Function            PT Goals (current goals can now be found in the care plan section) Acute Rehab PT Goals Patient Stated Goal: to go home PT Goal Formulation: With patient Time For Goal Achievement: 11/20/15 Potential to Achieve Goals: Good Progress towards PT goals: Progressing toward goals    Frequency  Min 3X/week    PT Plan Current plan remains appropriate    Co-evaluation             End of Session   Activity Tolerance: Patient tolerated treatment well Patient left: with call bell/phone within reach;with family/visitor present;with bed alarm set     Time: [id number] PT Time Calculation (min) (ACUTE ONLY): 35 min  Charges:  $Gait Training: 8-22  mins $Therapeutic Exercise: 8-22 mins                    G Codes:      Kyzer Blowe, Tessie Fass 11/08/2015, 10:48 AM    11/08/2015  Donnella Sham, PT [phone]  (pager)

## 2015-11-08 NOTE — Discharge Summary (Signed)
Physician Discharge Summary  Patient ID: Julia Schwartz MRN: [id number] DOB/AGE: March 26, 1951 65 y.o.  Admit date: 11/04/2015 Discharge date: 11/15/2015  Admission Diagnoses: atrial myxoma  Patient Active Problem List   Diagnosis Date Noted  . 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 Stroke ([place])   . Tobacco abuse   . Fibromyalgia   . Hypomagnesemia   . Hypokalemia   . Myxoma of heart   . HLD (hyperlipidemia)   . Tobacco use disorder   . Embolic stroke ([place]) [id number]  . HTN (hypertension) 10/17/2015   Discharge Diagnoses:   Patient Active Problem List   Diagnosis Date Noted  . 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 Stroke ([place])   . Tobacco abuse   . Fibromyalgia   . Hypomagnesemia   . Hypokalemia   . Myxoma of heart   . HLD (hyperlipidemia)   . Tobacco use disorder   . Embolic stroke ([place]) 99991111  . HTN (hypertension) 10/17/2015  PAF  Discharged Condition: good  History of Present Illness:  Julia Schwartz is a 65 yo African American Female with history of nicotineu abuse, HTN who presented to the ED on 10/16/2016 with complaints of right sided weakness and facial droop.  Work up in the ED showed bilateral punctate infarcts.  Echocardiogram was obtained and showed a 2 x 3 cm sessile tumor in the left atrial septum consistent with Atrial Myxoma.  There were no valvular abnormalities identified.  Due to this finding it was felt surgical intervention would be required and TCTS was consulted.  She was evaluated by Dr. Prescott Gum who was in agreement the patient would require surgical resection of her Myxoma.  He recommended cardiac catheterization prior to proceeding with surgery.  The patient was agreeable to this, the risks and benefits of the procedure were explained to the patient and she was agreeable to proceed for surgery on 11/04/2015.  Patients cardiac catheterization was free from significant coronary disease.   Hospital Course:   Julia Schwartz presented to [facility] on  11/04/2015.  She was taken to the operating room and underwent Sternotomy with resection of left Atrial Myxoma.  This was done using a Bi-Atrial approach with placement of a pericardial patch.  She tolerated the procedure without difficulty and was taken to the SICU in stable condition.  The patient was extubated the evening of surgery.  During her stay in the SICU the patient was transfused 2 units of packed red blood cells due to expected blood loss anemia.  Her chest tubes and arterial lines were removed without difficulty.  She was aggressively diuresed for post operative hypervolemia.  She was started on Coumadin therapy for stroke prophylaxis post operatively with a goal INR range of 2.0-2.5.  She was felt medically stable for transfer to the step down unit on POD #2.  The patient continues to make slow progress.  She continues to have some right sided weakness from her preoperative stroke.  She participates with PT who feels she is safe to be discharged home with H/H PT.  She was experiencing mild Tachycardia and her Lopressor was titrated up accordingly.  She remains on Coumadin therapy at a dose of 5 mg per day.  Her current INR is up to 1.84.  She will need to have a PT/INR drawn on Tuesday by home health.  She did have several episodes of a fib with RVR on 01/21. She was started on Amiodarone. 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 She has  overall progressed well and is stable for d/c home today Significant Diagnostic Studies: cardiac graphics:   Echocardiogram:   PRECARDIOPULMONARY EXAMINATION: Left Ventricle: The left ventricular chamber is seen in the short axis view. There is concentric left ventricular hypertrophy appreciated. There is overall good contractile pattern noted. EF is approximately 50%. There are no masses noted within the chamber itself. Papillary muscles are well outlined.  Mitral Valve: The mitral valve seen initially in the four-chamber  view. It is thin, compliant, mobile. Both coaptation points during systolic ejection are appropriate. No diastolic inflow issues are noted. There is no prolapse. There was trivial mitral regurgitant flow on color Doppler.  Left Atrium: Further pullback now reveals a fairly significant mass in the left atrial chamber adhered to the interatrial septum. It is measured by 2.3 cm in width and 1.73 cm or so in height. It is a fairly mobile mass, appears to have a small stalk. It has ground-glass appearance. This is well visualized, attached to the lower part of the interatrial septum. We could not appreciate any PFO or shunts noted in this time. The rest of the left atrial chamber was clear.  Aortic Valve: The aortic valve was seen in the short axis view. It does appear mildly enlarged. Aortic valve diameter at the level of the aortic valve is 4.13 cm. However, it is trileaflet, thin, compliant, and mobile. There is no obstruction to flow. No aortic stenosis and no aortic insufficiency appreciated.  Right Ventricle: This is a normally contractile right ventricular chamber.  Tricuspid Valve: Thin, compliant, mobile associated with just trivial regurgitant flow.  Treatments: surgery:   1. Median sternotomy. 2. Resection of 3 cm left atrial myxoma using a biatrial approach and pericardial patch closure of the atrial septal defect by Dr. Prescott Gum on 11/04/2015.    Pacemaker placement Conclusion    Preop DX:: syncope sinus node dysfunction tachy brady Post op DX:: same   Procedure dual pacemaker implantation  After routine prep and drape, lidocaine was infiltrated in the prepectoral subclavicular region on the left side an incision was made and carried down to later the prepectoral fascia using electrocautery and sharp dissection a pocket was formed similarly. Hemostasis was obtained.  After this, we turned our attention to gaining accessm to the  extrathoracic,left subclavian vein. This was accomplished without difficulty and without the aspiration of air or puncture of the artery. 2 separate venipunctures were accomplished; guidewires were placed and retained and sequentially 7 French sheaths were placed through which were passed a Medtronic MRI compatible ventricular lead serial number [id number] and a Medtronic MRI compatible atrial lead serial number [id number] .  The ventricular lead was manipulated to the right ventricular apex with a bipolar R wave was 6.8, the pacing impedance was 908, the threshold was 1.1 @ 0.5 msec Current at threshold was 1.2 Ma and the current of injury was BRISK. I SHOULD note multiple sites were mapped in the Atrium and the ventricle to get the acceptable parameters recorded  The right atrial lead was manipulated to the right atrial FREE WALL with a bipolar P-wave 2.5, the pacing impedance was 501, the threshold 1.5@ 0.5 msec Current at threshold was 3 Ma and the current of injury was moderate.  The leads were affixed to the prepectoral fascia and attached to a Medtronic MRI compatible pulse generator serial number [id number].  Hemostasis was obtained. The pocket was copiously irrigated with antibiotic containing saline solution. The leads and the pulse generator were placed in the  pocket and affixed to the prepectoral fascia. The wound was then closed in 3 layers in the normal fashion. The wound was washed dried and a benzoin Steri-Strip dressing was applied.  Needle Count, sponge counts and instrument counts were correct at the end of the procedure .  The patient tolerated the procedure without apparent complication.  EBL Minimal   Deboraha Sprang M.D.        Disposition: Home with H/H  Discharge Medications:   Medication List    STOP taking these medications        amLODipine-benazepril 10-40 MG capsule  Commonly known as:  LOTREL     enoxaparin 100 MG/ML injection   Commonly known as:  LOVENOX     HYDROcodone-acetaminophen 10-325 MG tablet  Commonly known as:  NORCO      TAKE these medications        amiodarone 200 MG tablet  Commonly known as:  PACERONE  Take 1 tablet (200 mg total) by mouth daily.     aspirin 81 MG EC tablet  Take 1 tablet (81 mg total) by mouth daily.     atorvastatin 20 MG tablet  Commonly known as:  LIPITOR  Take 1 tablet (20 mg total) by mouth daily at 6 PM.     busPIRone 15 MG tablet  Commonly known as:  BUSPAR  Take 15 mg by mouth 3 (three) times daily.     DULoxetine 60 MG capsule  Commonly known as:  CYMBALTA  Take 60 mg by mouth daily.     furosemide 20 MG tablet  Commonly known as:  LASIX  Take 20 mg by mouth daily.     metoprolol tartrate 25 MG tablet  Commonly known as:  LOPRESSOR  Take 1 tablet (25 mg total) by mouth 2 (two) times daily.     mirtazapine 15 MG tablet  Commonly known as:  REMERON  Take 15 mg by mouth at bedtime.     omeprazole 40 MG capsule  Commonly known as:  PRILOSEC  Take 1 capsule (40 mg total) by mouth daily.     oxyCODONE 5 MG immediate release tablet  Commonly known as:  Oxy IR/ROXICODONE  Take 1 tablet (5 mg total) by mouth every 4 (four) hours as needed for severe pain.     pregabalin 75 MG capsule  Commonly known as:  LYRICA  Take 75-150 mg by mouth 2 (two) times daily. 150mg  in the morning and 75mg  in the evening     tiZANidine 4 MG capsule  Commonly known as:  ZANAFLEX  Take 4 mg by mouth 3 (three) times daily as needed for muscle spasms.     warfarin 5 MG tablet  Commonly known as:  COUMADIN  Take 1 tablet (5 mg total) by mouth daily.     zolpidem 10 MG tablet  Commonly known as:  AMBIEN  Take 10 mg by mouth at bedtime.        The patient has been discharged on:   1.Beta Blocker:  Yes [ x  ]                              No   [   ]                              If No, reason:  2.Ace Inhibitor/ARB: Yes [   ]  No   [ x   ]                                     If No, reason: no CAD, labile BP  3.Statin:   Yes [ x  ]                  No  [   ]                  If No, reason:  4.Ecasa:  Yes  [ x  ]                  No   [   ]                  If No, reason:  Follow-up Information    Follow up with [name], MD On 12/04/2015.   Specialty:  Cardiothoracic Surgery   Why:  Appointment is at 2:30   Contact information:   [address] [place] [address] [phone]       Follow up with  [facility] On 12/04/2015.   Why:  Please get CXR at 2:00, located on first floor of [place] information:   [facility]       Follow up with [facility].   Why:  HH-RN/PT arranged. Please draw PT and INR on 11/18/15 and call or fax results to Dr. Rosezella [place] office   Contact information:   [address] [place] [address] [phone]       Follow up with Richardson Dopp, PA-C On 11/21/2015.   Specialties:  Physician Assistant, Radiology, Interventional Cardiology   Why:  Appointment time is at 9:50 am   Contact information:   [address] [place] [address] [phone]       Follow up with Xu,Jindong, MD On 01/08/2016.   Specialty:  Neurology   Why:  Appointment time is at 1:00 pm   Contact information:   [address] [place] [address] [phone]       Follow up with [facility] On 11/21/2015.   Specialty:  Cardiology   Why:  at 10:30 for pacemaker wound check    Contact information:   [address] [place]      Signed: Odis Luster 11/15/2015, 7:57 AM

## 2015-11-08 NOTE — Care Management Important Message (Signed)
Important Message  Patient Details  Name: Julia Schwartz MRN: [id number] Date of Birth: 01-12-1951   Medicare Important Message Given:  Yes    Dawayne Patricia, RN 11/08/2015, 2:18 PM

## 2015-11-08 NOTE — Care Management Note (Signed)
Case Management Note Marvetta Gibbons RN, BSN Unit 2W-Case Manager [phone]  Patient Details  Name: Julia Schwartz MRN: [id number] Date of Birth: 10/24/1950  Subjective/Objective:   Pt admitted s/p Atrial myxoma resection                 Action/Plan: PTA pt lived at home with family- was active with [facility] for HH-PT/OT- will need resumption orders for [facility] if pt returns home with family- NCM to follow pt progression- PT not recommending inpt rehab/SNF at this time.   Expected Discharge Date:                  Expected Discharge Plan:  [place]  In-House Referral:     Discharge planning Services  CM Consult  Post Acute Care Choice:  Home Health, Resumption of Svcs/PTA Provider Choice offered to:     DME Arranged:    DME Agency:     HH Arranged:  PT, OT, RN [place] Agency:  [place]  Status of Service:  In process, will continue to follow  Medicare Important Message Given:    Date Medicare IM Given:    Medicare IM give by:    Date Additional Medicare IM Given:    Additional Medicare Important Message give by:     If discussed at [place] of Stay Meetings, dates discussed:    Discharge Disposition: Home/ Home Health   Additional Comments:  11/08/15- Plan for pt to return home with [facility]- orders placed- spoke with Joellen Jersey at [facility] regarding resumption orders- possible d/c over weekend when INR therapeutic. [facility] to follow for [facility].   Dawayne Patricia, RN 11/08/2015, 11:04 AM

## 2015-11-08 NOTE — Progress Notes (Signed)
CARDIAC REHAB PHASE I   PRE:  Rate/Rhythm: 92 SR  BP:  Supine: 103/76  Sitting:   Standing:    SaO2: 92%RA  MODE:  Ambulation: 150 ft   POST:  Rate/Rhythm: 110 ST  BP:  Supine:   Sitting: 120/81  Standing:    SaO2: 97%RA 1435-1500 Pt walked 150 ft with rolling walker with fairly steady gait. Gripped better with right hand today. Cut walk short as watching a movie. Back to bed.    Graylon Good, RN BSN  11/08/2015 2:56 PM

## 2015-11-09 LAB — PROTIME-INR
INR: 1.39 (ref 0.00–1.49)
Prothrombin Time: 17.2 seconds — ABNORMAL HIGH (ref 11.6–15.2)

## 2015-11-09 MED ORDER — OXYCODONE HCL 5 MG PO TABS: 5.0000 mg | ORAL_TABLET | ORAL | Status: DC | PRN

## 2015-11-09 MED ORDER — WARFARIN SODIUM 5 MG PO TABS
5.0000 mg | ORAL_TABLET | Freq: Every day | ORAL | Status: DC
  Filled 2015-11-09: qty 1

## 2015-11-09 MED ORDER — LEVALBUTEROL HCL 1.25 MG/0.5ML IN NEBU: 1.2500 mg | INHALATION_SOLUTION | Freq: Four times a day (QID) | RESPIRATORY_TRACT | Status: DC | PRN

## 2015-11-09 MED ORDER — WARFARIN SODIUM 5 MG PO TABS: 5.0000 mg | ORAL_TABLET | Freq: Every day | ORAL | Status: DC

## 2015-11-09 MED ORDER — METOPROLOL TARTRATE 25 MG PO TABS: 25.0000 mg | ORAL_TABLET | Freq: Two times a day (BID) | ORAL | Status: DC

## 2015-11-09 MED ORDER — ASPIRIN 81 MG PO TBEC: 81.0000 mg | DELAYED_RELEASE_TABLET | Freq: Every day | ORAL | Status: DC

## 2015-11-09 NOTE — Progress Notes (Addendum)
CARDIAC REHAB PHASE I   PRE:  Rate/Rhythm: 94 SR  BP:  Supine:   Sitting: 113/88  Standing:    SaO2: 97 RA  MODE:  Ambulation: 350 ft   POST:  Rate/Rhythm: 180 Afib  BP:  Supine:   Sitting: 110/85  Standing:    SaO2: 98 RA 1045-1140 Assisted X 1 and used walker to ambulate. Gait steady with walker, pt c/o of feeling really tired walking and sweating. Returned pt to her room, HR on mointer 180's A fib. Pt to recliner, with 2-3 minutes HR returned to 90's SR. Completed discharge education with pt. She voices understanding. Did not referral pt to Outpt. CRP due to Medicare does not cover cost after Myxoma surgery. [name] RN in room when we returned from walk and he was aware of increase HR. Placed post op surgery video on for her to watch. She wants a walker for home use reported to RN. Pt states that she no longer plans to smoke. I gave her information on cessation.  Rodney Langton RN 11/09/2015 11:39 AM

## 2015-11-09 NOTE — Progress Notes (Addendum)
      [address]       [place] [address]             [phone]        5 Days Post-Op Procedure(s) (LRB): STERNOTOMY (N/A) RESECTION OF LEFT ATRIAL MYXOMA, BI-ATRIAL APPROACH WITH PERICARDIAL PATCH (N/A) TRANSESOPHAGEAL ECHOCARDIOGRAM (TEE) (N/A)  Subjective: Patient hoping to go home today  Objective: Vital signs in last 24 hours: Temp:  [98.6 F (37 C)-99.6 F (37.6 C)] 98.6 F (37 C) (01/21 0424) Pulse Rate:  [89-93] 92 (01/21 0424) Cardiac Rhythm:  [-] Normal sinus rhythm (01/21 0700) Resp:  [18] 18 (01/20 2030) BP: (114-127)/(75-82) 127/82 mmHg (01/21 0424) SpO2:  [98 %-100 %] 99 % (01/21 0424) Weight:  [161 lb 11.2 oz (73.347 kg)] 161 lb 11.2 oz (73.347 kg) (01/21 0448)  Pre op weight 72 kg Current Weight  11/09/15 161 lb 11.2 oz (73.347 kg)      Intake/Output from previous day: 01/20 0701 - 01/21 0700 In: 600 [P.O.:600] Out: 1150 [Urine:1150]   Physical Exam:  Cardiovascular: RRR Pulmonary: Slightly diminished at bases bilaterally; no rales, wheezes, or rhonchi. Abdomen: Soft, non tender, bowel sounds present. Extremities: Mild bilateral lower extremity edema. Wounds: Clean and dry Neurologic: Weakness right side  Lab Results: CBC:  Recent Labs  11/07/15 0320  WBC 10.0  HGB 9.7*  HCT 29.6*  PLT 136*   BMET:   Recent Labs  11/07/15 0320  NA 139  K 3.9  CL 105  CO2 25  GLUCOSE 88  BUN 9  CREATININE 0.80  CALCIUM 8.7*    PT/INR:  Lab Results  Component Value Date   INR 1.39 11/09/2015   INR 1.11 11/08/2015   INR 1.03 11/07/2015   ABG:  INR: Will add last result for INR, ABG once components are confirmed Will add last 4 CBG results once components are confirmed  Assessment/Plan:  1. CV - On Lopressor 25 mg bid and Coumadin. INR increased from 1.11 to 1.39. Continue with Coumadin 5 mg daily. 2.  Pulmonary - On room air. Encourage incentive spirometer. 3. Volume Overload - On Lasix 40 mg daily 4.   Acute blood loss anemia - H and H stable at 9.7 and 29.6 5.Neuro-pre op stroke with right sided weakness 6. Thrombocytopenia-last platelets up to 136,000 7. Hope to discharge in am if INR continues to increase  ZIMMERMAN,DONIELLE MPA-C 11/09/2015,9:09 AM  Patient seen and examined, agree with above Doing well Hopefully home in AM  Palmer C. Roxan Hockey, MD [facility] [phone]

## 2015-11-10 LAB — PROTIME-INR
INR: 1.73 — AB (ref 0.00–1.49)
PROTHROMBIN TIME: 20.2 s — AB (ref 11.6–15.2)

## 2015-11-10 MED ORDER — WARFARIN SODIUM 2.5 MG PO TABS
2.5000 mg | ORAL_TABLET | Freq: Every day | ORAL | Status: DC
  Administered 2015-11-10 – 2015-11-12 (×3): 2.5 mg via ORAL
  Filled 2015-11-10 (×3): qty 1

## 2015-11-10 MED ORDER — METOPROLOL TARTRATE 25 MG PO TABS: 25.0000 mg | ORAL_TABLET | Freq: Two times a day (BID) | ORAL | Status: DC

## 2015-11-10 MED ORDER — OXYCODONE HCL 5 MG PO TABS: 5.0000 mg | ORAL_TABLET | ORAL | Status: DC | PRN

## 2015-11-10 MED ORDER — AMIODARONE HCL 200 MG PO TABS
400.0000 mg | ORAL_TABLET | Freq: Two times a day (BID) | ORAL | Status: DC
  Administered 2015-11-10 – 2015-11-11 (×4): 400 mg via ORAL
  Filled 2015-11-10 (×4): qty 2

## 2015-11-10 NOTE — Progress Notes (Signed)
Pt walked 300 ft on room air tolerated well

## 2015-11-10 NOTE — Progress Notes (Addendum)
      [address]       [place], [address]             [phone]        6 Days Post-Op Procedure(s) (LRB): STERNOTOMY (N/A) RESECTION OF LEFT ATRIAL MYXOMA, BI-ATRIAL APPROACH WITH PERICARDIAL PATCH (N/A) TRANSESOPHAGEAL ECHOCARDIOGRAM (TEE) (N/A)  Subjective: Patient feeling fairly well. No specific complaints  Objective: Vital signs in last 24 hours: Temp:  [97.9 F (36.6 C)-98.7 F (37.1 C)] 98.7 F (37.1 C) (01/22 0657) Pulse Rate:  [77-96] 96 (01/22 0657) Cardiac Rhythm:  [-] Normal sinus rhythm (01/21 1927) Resp:  [19] 19 (01/22 0657) BP: (97-129)/(71-93) 129/93 mmHg (01/22 0657) SpO2:  [97 %-100 %] 97 % (01/22 0657) Weight:  [157 lb 11.2 oz (71.532 kg)] 157 lb 11.2 oz (71.532 kg) (01/22 0657)  Pre op weight 72 kg Current Weight  11/10/15 157 lb 11.2 oz (71.532 kg)      Intake/Output from previous day: 01/21 0701 - 01/22 0700 In: 240 [P.O.:240] Out: -    Physical Exam:  Cardiovascular: RRR Pulmonary: Slightly diminished at bases bilaterally; no rales, wheezes, or rhonchi. Abdomen: Soft, non tender, bowel sounds present. Extremities: Trace bilateral lower extremity edema. Wounds: Clean and dry Neurologic: Weakness right side  Lab Results: CBC: No results for input(s): WBC, HGB, HCT, PLT in the last 72 hours. BMET:  No results for input(s): NA, K, CL, CO2, GLUCOSE, BUN, CREATININE, CALCIUM in the last 72 hours.  PT/INR:  Lab Results  Component Value Date   INR 1.73* 11/10/2015   INR 1.39 11/09/2015   INR 1.11 11/08/2015   ABG:  INR: Will add last result for INR, ABG once components are confirmed Will add last 4 CBG results once components are confirmed  Assessment/Plan:  1. CV - Had several episodes of a fib with RVR yesterday (HR into the 160's+).On Lopressor 25 mg bid and Coumadin. Will start Amiodarone. INR increased from 1.39 to 1.73. Will give Coumadin 2.5 tonight as started Amiodarone. 2.  Pulmonary - On room air.  Encourage incentive spirometer. 3. Volume Overload - On Lasix 40 mg daily 4.  Acute blood loss anemia - H and H stable at 9.7 and 29.6 5.Neuro-pre op stroke with right sided weakness 6. Thrombocytopenia-last platelets up to 136,000 7. Will monitor on tele; possible discharge in am  ZIMMERMAN,DONIELLE MPA-C 11/10/2015,8:23 AM    Patient seen and examined, agree with above  Remo Lipps C. Roxan Hockey, MD [facility] [phone]

## 2015-11-10 NOTE — Progress Notes (Signed)
Pt. Refused to walk this am

## 2015-11-11 LAB — PROTIME-INR
INR: 1.93 — ABNORMAL HIGH (ref 0.00–1.49)
Prothrombin Time: 21.9 seconds — ABNORMAL HIGH (ref 11.6–15.2)

## 2015-11-11 MED ORDER — POTASSIUM CHLORIDE CRYS ER 20 MEQ PO TBCR
20.0000 meq | EXTENDED_RELEASE_TABLET | Freq: Two times a day (BID) | ORAL | Status: DC
Start: 2015-11-11 — End: 2015-11-15
  Administered 2015-11-11 – 2015-11-15 (×8): 20 meq via ORAL
  Filled 2015-11-11 (×8): qty 1

## 2015-11-11 MED ORDER — METOPROLOL TARTRATE 12.5 MG HALF TABLET
12.5000 mg | ORAL_TABLET | Freq: Two times a day (BID) | ORAL | Status: DC
  Administered 2015-11-11 – 2015-11-12 (×2): 12.5 mg via ORAL
  Filled 2015-11-11 (×2): qty 1

## 2015-11-11 NOTE — Progress Notes (Signed)
CARDIAC REHAB PHASE I   PRE:  Rate/Rhythm: 49 SB to 124 ST    BP: sitting 105/88    SaO2: 98 RA  MODE:  Ambulation: 350 ft   POST:  Rate/Rhythm: 110s mostly with PACs, 202 max rate at end    BP: sitting 119/86     SaO2: 98 RA  Pt sts she feels well. Able to get up independently and walk with RW. No trouble steering RW. Pt HR fluctuating between 49 and 124 ST in room. Pt asymptomatic. C/o fatigue toward end of walk, HR up to 190s for a few seconds. Rested and returned to 110s. To recliner, pt just c/o fatigue. Encouraged pt to walk x2 more today. She sts she is only walking x1 more. Pt in joking mood despite not for d/c. [id number]   Josephina Shih [place] CES, ACSM 11/11/2015 11:09 AM

## 2015-11-11 NOTE — Care Management Important Message (Signed)
Important Message  Patient Details  Name: Madlene Hulslander MRN: [id number] Date of Birth: [birth year]   Medicare Important Message Given:  Yes    Dawayne Patricia, RN 11/11/2015, 4:17 PM

## 2015-11-11 NOTE — Progress Notes (Addendum)
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, BI-ATRIAL APPROACH WITH PERICARDIAL PATCH (N/A) TRANSESOPHAGEAL ECHOCARDIOGRAM (TEE) (N/A) Subjective: Feels pretty well overall  Objective: Vital signs in last 24 hours: Temp:  [97.3 F (36.3 C)-98.5 F (36.9 C)] 97.3 F (36.3 C) (01/23 0505) Pulse Rate:  [46-73] 46 (01/23 0505) Cardiac Rhythm:  [-] Supraventricular tachycardia;Other (Comment) (01/22 2028) Resp:  [18] 18 (01/23 0505) BP: (116-121)/(66-83) 121/77 mmHg (01/23 0505) SpO2:  [97 %-99 %] 97 % (01/23 0505) Weight:  [155 lb 3.2 oz (70.398 kg)] 155 lb 3.2 oz (70.398 kg) (01/23 0505)  Hemodynamic parameters for last 24 hours:    Intake/Output from previous day: 01/22 0701 - 01/23 0700 In: 600 [P.O.:600] Out: 200 [Urine:200] Intake/Output this shift:    General appearance: alert, cooperative and no distress Heart: regular rate and rhythm, no rub and no murmur Lungs: clear to auscultation bilaterally Abdomen: benign Extremities: no edema Wound: incis healing well  Lab Results: No results for input(s): WBC, HGB, HCT, PLT in the last 72 hours. BMET: No results for input(s): NA, K, CL, CO2, GLUCOSE, BUN, CREATININE, CALCIUM in the last 72 hours.  PT/INR:  Recent Labs  11/11/15 0233  LABPROT 21.9*  INR 1.93*   ABG    Component Value Date/Time   PHART 7.395 11/04/2015 1958   HCO3 21.0 11/04/2015 1958   TCO2 21 11/05/2015 1600   ACIDBASEDEF 4.0* 11/04/2015 1958   O2SAT 97.0 11/04/2015 1958   CBG (last 3)  No results for input(s): GLUCAP in the last 72 hours.  Meds Scheduled Meds: . amiodarone  400 mg Oral BID  . antiseptic oral rinse  7 mL Mouth Rinse q12n4p  . aspirin EC  81 mg Oral Daily  . atorvastatin  20 mg Oral q1800  . bisacodyl  10 mg Oral Daily   Or  . bisacodyl  10 mg Rectal Daily  . budesonide-formoterol  2  puff Inhalation BID  . busPIRone  15 mg Oral TID  . chlorhexidine  15 mL Mouth Rinse BID  . docusate sodium  200 mg Oral Daily  . DULoxetine  60 mg Oral Daily  . furosemide  40 mg Oral Daily  . metoprolol tartrate  25 mg Oral BID  . mirtazapine  15 mg Oral QHS  . pantoprazole  40 mg Oral Daily  . potassium chloride  20 mEq Oral Daily  . pregabalin  75 mg Oral BID  . sodium chloride  3 mL Intravenous Q12H  . sodium chloride  3 mL Intravenous Q12H  . warfarin  2.5 mg Oral q1800  . warfarin   Does not apply Once  . Warfarin - Physician Dosing Inpatient   Does not apply q1800   Continuous Infusions:  PRN Meds:.sodium chloride, alum & mag hydroxide-simeth, levalbuterol, magnesium hydroxide, metoprolol, ondansetron (ZOFRAN) IV, oxyCODONE, sodium chloride, zolpidem  Xrays No results found.  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 [name] 11/11/2015

## 2015-11-11 NOTE — Progress Notes (Signed)
Utilization review completed.  

## 2015-11-11 NOTE — Progress Notes (Signed)
Physical Therapy Treatment Patient Details Name: Amayiah Carbary MRN: [id number] DOB: May 03, 1951 Today's Date: 11/11/2015    History of Present Illness pt is a 65 y/o female with h/o HTN, fibromyalgia, stroke and Hep C, admitted with myxoma, s/p resection via sternotomy.    PT Comments    Progressing well with mobility.  Deferred the stairs again but didn't push the issue due to labile HR from 90's to 180's.  Follow Up Recommendations  Outpatient PT     Equipment Recommendations  None recommended by PT    Recommendations for Other Services       Precautions / Restrictions      Mobility  Bed Mobility                  Transfers Overall transfer level: Needs assistance   Transfers: Sit to/from Stand Sit to Stand: Modified independent (Device/Increase time)         General transfer comment: reinforced hand placement  Ambulation/Gait Ambulation/Gait assistance: Supervision;Modified independent (Device/Increase time) (mod I in room) Ambulation Distance (Feet): 250 Feet Assistive device: None Gait Pattern/deviations: Step-through pattern Gait velocity: slower   General Gait Details: generally steady, able to scan, turn abruptly or arround while ambulating.  EhR labile and up to 187 bpm so ceased when this happened   Stairs            Wheelchair Mobility    Modified Rankin (Stroke Patients Only)       Balance     Sitting balance-Leahy Scale: Good       Standing balance-Leahy Scale: Good                      Cognition Arousal/Alertness: Awake/alert Behavior During Therapy: WFL for tasks assessed/performed Overall Cognitive Status: Within Functional Limits for tasks assessed                      Exercises      General Comments        Pertinent Vitals/Pain Pain Assessment: No/denies pain    Home Living                      Prior Function            PT Goals (current goals can now be found in the  care plan section) Acute Rehab PT Goals PT Goal Formulation: With patient Time For Goal Achievement: 11/20/15 Potential to Achieve Goals: Good Progress towards PT goals: Progressing toward goals    Frequency  Min 3X/week    PT Plan Current plan remains appropriate    Co-evaluation             End of Session   Activity Tolerance: Patient tolerated treatment well Patient left: with call bell/phone within reach;with family/visitor present;with bed alarm set     Time: [id number] PT Time Calculation (min) (ACUTE ONLY): 21 min  Charges:  $Gait Training: 8-22 mins                    G Codes:      Amanii Snethen, Tessie Fass 11/11/2015, 4:29 PM 11/11/2015  Donnella Sham, PT [phone]  (pager)

## 2015-11-12 LAB — PROTIME-INR
INR: 1.94 — AB (ref 0.00–1.49)
PROTHROMBIN TIME: 22 s — AB (ref 11.6–15.2)

## 2015-11-12 LAB — BASIC METABOLIC PANEL
Anion gap: 8 (ref 5–15)
BUN: 15 mg/dL (ref 6–20)
CO2: 27 mmol/L (ref 22–32)
Calcium: 9 mg/dL (ref 8.9–10.3)
Chloride: 104 mmol/L (ref 101–111)
Creatinine, Ser: 1.04 mg/dL — ABNORMAL HIGH (ref 0.44–1.00)
GFR calc Af Amer: >60 mL/min (ref >60)
GFR calc non Af Amer: 56 mL/min — ABNORMAL LOW (ref >60)
Glucose, Bld: 109 mg/dL — ABNORMAL HIGH (ref 65–99)
Potassium: 4.2 mmol/L (ref 3.5–5.1)
Sodium: 139 mmol/L (ref 135–145)

## 2015-11-12 LAB — HEPATIC FUNCTION PANEL
ALT: 24 U/L (ref 14–54)
AST: 33 U/L (ref 15–41)
Albumin: 3.2 g/dL — ABNORMAL LOW (ref 3.5–5.0)
Alkaline Phosphatase: 50 U/L (ref 38–126)
BILIRUBIN DIRECT: 0.1 mg/dL (ref 0.1–0.5)
Indirect Bilirubin: 0.6 mg/dL (ref 0.3–0.9)
Total Bilirubin: 0.7 mg/dL (ref 0.3–1.2)
Total Protein: 6.4 g/dL — ABNORMAL LOW (ref 6.5–8.1)

## 2015-11-12 MED ORDER — AMIODARONE HCL 200 MG PO TABS
200.0000 mg | ORAL_TABLET | Freq: Two times a day (BID) | ORAL | Status: DC
  Administered 2015-11-12 – 2015-11-14 (×6): 200 mg via ORAL
  Filled 2015-11-12 (×6): qty 1

## 2015-11-12 MED ORDER — CARBAMIDE PEROXIDE 6.5 % OT SOLN
5.0000 [drp] | Freq: Two times a day (BID) | OTIC | Status: DC
  Administered 2015-11-12 – 2015-11-14 (×5): 5 [drp] via OTIC
  Filled 2015-11-12: qty 15

## 2015-11-12 MED ORDER — NEOMYCIN-POLYMYXIN-HC 3.5-10000-1 OT SUSP
3.0000 [drp] | Freq: Three times a day (TID) | OTIC | Status: DC
  Administered 2015-11-12 – 2015-11-14 (×6): 3 [drp] via OTIC
  Filled 2015-11-12: qty 10

## 2015-11-12 MED ORDER — FUROSEMIDE 20 MG PO TABS
20.0000 mg | ORAL_TABLET | Freq: Every day | ORAL | Status: DC
  Administered 2015-11-12 – 2015-11-15 (×4): 20 mg via ORAL
  Filled 2015-11-12 (×4): qty 1

## 2015-11-12 MED ORDER — NEOMYCIN-COLIST-HC-THONZONIUM 3.3-3-10-0.5 MG/ML OT SUSP
3.0000 [drp] | Freq: Three times a day (TID) | OTIC | Status: DC
  Filled 2015-11-12: qty 5

## 2015-11-12 MED ORDER — NEOMYCIN-POLYMYXIN-HC 1 % OT SOLN
3.0000 [drp] | Freq: Three times a day (TID) | OTIC | Status: DC
  Filled 2015-11-12: qty 10

## 2015-11-12 NOTE — Progress Notes (Addendum)
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, BI-ATRIAL APPROACH WITH PERICARDIAL PATCH (N/A) TRANSESOPHAGEAL ECHOCARDIOGRAM (TEE) (N/A) Subjective: Feels pretty well overall, stll brady at times to low 40's  Objective: Vital signs in last 24 hours: Temp:  [98.3 F (36.8 C)-98.8 F (37.1 C)] 98.3 F (36.8 C) (01/24 0644) Pulse Rate:  [73-81] 81 (01/24 0644) Cardiac Rhythm:  [-] Normal sinus rhythm (01/24 0242) Resp:  [18] 18 (01/24 0644) BP: (105-126)/(57-93) 126/93 mmHg (01/24 0644) SpO2:  [96 %-100 %] 100 % (01/24 0644) Weight:  [154 lb 1.6 oz (69.899 kg)] 154 lb 1.6 oz (69.899 kg) (01/24 0644)  Hemodynamic parameters for last 24 hours:    Intake/Output from previous day: 01/23 0701 - 01/24 0700 In: 600 [P.O.:600] Out: -  Intake/Output this shift:    General appearance: alert, cooperative and no distress Heart: irregularly irregular rhythm Lungs: clear to auscultation bilaterally Abdomen: benign Extremities: no edema Wound: incis healing well  Lab Results: No results for input(s): WBC, HGB, HCT, PLT in the last 72 hours. BMET:  Recent Labs  11/12/15 0329  NA 139  K 4.2  CL 104  CO2 27  GLUCOSE 109*  BUN 15  CREATININE 1.04*  CALCIUM 9.0    PT/INR:  Recent Labs  11/12/15 0329  LABPROT 22.0*  INR 1.94*   ABG    Component Value Date/Time   PHART 7.395 11/04/2015 1958   HCO3 21.0 11/04/2015 1958   TCO2 21 11/05/2015 1600   ACIDBASEDEF 4.0* 11/04/2015 1958   O2SAT 97.0 11/04/2015 1958   CBG (last 3)  No results for input(s): GLUCAP in the last 72 hours.  Meds Scheduled Meds: . amiodarone  400 mg Oral BID  . antiseptic oral rinse  7 mL Mouth Rinse q12n4p  . aspirin EC  81 mg Oral Daily  . atorvastatin  20 mg Oral q1800  . bisacodyl  10 mg Oral Daily   Or  . bisacodyl  10 mg Rectal Daily  . budesonide-formoterol  2 puff Inhalation BID  . busPIRone  15 mg Oral TID  . chlorhexidine  15 mL Mouth Rinse BID   . docusate sodium  200 mg Oral Daily  . DULoxetine  60 mg Oral Daily  . furosemide  40 mg Oral Daily  . metoprolol tartrate  12.5 mg Oral BID  . mirtazapine  15 mg Oral QHS  . pantoprazole  40 mg Oral Daily  . potassium chloride  20 mEq Oral BID  . pregabalin  75 mg Oral BID  . sodium chloride  3 mL Intravenous Q12H  . sodium chloride  3 mL Intravenous Q12H  . warfarin  2.5 mg Oral q1800  . warfarin   Does not apply Once  . Warfarin - Physician Dosing Inpatient   Does not apply q1800   Continuous Infusions:  PRN Meds:.sodium chloride, alum & mag hydroxide-simeth, levalbuterol, magnesium hydroxide, metoprolol, ondansetron (ZOFRAN) IV, oxyCODONE, sodium chloride, zolpidem  Xrays No results found.  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 Cont coumadin. With h/o hepatitis will check LFT's 2 cont rehab   LOS: 8 days    Julia Schwartz,Julia Schwartz 11/12/2015  Leave patient on stepdown while cardiac meds are being adjusted for postop arrhythmia Potassium now normal Reduce lasix to 20 mg daily for one more day patient examined  and medical record reviewed,agree with above note. Julia Schwartz 11/12/2015

## 2015-11-12 NOTE — Progress Notes (Signed)
CARDIAC REHAB PHASE I   PRE:  Rate/Rhythm: 44 SB  BP:  Supine:   Sitting: 111/67  Standing:    SaO2: 97%RA  MODE:  Ambulation: 350 ft   POST:  Rate/Rhythm: 70 to 94 SR hall but a burst to 138- 140 when sitting after walk  BP:  Supine:   Sitting: 100/65  Standing:    SaO2: 100%RA 1048-1115 Pt walked 350 ft on RA with hand held asst with fairly steady gait. Monitored heart rate during walk and had burst at very end of walk. Pt tolerated well. To recliner with call bell.   Graylon Good, RN BSN  11/12/2015 11:09 AM

## 2015-11-12 NOTE — Progress Notes (Signed)
Physical Therapy Treatment Patient Details Name: Eran Windish MRN: [id number] DOB: [birth year] Today's Date: 11/12/2015    History of Present Illness pt is a 65 y/o female with h/o HTN, fibromyalgia, stroke and Hep C, admitted with myxoma, s/p resection via sternotomy.    PT Comments    Completed the stairs today.  Has generally met all goals for supervision.  Will work toward [employer] I from this point.  Follow Up Recommendations  Outpatient PT     Equipment Recommendations  None recommended by PT    Recommendations for Other Services       Precautions / Restrictions Precautions Precautions: Fall    Mobility  Bed Mobility Overal bed mobility: Needs Assistance Bed Mobility: Sidelying to Sit Rolling: Supervision Sidelying to sit: Supervision       General bed mobility comments: no assist to come up slowly via R elbow and scoot to EOB without UE assist.    Transfers Overall transfer level: Needs assistance   Transfers: Sit to/from Stand Sit to Stand: Modified independent (Device/Increase time)            Ambulation/Gait Ambulation/Gait assistance: Supervision Ambulation Distance (Feet): 400 Feet Assistive device: None Gait Pattern/deviations: Step-through pattern Gait velocity: age appropriate Gait velocity interpretation: at or above normal speed for age/gender General Gait Details: steady and at a functional speed to get out in the neighborhood.   Stairs Stairs: Yes Stairs assistance: Supervision Stair Management: One rail Right;Alternating pattern;Forwards Number of Stairs: 5 General stair comments: safe with rail  Wheelchair Mobility    Modified Rankin (Stroke Patients Only) Modified Rankin (Stroke Patients Only) Modified Rankin: Moderate disability     Balance Overall balance assessment: Needs assistance   Sitting balance-Leahy Scale: Good       Standing balance-Leahy Scale: Good                      Cognition  Arousal/Alertness: Awake/alert Behavior During Therapy: WFL for tasks assessed/performed Overall Cognitive Status: Within Functional Limits for tasks assessed                      Exercises      General Comments        Pertinent Vitals/Pain Pain Assessment: No/denies pain    Home Living                      Prior Function            PT Goals (current goals can now be found in the care plan section) Acute Rehab PT Goals Patient Stated Goal: to go home PT Goal Formulation: With patient Time For Goal Achievement: 11/20/15 Potential to Achieve Goals: Good Progress towards PT goals: Progressing toward goals    Frequency  Min 3X/week    PT Plan Current plan remains appropriate    Co-evaluation             End of Session   Activity Tolerance: Patient tolerated treatment well Patient left: in bed;with call bell/phone within reach (sitting EOB)     Time: 6226-3335 PT Time Calculation (min) (ACUTE ONLY): 16 min  Charges:  $Gait Training: 8-22 mins                    G Codes:      Jaxsen Bernhart, Tessie Fass 11/12/2015, 4:59 PM 11/12/2015  Donnella Sham, [place] [phone]  (pager)

## 2015-11-13 DIAGNOSIS — I480 Paroxysmal atrial fibrillation: Secondary | ICD-10-CM

## 2015-11-13 DIAGNOSIS — R55 Syncope and collapse: Secondary | ICD-10-CM

## 2015-11-13 DIAGNOSIS — I495 Sick sinus syndrome: Secondary | ICD-10-CM

## 2015-11-13 LAB — PROTIME-INR
INR: 1.87 — AB (ref 0.00–1.49)
Prothrombin Time: 21.5 seconds — ABNORMAL HIGH (ref 11.6–15.2)

## 2015-11-13 MED ORDER — SODIUM CHLORIDE 0.9 % IV SOLN
INTRAVENOUS | Status: DC
  Administered 2015-11-14: 06:00:00 via INTRAVENOUS

## 2015-11-13 MED ORDER — GENTAMICIN SULFATE 40 MG/ML IJ SOLN
80.0000 mg | INTRAMUSCULAR | Status: DC
  Filled 2015-11-13: qty 2

## 2015-11-13 MED ORDER — CHLORHEXIDINE GLUCONATE 4 % EX LIQD
60.0000 mL | Freq: Once | CUTANEOUS | Status: AC
  Administered 2015-11-14: 4 via TOPICAL
  Filled 2015-11-13: qty 60

## 2015-11-13 MED ORDER — CHLORHEXIDINE GLUCONATE 4 % EX LIQD
60.0000 mL | Freq: Once | CUTANEOUS | Status: AC
  Administered 2015-11-13: 4 via TOPICAL
  Filled 2015-11-13: qty 60

## 2015-11-13 MED ORDER — CEFAZOLIN SODIUM-DEXTROSE 2-3 GM-% IV SOLR: 2.0000 g | INTRAVENOUS | Status: DC

## 2015-11-13 MED ORDER — WARFARIN SODIUM 4 MG PO TABS
4.0000 mg | ORAL_TABLET | Freq: Every day | ORAL | Status: DC
  Administered 2015-11-13 – 2015-11-14 (×2): 4 mg via ORAL
  Filled 2015-11-13 (×2): qty 1

## 2015-11-13 NOTE — Progress Notes (Signed)
CCMD notified that heart rate was 150-160's. It lasted 2-3 minutes then went back down to high 40's and low 50's. Patient is asymptomatic. Will continue to monitor.

## 2015-11-13 NOTE — Progress Notes (Addendum)
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, BI-ATRIAL APPROACH WITH PERICARDIAL PATCH (N/A) TRANSESOPHAGEAL ECHOCARDIOGRAM (TEE) (N/A) Subjective: Heart rate remains variable low 40's at times, frequent pac's, a bit tachy at times  Objective: Vital signs in last 24 hours: Temp:  [97.9 F (36.6 C)-98.4 F (36.9 C)] 98.4 F (36.9 C) (01/25 0600) Pulse Rate:  [48-50] 48 (01/25 0600) Cardiac Rhythm:  [-] Sinus tachycardia (01/24 2157) Resp:  [18] 18 (01/25 0600) BP: (130-137)/(50-72) 130/50 mmHg (01/25 0600) SpO2:  [99 %-100 %] 99 % (01/25 0600) Weight:  [156 lb 8 oz (70.988 kg)] 156 lb 8 oz (70.988 kg) (01/25 0600)  Hemodynamic parameters for last 24 hours:    Intake/Output from previous day: 01/24 0701 - 01/25 0700 In: 600 [P.O.:600] Out: -  Intake/Output this shift:    General appearance: alert, cooperative and no distress Heart: irregularly irregular rhythm Lungs: clear to auscultation bilaterally Abdomen: benign Extremities: no edemq Wound: incis healing well  Lab Results: No results for input(s): WBC, HGB, HCT, PLT in the last 72 hours. BMET:  Recent Labs  11/12/15 0329  NA 139  K 4.2  CL 104  CO2 27  GLUCOSE 109*  BUN 15  CREATININE 1.04*  CALCIUM 9.0    PT/INR:  Recent Labs  11/13/15 0340  LABPROT 21.5*  INR 1.87*   ABG    Component Value Date/Time   PHART 7.395 11/04/2015 1958   HCO3 21.0 11/04/2015 1958   TCO2 21 11/05/2015 1600   ACIDBASEDEF 4.0* 11/04/2015 1958   O2SAT 97.0 11/04/2015 1958   CBG (last 3)  No results for input(s): GLUCAP in the last 72 hours.  Meds Scheduled Meds: . amiodarone  200 mg Oral BID  . antiseptic oral rinse  7 mL Mouth Rinse q12n4p  . aspirin EC  81 mg Oral Daily  . atorvastatin  20 mg Oral q1800  . bisacodyl  10 mg Oral Daily   Or  . bisacodyl  10 mg Rectal Daily  .  budesonide-formoterol  2 puff Inhalation BID  . busPIRone  15 mg Oral TID  . carbamide peroxide  5 drop Right Ear BID  . chlorhexidine  15 mL Mouth Rinse BID  . docusate sodium  200 mg Oral Daily  . DULoxetine  60 mg Oral Daily  . furosemide  20 mg Oral Daily  . mirtazapine  15 mg Oral QHS  . neomycin-polymyxin-hydrocortisone  3 drop Right Ear TID  . pantoprazole  40 mg Oral Daily  . potassium chloride  20 mEq Oral BID  . pregabalin  75 mg Oral BID  . sodium chloride  3 mL Intravenous Q12H  . sodium chloride  3 mL Intravenous Q12H  . warfarin  2.5 mg Oral q1800  . warfarin   Does not apply Once  . Warfarin - Physician Dosing Inpatient   Does not apply q1800   Continuous Infusions:  PRN Meds:.sodium chloride, alum & mag hydroxide-simeth, levalbuterol, magnesium hydroxide, metoprolol, ondansetron (ZOFRAN) IV, oxyCODONE, sodium chloride, zolpidem  Xrays No results found.  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 Hesitant to stop amio with recent afib and continued frequent PAC's. 2 INR decreasing- will increase coumadin dose to 4 mg 3 LFT's normal    LOS: 9 days    GOLD,WAYNE E 11/13/2015  Agree with plan for EP consult for tachy-brady HR after resection of myxoma with portion of atrial septum Keep in stepdown bed patient examined and medical record reviewed,agree with above note. [name] 11/13/2015

## 2015-11-13 NOTE — Consult Note (Signed)
ELECTROPHYSIOLOGY CONSULT NOTE    Patient ID: Rollande Maryott MRN: [id number], DOB/AGE: [birth year] 65 y.o.  Admit date: 11/04/2015 Date of Consult: 11/13/2015  Primary Physician: [name], MD Primary Cardiologist: Meda Coffee Referring Physician: Prescott Gum  Reason for Consultation: tachy/brady syndrome  HPI:  Julia Schwartz is a 65 y.o. female with a past medical history significant for hypertension, fibromyalgia, GERD, and anxiety. She was admitted 09/2015 with cryptogenic stroke. 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 Post surgery, she has had atrial fibrillation with RVR as well as sinus bradycardia.  Her beta blockers have been discontinued due to bradycardia. She is on amiodarone 200mg  bid as well as Warfarin. EP has been asked to evaluate for treatment options.  She reports that she is making progress post stroke but is still frustrated with limited use of right hand.  She feels tachypalpitations with her atrial fibrillation. She has not had chest pain, shortness of breath, recent fevers, chills, nausea or vomiting.    Past Medical History  Diagnosis Date  . Hypertension   . Anxiety   . Depression   . Arthritis   . GERD (gastroesophageal reflux disease)   . Chronic back pain   . Wears dentures     top-partial bottom  . Wears glasses   . Fibromyalgia   . Stroke [facility])     TIA came to er on 1/3  some deficits in right hand  . Hepatitis     hep  c    dx 2007     Surgical History:  Past Surgical History  Procedure Laterality Date  . Submandibular gland excision      left  . Abdominal hysterectomy    . Tonsillectomy    . Bunionectomy      both feet  . Fracture surgery      fx lt ankle  . Tee without cardioversion N/A 10/18/2015    Procedure: TRANSESOPHAGEAL ECHOCARDIOGRAM (TEE);  Surgeon: Mason Spark, MD;  Location: [facility] ENDOSCOPY;  Service: Cardiovascular;  Laterality: N/A;  . Cardiac catheterization N/A  10/23/2015    Procedure: Left Heart Cath and Coronary Angiography;  Surgeon: Belva Crome, MD;  Location: [place] CV LAB;  Service: Cardiovascular;  Laterality: N/A;  . Excision of atrial myxoma N/A 11/04/2015    Procedure: RESECTION OF LEFT ATRIAL MYXOMA, BI-ATRIAL APPROACH WITH PERICARDIAL PATCH;  Surgeon: Ivin Poot, MD;  Location: [place];  Service: Open Heart Surgery;  Laterality: N/A;  . Tee without cardioversion N/A 11/04/2015    Procedure: TRANSESOPHAGEAL ECHOCARDIOGRAM (TEE);  Surgeon: Ivin Poot, MD;  Location: [place];  Service: Open Heart Surgery;  Laterality: N/A;     Prescriptions prior to admission  Medication Sig Dispense Refill Last Dose  . amLODipine-benazepril (LOTREL) 10-40 MG per capsule Take 1 capsule by mouth daily.   11/03/2015 at Unknown time  . atorvastatin (LIPITOR) 20 MG tablet Take 1 tablet (20 mg total) by mouth daily at 6 PM. 30 tablet 0 11/03/2015 at Unknown time  . busPIRone (BUSPAR) 15 MG tablet Take 15 mg by mouth 3 (three) times daily.    11/03/2015 at Unknown time  . DULoxetine (CYMBALTA) 60 MG capsule Take 60 mg by mouth daily.   11/03/2015 at Unknown time  . enoxaparin (LOVENOX) 100 MG/ML injection Inject 1 mL (100 mg total) into the skin daily. 8 Syringe 0 11/01/2015 at Unknown time  . furosemide (LASIX) 20 MG tablet Take 20 mg by mouth daily.  11/03/2015 at Unknown time  . HYDROcodone-acetaminophen (NORCO) 10-325 MG per tablet Take 1 tablet by mouth 3 (three) times daily.    11/03/2015 at Unknown time  . mirtazapine (REMERON) 15 MG tablet Take 15 mg by mouth at bedtime.   11/03/2015 at Unknown time  . omeprazole (PRILOSEC) 40 MG capsule Take 1 capsule (40 mg total) by mouth daily. 30 capsule 0 11/04/2015 at 0500  . pregabalin (LYRICA) 75 MG capsule Take 75-150 mg by mouth 2 (two) times daily. 150mg  in the morning and 75mg  in the evening   11/03/2015 at Unknown time  . tiZANidine (ZANAFLEX) 4 MG capsule Take 4 mg by mouth 3 (three) times daily as needed for  muscle spasms.    11/03/2015 at Unknown time  . zolpidem (AMBIEN) 10 MG tablet Take 10 mg by mouth at bedtime.    11/03/2015 at Unknown time    Inpatient Medications:  . amiodarone  200 mg Oral BID  . antiseptic oral rinse  7 mL Mouth Rinse q12n4p  . aspirin EC  81 mg Oral Daily  . atorvastatin  20 mg Oral q1800  . bisacodyl  10 mg Oral Daily   Or  . bisacodyl  10 mg Rectal Daily  . budesonide-formoterol  2 puff Inhalation BID  . busPIRone  15 mg Oral TID  . carbamide peroxide  5 drop Right Ear BID  . chlorhexidine  15 mL Mouth Rinse BID  . docusate sodium  200 mg Oral Daily  . DULoxetine  60 mg Oral Daily  . furosemide  20 mg Oral Daily  . mirtazapine  15 mg Oral QHS  . neomycin-polymyxin-hydrocortisone  3 drop Right Ear TID  . pantoprazole  40 mg Oral Daily  . potassium chloride  20 mEq Oral BID  . pregabalin  75 mg Oral BID  . sodium chloride  3 mL Intravenous Q12H  . sodium chloride  3 mL Intravenous Q12H  . warfarin  4 mg Oral q1800  . warfarin   Does not apply Once  . Warfarin - Physician Dosing Inpatient   Does not apply q1800    Allergies:  Allergies  Allergen Reactions  . Ultram [Tramadol] Hives    Social History   Social History  . Marital Status: Single    Spouse Name: N/A  . Number of Children: N/A  . Years of Education: N/A   Occupational History  . Not on file.   Social History Main Topics  . Smoking status: Current Every Day Smoker -- 0.25 packs/day for 10 years    Types: Cigarettes  . Smokeless tobacco: Not on file  . Alcohol Use: No  . Drug Use: No  . Sexual Activity: Not on file   Other Topics Concern  . Not on file   Social History Narrative     Family History: no premature CAD  Review of Systems: All other systems reviewed and are otherwise negative except as noted above.  Physical Exam: Filed Vitals:   11/12/15 0644 11/12/15 2207 11/13/15 0600 11/13/15 0858  BP: 126/93 137/72 130/50   Pulse: 81 50 48   Temp: 98.3 F (36.8 C)  97.9 F (36.6 C) 98.4 F (36.9 C)   TempSrc: Oral Oral Oral   Resp: 18 18 18    Height:      Weight: 154 lb 1.6 oz (69.899 kg)  156 lb 8 oz (70.988 kg)   SpO2: 100% 100% 99% 98%    GEN- The patient is well appearing, alert and oriented  x 3 today.   HEENT: normocephalic, atraumatic; sclera clear, conjunctiva pink; hearing intact; oropharynx clear; neck supple  Lungs- Clear to ausculation bilaterally, normal work of breathing.  No wheezes, rales, rhonchi Heart- Bradycardic regular rate and rhythm   GI- soft, non-tender, non-distended, bowel sounds present  Extremities- no clubbing, cyanosis, or edema; DP/PT/radial pulses 2+ bilaterally MS- no significant deformity or atrophy Skin- warm and dry, no rash or lesion Psych- euthymic mood, full affect Neuro- strength and sensation are intact  Labs:   Lab Results  Component Value Date   WBC 10.0 11/07/2015   HGB 9.7* 11/07/2015   HCT 29.6* 11/07/2015   MCV 88.4 11/07/2015   PLT 136* 11/07/2015    Recent Labs Lab 11/12/15 0329  NA 139  K 4.2  CL 104  CO2 27  BUN 15  CREATININE 1.04*  CALCIUM 9.0  PROT 6.4*  BILITOT 0.7  ALKPHOS 50  ALT 24  AST 33  GLUCOSE 109*      Radiology/Studies:  Dg Chest Port 1 View 11/05/2015  CLINICAL DATA:  Left atrial myxoma resection EXAM: PORTABLE CHEST 1 VIEW COMPARISON:  Yesterday FINDINGS: Interval tracheal and esophageal extubation. Thoracic drains in unchanged position. Swan-Ganz catheter from right IJ approach, tip directed towards the right main pulmonary artery. Low volumes with increased hazy appearance of the left lower chest, pleural fluid and/or atelectasis. No pulmonary edema. No pneumothorax. Stable postoperative heart size and mediastinal contours. IMPRESSION: 1. Remaining tubes and central line in stable position. 2. Increased left-sided atelectasis or layering effusion. Electronically Signed   By: Monte Fantasia M.D.   On: 11/05/2015 08:13   Mr Card Morphology W/o Cm 10/25/2015   CLINICAL DATA:  65 year old female with a mass in her left atrium on a TEE. EXAM: CARDIAC MRI TECHNIQUE: The patient was scanned on a 1.5 Tesla GE magnet. A dedicated cardiac coil was used. Functional imaging was done using Fiesta sequences. 2,3, and 4 chamber views were done to assess for RWMA's. Modified Simpson's rule using a short axis stack was used to calculate an ejection fraction on a dedicated work Conservation officer, nature. The didn't receive any contrast. After 10 minutes inversion recovery sequences were used to assess for infiltration and scar tissue. CONTRAST:  None FINDINGS: 1. Normal left ventricular size, thickness and systolic function (LVEF = 57%) with no regional wall motion abnormalities. LVEDD:  46 mm LVESD:  28 mm LVEDV:  76 ml LVESV:  33 ml SV:  43 ml 2. Normal right ventricular size, thickness and systolic function (LVEF = 57%) with no regional wall motion abnormalities. 3.  Normal biatrial size. 4.  Mild mitral regurgitation. 5. Normal size of the aortic root and ascending aorta. Dilated pulmonary artery. 6. There is a mass in the left atrium attached to the supero-anterior portion of the interatrial septum measuring 25 x 19 mm. Because of limited cooperation of the patient and premature termination of study upon patient's request we didn't get images sufficient enough for tissue characterization. There is suggestion of myxoma based on T2 images. IMPRESSION: 1. Normal left ventricular size, thickness and systolic function (LVEF = 57%) with no regional wall motion abnormalities. 2. Normal right ventricular size, thickness and systolic function (LVEF = 57%) with no regional wall motion abnormalities. 3.  Mild mitral regurgitation. 4. Dilated pulmonary artery suggestive of possible pulmonary hypertension. 5. A mass seen in the left atrium attached to the interatrial septum with limited tissue characterization due to patient's non-compliance. No other masses are  seen. Julia Schwartz  Electronically Signed   By: Julia Schwartz   On: 10/25/2015 12:14   [id number] rhythm, rate 89  TELEMETRY: atrial fibrillation with RVR (ventricular rates up to 160's) and sinus rhythm/sinus bradycardia (rates 40-60's)  Assessment/Plan: 1.  Atrial fibrillation with tachy/brady syndrome The patient has newly diagnosed atrial fibrillation this admission with RVR as well as sinus bradycardia. She is on amiodarone 200mg  twice daily for rhythm control. BB had to be discontinued 2/2 bradycardia. I think our best option may be dual chamber pacemaker.  Dr Caryl Comes to see patient later today.  I have tentatively put on board for tomorrow with Dr Curt Bears.  Continue warfarin for CHADS2VASC of at least 4  2.  Atrial myxoma s/p resection Management per TCTS  Signed, Chanetta Marshall, NP 11/13/2015 12:28 PM Hx and PE as above excpet as amended  In addition to the above, she also has a 6 month hx of syncope, three of the four event have been abrupt in onset and offset, with colapse and immediately able to resumee standing, the other was assoc with her being out for 15-30 minutes ????  She has sinus node dysfunction, suggestive of sinus exit blok with HR changes mid 80s to mid 40s and back again.  It is not clear to me how much afib she will have and whether her tachy brady wont resolve post operatively and if that were the only issue i would have recommended waiting prior to pacing tosee if it might resolve on its own over the next few months.  Given the syncope however, it is reasonable to proceed with pacing The benefits and risks were reviewed including but not limited to death,  perforation, infection, lead dislodgement and device malfunction.  The patient understands agrees and is willing to proceed.

## 2015-11-13 NOTE — Progress Notes (Signed)
CARDIAC REHAB PHASE I   PRE:  Rate/Rhythm: 44 SB (? HB)    BP: sitting 96/48    SaO2:   MODE:  Ambulation: 350 ft   POST:  Rate/Rhythm: 170s ? Afib at max, 105 ST with PACs on return to room    BP: sitting 137/106     SaO2: 99 RA   Pts HR continues to fluctuate. She only becomes symptomatic when it reaches 170s (looks like afib). Co dizziness and SOB. Pt up walking, maintained 170s about 2 min then spontaneously resumed 105 ST with PACs before we got back to room. Pt moving well, independently. Frustrated, she wants to go home. Will continue to follow. Encouraged x2 more walks. [id number]   Julia Schwartz CES, ACSM 11/13/2015 11:34 AM

## 2015-11-14 ENCOUNTER — Encounter (HOSPITAL_COMMUNITY): Payer: Self-pay | Admitting: Internal Medicine

## 2015-11-14 ENCOUNTER — Encounter (HOSPITAL_COMMUNITY)
Admission: RE | Disposition: A | Discharge status: Home Health | Payer: Medicare Other | Source: Ambulatory Visit | Attending: Cardiothoracic Surgery

## 2015-11-14 DIAGNOSIS — I495 Sick sinus syndrome: Secondary | ICD-10-CM

## 2015-11-14 HISTORY — PX: EP IMPLANTABLE DEVICE: SHX172B

## 2015-11-14 LAB — PROTIME-INR
INR: 1.65 — ABNORMAL HIGH (ref 0.00–1.49)
Prothrombin Time: 19.5 seconds — ABNORMAL HIGH (ref 11.6–15.2)

## 2015-11-14 SURGERY — PACEMAKER IMPLANT

## 2015-11-14 MED ORDER — METOPROLOL TARTRATE 25 MG PO TABS
25.0000 mg | ORAL_TABLET | Freq: Two times a day (BID) | ORAL | Status: DC
  Administered 2015-11-14 – 2015-11-15 (×3): 25 mg via ORAL
  Filled 2015-11-14 (×3): qty 1

## 2015-11-14 MED ORDER — LIDOCAINE HCL (PF) 1 % IJ SOLN
INTRAMUSCULAR | Status: AC
  Filled 2015-11-14: qty 30

## 2015-11-14 MED ORDER — FENTANYL CITRATE (PF) 100 MCG/2ML IJ SOLN
INTRAMUSCULAR | Status: AC
  Filled 2015-11-14: qty 2

## 2015-11-14 MED ORDER — CEFAZOLIN SODIUM 1-5 GM-% IV SOLN
INTRAVENOUS | Status: AC
  Filled 2015-11-14: qty 50

## 2015-11-14 MED ORDER — ONDANSETRON HCL 4 MG/2ML IJ SOLN: 4.0000 mg | Freq: Four times a day (QID) | INTRAMUSCULAR | Status: DC | PRN

## 2015-11-14 MED ORDER — SODIUM CHLORIDE 0.9 % IR SOLN
Status: AC
  Filled 2015-11-14: qty 2

## 2015-11-14 MED ORDER — CEFAZOLIN SODIUM 1-5 GM-% IV SOLN
1.0000 g | Freq: Four times a day (QID) | INTRAVENOUS | Status: AC
  Administered 2015-11-14 – 2015-11-15 (×3): 1 g via INTRAVENOUS
  Filled 2015-11-14 (×3): qty 50

## 2015-11-14 MED ORDER — SODIUM CHLORIDE 0.9 % IV SOLN: INTRAVENOUS | Status: AC

## 2015-11-14 MED ORDER — ACETAMINOPHEN 325 MG PO TABS: 325.0000 mg | ORAL_TABLET | ORAL | Status: DC | PRN

## 2015-11-14 MED ORDER — FENTANYL CITRATE (PF) 100 MCG/2ML IJ SOLN
INTRAMUSCULAR | Status: DC | PRN
  Administered 2015-11-14 (×4): 25 ug via INTRAVENOUS

## 2015-11-14 MED ORDER — MIDAZOLAM HCL 5 MG/5ML IJ SOLN
INTRAMUSCULAR | Status: DC | PRN
  Administered 2015-11-14 (×3): 1 mg via INTRAVENOUS
  Administered 2015-11-14: 2 mg via INTRAVENOUS

## 2015-11-14 MED ORDER — MIDAZOLAM HCL 2 MG/2ML IJ SOLN
INTRAMUSCULAR | Status: AC
  Filled 2015-11-14: qty 2

## 2015-11-14 MED ORDER — HEPARIN (PORCINE) IN NACL 2-0.9 UNIT/ML-% IJ SOLN
INTRAMUSCULAR | Status: AC
  Filled 2015-11-14: qty 500

## 2015-11-14 MED ORDER — CEFAZOLIN SODIUM-DEXTROSE 2-3 GM-% IV SOLR
INTRAVENOUS | Status: DC | PRN
  Administered 2015-11-14: 2 g via INTRAVENOUS

## 2015-11-14 SURGICAL SUPPLY — 7 items
CABLE SURGICAL S-101-97-12 (CABLE) ×4 IMPLANT
ELECT DEFIB PAD ADLT CADENCE (PAD) ×2 IMPLANT
LEAD CAPSURE NOVUS 45CM (Lead) ×2 IMPLANT
LEAD CAPSURE NOVUS 5076-52CM (Lead) ×2 IMPLANT
PPM ADVISA MRI DR A2DR01 (Pacemaker) ×2 IMPLANT
SHEATH CLASSIC 7F (SHEATH) ×4 IMPLANT
TRAY PACEMAKER INSERTION (PACKS) ×2 IMPLANT

## 2015-11-14 NOTE — Progress Notes (Signed)
Utilization review completed.  

## 2015-11-14 NOTE — Progress Notes (Signed)
PT Cancellation Note  Patient Details Name: Tonnette Zatorski MRN: [id number] DOB: Oct 08, 1951   Cancelled Treatment:    Reason Eval/Treat Not Completed: Patient at surgery for pacemaker implant. PT to follow as appropriate.    Cassell Clement, PT, CSCS Pager [phone] Office [phone]  11/14/2015, 12:54 PM

## 2015-11-14 NOTE — Care Management Important Message (Signed)
Important Message  Patient Details  Name: Shabrea Zimbelman MRN: [id number] Date of Birth: 1951-07-21   Medicare Important Message Given:  Yes    Dawayne Patricia, RN 11/14/2015, 2:39 PM

## 2015-11-14 NOTE — Interval H&P Note (Signed)
History and Physical Interval Note:  11/14/2015 7:36 AM  Julia Schwartz  has presented today for surgery, with the diagnosis of trachibradia  The various methods of treatment have been discussed with the patient and family. After consideration of risks, benefits and other options for treatment, the patient has consented to  Procedure(s): Pacemaker Implant (N/A) as a surgical intervention .  The patient's history has been reviewed, patient examined, no change in status, stable for surgery.  I have reviewed the patient's chart and labs.  Questions were answered to the patient's satisfaction.     Virl Axe

## 2015-11-14 NOTE — H&P (View-Only) (Signed)
ELECTROPHYSIOLOGY CONSULT NOTE    Patient ID: Julia Schwartz MRN: [id number], DOB/AGE: 64-Aug-1952 64 y.o.  Admit date: 11/04/2015 Date of Consult: 11/13/2015  Primary Physician: [name], MD Primary Cardiologist: Meda Coffee Referring Physician: Prescott Gum  Reason for Consultation: tachy/brady syndrome  HPI:  Julia Schwartz is a 65 y.o. female with a past medical history significant for hypertension, fibromyalgia, GERD, and anxiety. She was admitted 09/2015 with cryptogenic stroke. 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 Post surgery, she has had atrial fibrillation with RVR as well as sinus bradycardia.  Her beta blockers have been discontinued due to bradycardia. She is on amiodarone 200mg  bid as well as Warfarin. EP has been asked to evaluate for treatment options.  She reports that she is making progress post stroke but is still frustrated with limited use of right hand.  She feels tachypalpitations with her atrial fibrillation. She has not had chest pain, shortness of breath, recent fevers, chills, nausea or vomiting.    Past Medical History  Diagnosis Date  . Hypertension   . Anxiety   . Depression   . Arthritis   . GERD (gastroesophageal reflux disease)   . Chronic back pain   . Wears dentures     top-partial bottom  . Wears glasses   . Fibromyalgia   . Stroke [facility])     TIA came to er on 1/3  some deficits in right hand  . Hepatitis     hep  c    dx 2007     Surgical History:  Past Surgical History  Procedure Laterality Date  . Submandibular gland excision      left  . Abdominal hysterectomy    . Tonsillectomy    . Bunionectomy      both feet  . Fracture surgery      fx lt ankle  . Tee without cardioversion N/A 10/18/2015    Procedure: TRANSESOPHAGEAL ECHOCARDIOGRAM (TEE);  Surgeon: Karimah Spark, MD;  Location: [facility] ENDOSCOPY;  Service: Cardiovascular;  Laterality: N/A;  . Cardiac catheterization N/A  10/23/2015    Procedure: Left Heart Cath and Coronary Angiography;  Surgeon: Belva Crome, MD;  Location: [facility] CV LAB;  Service: Cardiovascular;  Laterality: N/A;  . Excision of atrial myxoma N/A 11/04/2015    Procedure: RESECTION OF LEFT ATRIAL MYXOMA, BI-ATRIAL APPROACH WITH PERICARDIAL PATCH;  Surgeon: Ivin Poot, MD;  Location: [place];  Service: Open Heart Surgery;  Laterality: N/A;  . Tee without cardioversion N/A 11/04/2015    Procedure: TRANSESOPHAGEAL ECHOCARDIOGRAM (TEE);  Surgeon: Ivin Poot, MD;  Location: [place];  Service: Open Heart Surgery;  Laterality: N/A;     Prescriptions prior to admission  Medication Sig Dispense Refill Last Dose  . amLODipine-benazepril (LOTREL) 10-40 MG per capsule Take 1 capsule by mouth daily.   11/03/2015 at Unknown time  . atorvastatin (LIPITOR) 20 MG tablet Take 1 tablet (20 mg total) by mouth daily at 6 PM. 30 tablet 0 11/03/2015 at Unknown time  . busPIRone (BUSPAR) 15 MG tablet Take 15 mg by mouth 3 (three) times daily.    11/03/2015 at Unknown time  . DULoxetine (CYMBALTA) 60 MG capsule Take 60 mg by mouth daily.   11/03/2015 at Unknown time  . enoxaparin (LOVENOX) 100 MG/ML injection Inject 1 mL (100 mg total) into the skin daily. 8 Syringe 0 11/01/2015 at Unknown time  . furosemide (LASIX) 20 MG tablet Take 20 mg by mouth daily.  11/03/2015 at Unknown time  . HYDROcodone-acetaminophen (NORCO) 10-325 MG per tablet Take 1 tablet by mouth 3 (three) times daily.    11/03/2015 at Unknown time  . mirtazapine (REMERON) 15 MG tablet Take 15 mg by mouth at bedtime.   11/03/2015 at Unknown time  . omeprazole (PRILOSEC) 40 MG capsule Take 1 capsule (40 mg total) by mouth daily. 30 capsule 0 11/04/2015 at 0500  . pregabalin (LYRICA) 75 MG capsule Take 75-150 mg by mouth 2 (two) times daily. 150mg  in the morning and 75mg  in the evening   11/03/2015 at Unknown time  . tiZANidine (ZANAFLEX) 4 MG capsule Take 4 mg by mouth 3 (three) times daily as needed for  muscle spasms.    11/03/2015 at Unknown time  . zolpidem (AMBIEN) 10 MG tablet Take 10 mg by mouth at bedtime.    11/03/2015 at Unknown time    Inpatient Medications:  . amiodarone  200 mg Oral BID  . antiseptic oral rinse  7 mL Mouth Rinse q12n4p  . aspirin EC  81 mg Oral Daily  . atorvastatin  20 mg Oral q1800  . bisacodyl  10 mg Oral Daily   Or  . bisacodyl  10 mg Rectal Daily  . budesonide-formoterol  2 puff Inhalation BID  . busPIRone  15 mg Oral TID  . carbamide peroxide  5 drop Right Ear BID  . chlorhexidine  15 mL Mouth Rinse BID  . docusate sodium  200 mg Oral Daily  . DULoxetine  60 mg Oral Daily  . furosemide  20 mg Oral Daily  . mirtazapine  15 mg Oral QHS  . neomycin-polymyxin-hydrocortisone  3 drop Right Ear TID  . pantoprazole  40 mg Oral Daily  . potassium chloride  20 mEq Oral BID  . pregabalin  75 mg Oral BID  . sodium chloride  3 mL Intravenous Q12H  . sodium chloride  3 mL Intravenous Q12H  . warfarin  4 mg Oral q1800  . warfarin   Does not apply Once  . Warfarin - Physician Dosing Inpatient   Does not apply q1800    Allergies:  Allergies  Allergen Reactions  . Ultram [Tramadol] Hives    Social History   Social History  . Marital Status: Single    Spouse Name: N/A  . Number of Children: N/A  . Years of Education: N/A   Occupational History  . Not on file.   Social History Main Topics  . Smoking status: Current Every Day Smoker -- 0.25 packs/day for 10 years    Types: Cigarettes  . Smokeless tobacco: Not on file  . Alcohol Use: No  . Drug Use: No  . Sexual Activity: Not on file   Other Topics Concern  . Not on file   Social History Narrative     Family History: no premature CAD  Review of Systems: All other systems reviewed and are otherwise negative except as noted above.  Physical Exam: Filed Vitals:   11/12/15 0644 11/12/15 2207 11/13/15 0600 11/13/15 0858  BP: 126/93 137/72 130/50   Pulse: 81 50 48   Temp: 98.3 F (36.8 C)  97.9 F (36.6 C) 98.4 F (36.9 C)   TempSrc: Oral Oral Oral   Resp: 18 18 18    Height:      Weight: 154 lb 1.6 oz (69.899 kg)  156 lb 8 oz (70.988 kg)   SpO2: 100% 100% 99% 98%    GEN- The patient is well appearing, alert and oriented  x 3 today.   HEENT: normocephalic, atraumatic; sclera clear, conjunctiva pink; hearing intact; oropharynx clear; neck supple  Lungs- Clear to ausculation bilaterally, normal work of breathing.  No wheezes, rales, rhonchi Heart- Bradycardic regular rate and rhythm   GI- soft, non-tender, non-distended, bowel sounds present  Extremities- no clubbing, cyanosis, or edema; DP/PT/radial pulses 2+ bilaterally MS- no significant deformity or atrophy Skin- warm and dry, no rash or lesion Psych- euthymic mood, full affect Neuro- strength and sensation are intact  Labs:   Lab Results  Component Value Date   WBC 10.0 11/07/2015   HGB 9.7* 11/07/2015   HCT 29.6* 11/07/2015   MCV 88.4 11/07/2015   PLT 136* 11/07/2015    Recent Labs Lab 11/12/15 0329  NA 139  K 4.2  CL 104  CO2 27  BUN 15  CREATININE 1.04*  CALCIUM 9.0  PROT 6.4*  BILITOT 0.7  ALKPHOS 50  ALT 24  AST 33  GLUCOSE 109*      Radiology/Studies:  Dg Chest Port 1 View 11/05/2015  CLINICAL DATA:  Left atrial myxoma resection EXAM: PORTABLE CHEST 1 VIEW COMPARISON:  Yesterday FINDINGS: Interval tracheal and esophageal extubation. Thoracic drains in unchanged position. Swan-Ganz catheter from right IJ approach, tip directed towards the right main pulmonary artery. Low volumes with increased hazy appearance of the left lower chest, pleural fluid and/or atelectasis. No pulmonary edema. No pneumothorax. Stable postoperative heart size and mediastinal contours. IMPRESSION: 1. Remaining tubes and central line in stable position. 2. Increased left-sided atelectasis or layering effusion. Electronically Signed   By: Monte Fantasia M.D.   On: 11/05/2015 08:13   Mr Card Morphology W/o Cm 10/25/2015   CLINICAL DATA:  65 year old female with a mass in her left atrium on a TEE. EXAM: CARDIAC MRI TECHNIQUE: The patient was scanned on a 1.5 Tesla GE magnet. A dedicated cardiac coil was used. Functional imaging was done using Fiesta sequences. 2,3, and 4 chamber views were done to assess for RWMA's. Modified Simpson's rule using a short axis stack was used to calculate an ejection fraction on a dedicated work Conservation officer, nature. The didn't receive any contrast. After 10 minutes inversion recovery sequences were used to assess for infiltration and scar tissue. CONTRAST:  None FINDINGS: 1. Normal left ventricular size, thickness and systolic function (LVEF = 57%) with no regional wall motion abnormalities. LVEDD:  46 mm LVESD:  28 mm LVEDV:  76 ml LVESV:  33 ml SV:  43 ml 2. Normal right ventricular size, thickness and systolic function (LVEF = 57%) with no regional wall motion abnormalities. 3.  Normal biatrial size. 4.  Mild mitral regurgitation. 5. Normal size of the aortic root and ascending aorta. Dilated pulmonary artery. 6. There is a mass in the left atrium attached to the supero-anterior portion of the interatrial septum measuring 25 x 19 mm. Because of limited cooperation of the patient and premature termination of study upon patient's request we didn't get images sufficient enough for tissue characterization. There is suggestion of myxoma based on T2 images. IMPRESSION: 1. Normal left ventricular size, thickness and systolic function (LVEF = 57%) with no regional wall motion abnormalities. 2. Normal right ventricular size, thickness and systolic function (LVEF = 57%) with no regional wall motion abnormalities. 3.  Mild mitral regurgitation. 4. Dilated pulmonary artery suggestive of possible pulmonary hypertension. 5. A mass seen in the left atrium attached to the interatrial septum with limited tissue characterization due to patient's non-compliance. No other masses are  seen. Ena Dawley  Electronically Signed   By: Ena Dawley   On: 10/25/2015 12:14   [id number] rhythm, rate 89  TELEMETRY: atrial fibrillation with RVR (ventricular rates up to 160's) and sinus rhythm/sinus bradycardia (rates 40-60's)  Assessment/Plan: 1.  Atrial fibrillation with tachy/brady syndrome The patient has newly diagnosed atrial fibrillation this admission with RVR as well as sinus bradycardia. She is on amiodarone 200mg  twice daily for rhythm control. BB had to be discontinued 2/2 bradycardia. I think our best option may be dual chamber pacemaker.  Dr Caryl Comes to see patient later today.  I have tentatively put on board for tomorrow with Dr Curt Bears.  Continue warfarin for CHADS2VASC of at least 4  2.  Atrial myxoma s/p resection Management per TCTS  Signed, Chanetta Marshall, NP 11/13/2015 12:28 PM Hx and PE as above excpet as amended  In addition to the above, she also has a 6 month hx of syncope, three of the four event have been abrupt in onset and offset, with colapse and immediately able to resumee standing, the other was assoc with her being out for 15-30 minutes ????  She has sinus node dysfunction, suggestive of sinus exit blok with HR changes mid 80s to mid 40s and back again.  It is not clear to me how much afib she will have and whether her tachy brady wont resolve post operatively and if that were the only issue i would have recommended waiting prior to pacing tosee if it might resolve on its own over the next few months.  Given the syncope however, it is reasonable to proceed with pacing The benefits and risks were reviewed including but not limited to death,  perforation, infection, lead dislodgement and device malfunction.  The patient understands agrees and is willing to proceed.

## 2015-11-15 ENCOUNTER — Other Ambulatory Visit: Payer: Self-pay

## 2015-11-15 ENCOUNTER — Inpatient Hospital Stay (HOSPITAL_COMMUNITY): Payer: Medicare Other

## 2015-11-15 DIAGNOSIS — I459 Conduction disorder, unspecified: Secondary | ICD-10-CM

## 2015-11-15 LAB — CBC
HEMATOCRIT: 38.2 % (ref 36.0–46.0)
HEMOGLOBIN: 12 g/dL (ref 12.0–15.0)
MCH: 28 pg (ref 26.0–34.0)
MCHC: 31.4 g/dL (ref 30.0–36.0)
MCV: 89 fL (ref 78.0–100.0)
Platelets: 329 10*3/uL (ref 150–400)
RBC: 4.29 MIL/uL (ref 3.87–5.11)
RDW: 13.9 % (ref 11.5–15.5)
WBC: 7.7 10*3/uL (ref 4.0–10.5)

## 2015-11-15 LAB — BASIC METABOLIC PANEL
ANION GAP: 7 (ref 5–15)
BUN: 17 mg/dL (ref 6–20)
CALCIUM: 8.7 mg/dL — AB (ref 8.9–10.3)
CO2: 24 mmol/L (ref 22–32)
Chloride: 104 mmol/L (ref 101–111)
Creatinine, Ser: 1.19 mg/dL — ABNORMAL HIGH (ref 0.44–1.00)
GFR calc Af Amer: 55 mL/min — ABNORMAL LOW (ref >60)
GFR, EST NON AFRICAN AMERICAN: 48 mL/min — AB (ref >60)
GLUCOSE: 103 mg/dL — AB (ref 65–99)
POTASSIUM: 4.6 mmol/L (ref 3.5–5.1)
SODIUM: 135 mmol/L (ref 135–145)

## 2015-11-15 LAB — PROTIME-INR
INR: 1.84 — AB (ref 0.00–1.49)
PROTHROMBIN TIME: 21.2 s — AB (ref 11.6–15.2)

## 2015-11-15 MED ORDER — WARFARIN SODIUM 5 MG PO TABS: 5.0000 mg | ORAL_TABLET | Freq: Every day | ORAL | Status: DC

## 2015-11-15 MED ORDER — AMIODARONE HCL 200 MG PO TABS
200.0000 mg | ORAL_TABLET | Freq: Every day | ORAL | Status: DC
  Administered 2015-11-15: 200 mg via ORAL
  Filled 2015-11-15: qty 1

## 2015-11-15 MED ORDER — AMIODARONE HCL 200 MG PO TABS: 200.0000 mg | ORAL_TABLET | Freq: Every day | ORAL | Status: DC

## 2015-11-15 MED FILL — Gentamicin Sulfate Inj 40 MG/ML: INTRAMUSCULAR | Qty: 2 | Status: AC

## 2015-11-15 MED FILL — Lidocaine HCl Local Preservative Free (PF) Inj 1%: INTRAMUSCULAR | Qty: 60 | Status: AC

## 2015-11-15 MED FILL — Sodium Chloride Irrigation Soln 0.9%: Qty: 500 | Status: AC

## 2015-11-15 MED FILL — Heparin Sodium (Porcine) 2 Unit/ML in Sodium Chloride 0.9%: INTRAMUSCULAR | Qty: 500 | Status: AC

## 2015-11-15 NOTE — Progress Notes (Signed)
    SUBJECTIVE: The patient is doing well today.  At this time, she denies chest pain, shortness of breath, or any new concerns. Some soreness at pacemaker site.   CURRENT MEDICATIONS: . amiodarone  200 mg Oral Daily  . antiseptic oral rinse  7 mL Mouth Rinse q12n4p  . aspirin EC  81 mg Oral Daily  . atorvastatin  20 mg Oral q1800  . bisacodyl  10 mg Oral Daily   Or  . bisacodyl  10 mg Rectal Daily  . budesonide-formoterol  2 puff Inhalation BID  . busPIRone  15 mg Oral TID  . carbamide peroxide  5 drop Right Ear BID  . chlorhexidine  15 mL Mouth Rinse BID  . docusate sodium  200 mg Oral Daily  . DULoxetine  60 mg Oral Daily  . furosemide  20 mg Oral Daily  . metoprolol tartrate  25 mg Oral BID  . mirtazapine  15 mg Oral QHS  . neomycin-polymyxin-hydrocortisone  3 drop Right Ear TID  . pantoprazole  40 mg Oral Daily  . potassium chloride  20 mEq Oral BID  . pregabalin  75 mg Oral BID  . sodium chloride  3 mL Intravenous Q12H  . sodium chloride  3 mL Intravenous Q12H  . warfarin  5 mg Oral q1800  . warfarin   Does not apply Once  . Warfarin - Physician Dosing Inpatient   Does not apply q1800      OBJECTIVE: Physical Exam: Filed Vitals:   11/14/15 1659 11/14/15 2034 11/14/15 2138 11/15/15 0455  BP: 101/63 106/73 108/68 101/65  Pulse: 68 75 74 63  Temp:  98.1 F (36.7 C)  98.3 F (36.8 C)  TempSrc:  Oral  Oral  Resp:  18  18  Height:      Weight:    155 lb 6.4 oz (70.489 kg)  SpO2:  98% 98% 98%    Intake/Output Summary (Last 24 hours) at 11/15/15 0744 Last data filed at 11/15/15 0524  Gross per 24 hour  Intake      0 ml  Output    750 ml  Net   -750 ml    Telemetry reveals sinus rhythm, intermittent atrial pacing, intermittent AF  GEN- The patient is well appearing, alert and oriented x 3 today.   Head- normocephalic, atraumatic Eyes-  Sclera clear, conjunctiva pink Ears- hearing intact Oropharynx- clear Neck- supple  Lungs- Clear to ausculation  bilaterally, normal work of breathing Heart- Regular rate and rhythm  GI- soft, NT, ND, + BS Extremities- no clubbing, cyanosis, or edema Skin- no rash or lesion Psych- euthymic mood, full affect Neuro- strength and sensation are intact  LABS: Basic Metabolic Panel:  Recent Labs  11/15/15 0331  NA 135  K 4.6  CL 104  CO2 24  GLUCOSE 103*  BUN 17  CREATININE 1.19*  CALCIUM 8.7*   CBC:  Recent Labs  11/15/15 0331  WBC 7.7  HGB 12.0  HCT 38.2  MCV 89.0  PLT 329    ASSESSMENT AND PLAN:  Active Problems:   Atrial myxoma   1. Atrial fibrillation with tachy/brady syndrome Continue warfarin for CHADS2VASC of at least 4 S/p PPM implant Device interrogated and functioning normally CXR reviewed Continue low dose amio and metoprolol  2. Atrial myxoma s/p resection Management per TCTS  Wound check and EP follow up arranged. Ok to discharge home from our standpoint.   Chanetta Marshall, NP 11/15/2015 7:47 AM

## 2015-11-15 NOTE — Progress Notes (Addendum)
[address]       [place] [address]             [phone]      1 Day Post-Op Procedure(s) (LRB): Pacemaker Implant (N/A) Subjective: Feels fairly well  Objective: Vital signs in last 24 hours: Temp:  [97.7 F (36.5 C)-98.3 F (36.8 C)] 98.3 F (36.8 C) (01/27 0455) Pulse Rate:  [63-75] 63 (01/27 0455) Cardiac Rhythm:  [-] Atrial fibrillation (01/26 1938) Resp:  [18] 18 (01/27 0455) BP: (101-126)/(63-81) 101/65 mmHg (01/27 0455) SpO2:  [98 %-100 %] 98 % (01/27 0455) Weight:  [155 lb 6.4 oz (70.489 kg)] 155 lb 6.4 oz (70.489 kg) (01/27 0455)  Hemodynamic parameters for last 24 hours:    Intake/Output from previous day: 01/26 0701 - 01/27 0700 In: -  Out: 750 [Urine:750] Intake/Output this shift:    General appearance: alert, cooperative and no distress Heart: regular rate and rhythm Lungs: dim in bases Abdomen: benign Extremities: trace edema Wound: incis haling well (healing)  Lab Results:  Recent Labs  11/15/15 0331  WBC 7.7  HGB 12.0  HCT 38.2  PLT 329   BMET:  Recent Labs  11/15/15 0331  NA 135  K 4.6  CL 104  CO2 24  GLUCOSE 103*  BUN 17  CREATININE 1.19*  CALCIUM 8.7*    PT/INR:  Recent Labs  11/15/15 0331  LABPROT 21.2*  INR 1.84*   ABG    Component Value Date/Time   PHART 7.395 11/04/2015 1958   HCO3 21.0 11/04/2015 1958   TCO2 21 11/05/2015 1600   ACIDBASEDEF 4.0* 11/04/2015 1958   O2SAT 97.0 11/04/2015 1958   CBG (last 3)  No results for input(s): GLUCAP in the last 72 hours.  Meds Scheduled Meds: . amiodarone  200 mg Oral BID  . antiseptic oral rinse  7 mL Mouth Rinse q12n4p  . aspirin EC  81 mg Oral Daily  . atorvastatin  20 mg Oral q1800  . bisacodyl  10 mg Oral Daily   Or  . bisacodyl  10 mg Rectal Daily  . budesonide-formoterol  2 puff Inhalation BID  . busPIRone  15 mg Oral TID  . carbamide peroxide  5 drop Right Ear BID  . chlorhexidine  15 mL Mouth Rinse BID  . docusate sodium   200 mg Oral Daily  . DULoxetine  60 mg Oral Daily  . furosemide  20 mg Oral Daily  . metoprolol tartrate  25 mg Oral BID  . mirtazapine  15 mg Oral QHS  . neomycin-polymyxin-hydrocortisone  3 drop Right Ear TID  . pantoprazole  40 mg Oral Daily  . potassium chloride  20 mEq Oral BID  . pregabalin  75 mg Oral BID  . sodium chloride  3 mL Intravenous Q12H  . sodium chloride  3 mL Intravenous Q12H  . warfarin  4 mg Oral q1800  . warfarin   Does not apply Once  . Warfarin - Physician Dosing Inpatient   Does not apply q1800   Continuous Infusions:  PRN Meds:.sodium chloride, acetaminophen, alum & mag hydroxide-simeth, levalbuterol, magnesium hydroxide, metoprolol, ondansetron (ZOFRAN) IV, oxyCODONE, sodium chloride, zolpidem  Xrays No results found.  Assessment/Plan: S/P Procedure(s) (LRB): Pacemaker Implant (N/A)  1 stable from surgical perspective 2 appears to have had some heart block and possibly afib- QTc is also prolonged- will decrease amio and EP to see this am in follow up after pacer 3 increase coumadin dose to 5mg   Addendum-OK for d/c per EP  LOS: 11 days    Julia Schwartz,Julia Schwartz 11/15/2015

## 2015-11-15 NOTE — Care Management Note (Signed)
Case Management Note Marvetta Gibbons RN, BSN Unit 2W-Case Manager [phone]  Patient Details  Name: Julia Schwartz MRN: [id number] Date of Birth: Feb 08, 1951  Subjective/Objective:   Pt admitted s/p Atrial myxoma resection                 Action/Plan: PTA pt lived at home with family- was active with [facility] for HH-PT/OT- will need resumption orders for [facility] if pt returns home with family- NCM to follow pt progression- PT not recommending inpt rehab/SNF at this time.   Expected Discharge Date:    11/15/15              Expected Discharge Plan:  [place]  In-House Referral:     Discharge planning Services  CM Consult  Post Acute Care Choice:  Home Health, Resumption of Svcs/PTA Provider, Durable Medical Equipment Choice offered to:  Patient  DME Arranged:  [facility] Agency:  [place] Arranged:  PT, OT, RN [facility] Agency:  [place]  Status of Service:  Completed, signed off  Medicare Important Message Given:  Yes Date Medicare IM Given:   11/14/15 Medicare IM give by:   Marvetta Gibbons RN Date Additional Medicare IM Given:    Additional Medicare Important Message give by:     If discussed at [place] of Stay Meetings, dates discussed:  11/14/15  Discharge Disposition: Home/ Home Health   Additional Comments:  11/15/15- pt stable for discharge today s/p PPM on 11/14/15- pt will need INR check on 1/30 per d/c instructions- spoke with Joellen Jersey at [facility] regarding d/c home today, resumption of [place] services and INR check need. Pt also needs RW for home- order placed and spoke with Jermaine with [facility] who will deliver RW to room prior to discharge.  11/08/15- Plan for pt to return home with [facility]- orders placed- spoke with Katie at [facility] regarding resumption orders- possible d/c over weekend when INR therapeutic. [facility] to follow for [facility].   Dawayne Patricia, RN 11/15/2015, 9:55 AM

## 2015-11-15 NOTE — Progress Notes (Signed)
Discussed with the patient and all questioned fully answered. She will call me if any problems arise.  Shamarcus Hoheisel S [place], RN  

## 2015-11-18 ENCOUNTER — Ambulatory Visit (INDEPENDENT_AMBULATORY_CARE_PROVIDER_SITE_OTHER): Payer: Medicare Other | Admitting: Internal Medicine

## 2015-11-18 LAB — POCT INR: INR: 2.6

## 2015-11-20 NOTE — Progress Notes (Signed)
Cardiology Office Note:    Date:  11/21/2015   ID:  Julia Schwartz, DOB 11-16-1950, MRN [id number]  PCP:  [name], MD  Cardiologist:  Dr. Ena Dawley   Electrophysiologist:  Dr. Virl Axe   Chief Complaint  Patient presents with  . Hospitalization Follow-up    Left atrial myxoma excision     History of Present Illness:     Julia Schwartz is a 65 y.o. 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 TEE demonstrated large atrial myxoma. LHC demonstrated normal coronary arteries. Patient was seen by Dr. Prescott Gum who recommended surgical resection. 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 Postoperative course was complicated by blood loss anemia. She required transfusion with 2 units PRBCs. She was placed on Coumadin for stroke prophylaxis with a goal INR 2-2.5. She also developed several episodes of PAF with RVR. CHADS2-VASc=4 (female, HTN, CVA). She was placed on amiodarone and developed tachybradycardia syndrome. She further required permanent pacemaker implantation 11/14/15 with Dr. Caryl Comes.  She returns for follow-up.  Since discharge, she has been doing well. Chest remains sore. She denies significant dyspnea. She denies orthopnea or PND. She denies edema. She denies syncope. She denies fevers or chills. Denies any coughing. Denies melena or hematochezia. She has home health physical therapy.   Past Medical History  Diagnosis Date  . Hypertension   . Anxiety   . Depression   . Arthritis   . GERD (gastroesophageal reflux disease)   . Chronic back pain   . Wears dentures     top-partial bottom  . Wears glasses   . Fibromyalgia   . Stroke [facility])     TIA came to er on 1/3  some deficits in right hand  . Hepatitis     hep  c    dx 2007    Past Surgical History  Procedure Laterality Date  .  Submandibular gland excision      left  . Abdominal hysterectomy    . Tonsillectomy    . Bunionectomy      both feet  . Fracture surgery      fx lt ankle  . Tee without cardioversion N/A 10/18/2015    Procedure: TRANSESOPHAGEAL ECHOCARDIOGRAM (TEE);  Surgeon: Amana Spark, MD;  Location: [facility] ENDOSCOPY;  Service: Cardiovascular;  Laterality: N/A;  . Cardiac catheterization N/A 10/23/2015    Procedure: Left Heart Cath and Coronary Angiography;  Surgeon: Belva Crome, MD;  Location: [place] CV LAB;  Service: Cardiovascular;  Laterality: N/A;  . Excision of atrial myxoma N/A 11/04/2015    Procedure: RESECTION OF LEFT ATRIAL MYXOMA, BI-ATRIAL APPROACH WITH PERICARDIAL PATCH;  Surgeon: Ivin Poot, MD;  Location: [place];  Service: Open Heart Surgery;  Laterality: N/A;  . Tee without cardioversion N/A 11/04/2015    Procedure: TRANSESOPHAGEAL ECHOCARDIOGRAM (TEE);  Surgeon: Ivin Poot, MD;  Location: [place];  Service: Open Heart Surgery;  Laterality: N/A;  . Ep implantable device N/A 11/14/2015    Procedure: Pacemaker Implant;  Surgeon: Deboraha Sprang, MD;  Location: [place] CV LAB;  Service: Cardiovascular;  Laterality: N/A;    Current Medications: Outpatient Prescriptions Prior to Visit  Medication Sig Dispense Refill  . amiodarone (PACERONE) 200 MG tablet Take 1 tablet (200 mg total) by mouth daily. 30 tablet 1  . aspirin  EC 81 MG EC tablet Take 1 tablet (81 mg total) by mouth daily.    Marland Kitchen atorvastatin (LIPITOR) 20 MG tablet Take 1 tablet (20 mg total) by mouth daily at 6 PM. 30 tablet 0  . busPIRone (BUSPAR) 15 MG tablet Take 15 mg by mouth 3 (three) times daily.     . DULoxetine (CYMBALTA) 60 MG capsule Take 60 mg by mouth daily.    . furosemide (LASIX) 20 MG tablet Take 20 mg by mouth daily.    . metoprolol tartrate (LOPRESSOR) 25 MG tablet Take 1 tablet (25 mg total) by mouth 2 (two) times daily. 60 tablet 1  . mirtazapine (REMERON) 15 MG tablet Take 15 mg by mouth at  bedtime.    Marland Kitchen omeprazole (PRILOSEC) 40 MG capsule Take 1 capsule (40 mg total) by mouth daily. 30 capsule 0  . oxyCODONE (OXY IR/ROXICODONE) 5 MG immediate release tablet Take 1 tablet (5 mg total) by mouth every 4 (four) hours as needed for severe pain. 30 tablet 0  . pregabalin (LYRICA) 75 MG capsule Take 75-150 mg by mouth 2 (two) times daily. 150mg  in the morning and 75mg  in the evening    . tiZANidine (ZANAFLEX) 4 MG capsule Take 4 mg by mouth 3 (three) times daily as needed for muscle spasms.     Marland Kitchen zolpidem (AMBIEN) 10 MG tablet Take 10 mg by mouth at bedtime.     Marland Kitchen warfarin (COUMADIN) 5 MG tablet Take 1 tablet (5 mg total) by mouth daily. (Patient not taking: Reported on 11/21/2015) 30 tablet 1   No facility-administered medications prior to visit.     Allergies:   Ultram   Social History   Social History  . Marital Status: Single    Spouse Name: N/A  . Number of Children: N/A  . Years of Education: N/A   Social History Main Topics  . Smoking status: Current Every Day Smoker -- 0.25 packs/day for 10 years    Types: Cigarettes  . Smokeless tobacco: None  . Alcohol Use: No  . Drug Use: No  . Sexual Activity: Not Asked   Other Topics Concern  . None   Social History Narrative     Family History:  The patient's family history includes COPD in her mother; Diabetes in her maternal grandmother.   ROS:   Please see the history of present illness.    Review of Systems  HENT: Positive for headaches.   Cardiovascular: Positive for chest pain.  Musculoskeletal: Positive for back pain.  Gastrointestinal: Positive for constipation.  Psychiatric/Behavioral: Positive for depression.  All other systems reviewed and are negative.   Physical Exam:    VS:  BP 100/70 mmHg  Pulse 76  Ht 5\' 1"  (1.549 m)  Wt 158 lb 12.8 oz (72.031 kg)  BMI 30.02 kg/m2   GEN: Well nourished, well developed, in no acute distress HEENT: normal Neck: no JVD, no masses Cardiac: Normal S1/S2, RRR; no  murmurs, no edema Chest: Median sternotomy well-healed without erythema or discharge; pacer site with Steri-Strips in place, no hematoma  Respiratory:  clear to auscultation bilaterally; no wheezing, rhonchi or rales GI: soft, nontender MS: no deformity or atrophy Skin: warm and dry, no rash Neuro:  no focal deficits  Psych: Alert and oriented x 3, normal affect  Wt Readings from Last 3 Encounters:  11/21/15 158 lb 12.8 oz (72.031 kg)  11/15/15 155 lb 6.4 oz (70.489 kg)  10/31/15 160 lb (72.576 kg)  Studies/Labs Reviewed:     EKG:  EKG is  ordered today.  The ekg ordered today demonstrates NSR, HR 77, normal axis, nonspecific ST-T wave changes, QTC 353 ms, no change from prior tracing  Recent Labs: 11/05/2015: Magnesium 1.9 11/12/2015: ALT 24 11/15/2015: BUN 17; Creatinine, Ser 1.19*; Hemoglobin 12.0; Platelets 329; Potassium 4.6; Sodium 135   Recent Lipid Panel    Component Value Date/Time   CHOL 141 10/18/2015 0539   TRIG 78 10/18/2015 0539   HDL 42 10/18/2015 0539   CHOLHDL 3.4 10/18/2015 0539   VLDL 16 10/18/2015 0539   LDLCALC 83 10/18/2015 0539    Additional studies/ records that were reviewed today include:   Carotid US 10/24/15 No evidence of stenosis in bilateral ICA  LHC 10/23/15 Normal coronary arteries  TEE 10/18/15 EF 55-60%, normal wall motion, mild MR, LA with large mobile density attached to the superioro-anterior portion of the interatrial septum measuring 27 x 29 mm, no LAA clot  Echo 10/18/15 Mild focal basal septal hypertrophy, EF 55%,? Mass in LA   ASSESSMENT:     1. Atrial myxoma   2. PAF (paroxysmal atrial fibrillation) (HCC)   3. Cardiac pacemaker in situ   4. Essential hypertension   5. HLD (hyperlipidemia)   6. 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 Tobacco abuse      PLAN:     In order of problems listed above:  1. L Atrial Myxoma s/p resection - Overall, she is doing well. Follow-up with  Dr. Prescott Gum as planned. Obtain follow-up echo.  2. PAF - Maintaining NSR.  AF occurred post myxoma resection.  Consider stopping Amiodarone eventually.  Will need to remain on Coumadin long term. CHADS2-VASc=4.  Will see if Dr. Prescott Gum is ok with a goal of INR 2-3 now.  Check LFTs and TSH with labs in 6 weeks.    3. S/p Pacer - Secondary to tachy-brady syndrome.  FU with EP as planned. Wound check today.  4. HTN - Controlled.  Check BMET today.  5. HL - Now on Lipitor.  Arrange FU Lipids and LFTs in 6 weeks.   6. S/p Embolic CVA - FU with Neuro as planned.   7. Tobacco abuse - She has quit smoking.      Medication Adjustments/Labs and Tests Ordered: Current medicines are reviewed at length with the patient today.  Concerns regarding medicines are outlined above.  Medication changes, Labs and Tests ordered today are outlined in the Patient Instructions noted below. Patient Instructions  Medication Instructions:  Your physician recommends that you continue on your current medications as directed. Please refer to the Current Medication list given to you today.  Labwork: 1. TODAY BMET ONLY  2. IN 6 WEEKS FOR FASTING LIPID AND LIVE PANEL AND TSH  Testing/Procedures: Your physician has requested that you have an echocardiogram. Echocardiography is a painless test that uses sound waves to create images of your heart. It provides your doctor with information about the size and shape of your heart and how well your heart's chambers and valves are working. This procedure takes approximately one hour. There are no restrictions for this procedure.  Follow-Up: Your physician recommends that you schedule a follow-up appointment in: [place]  Your physician recommends that you schedule a follow-up appointment in: [place]  Any Other Special Instructions Will Be Listed Below (If Applicable).  If you need a refill on your cardiac  medications before your next  appointment, please call your pharmacy.      Signed, Richardson Dopp, PA-C  11/21/2015 1:10 PM    [facility] [employer], [place], [place]  [address] Phone: [phone]; Fax: [phone]

## 2015-11-21 ENCOUNTER — Encounter: Payer: Self-pay | Admitting: Internal Medicine

## 2015-11-21 ENCOUNTER — Ambulatory Visit (INDEPENDENT_AMBULATORY_CARE_PROVIDER_SITE_OTHER): Payer: Medicare Other | Admitting: Physician Assistant

## 2015-11-21 ENCOUNTER — Ambulatory Visit (INDEPENDENT_AMBULATORY_CARE_PROVIDER_SITE_OTHER): Payer: Medicare Other | Admitting: *Deleted

## 2015-11-21 ENCOUNTER — Encounter: Payer: Self-pay | Admitting: Physician Assistant

## 2015-11-21 VITALS — BP 100/70 | HR 76 | Ht 61.0 in | Wt 158.8 lb

## 2015-11-21 DIAGNOSIS — I10 Essential (primary) hypertension: Secondary | ICD-10-CM | POA: Diagnosis not present

## 2015-11-21 DIAGNOSIS — Z950 Presence of cardiac pacemaker: Secondary | ICD-10-CM

## 2015-11-21 DIAGNOSIS — I480 Paroxysmal atrial fibrillation: Secondary | ICD-10-CM | POA: Diagnosis not present

## 2015-11-21 DIAGNOSIS — I6340 Cerebral infarction due to embolism of unspecified cerebral artery: Secondary | ICD-10-CM

## 2015-11-21 DIAGNOSIS — Z720 Tobacco use: Secondary | ICD-10-CM

## 2015-11-21 DIAGNOSIS — E785 Hyperlipidemia, unspecified: Secondary | ICD-10-CM

## 2015-11-21 DIAGNOSIS — D151 Benign neoplasm of heart: Secondary | ICD-10-CM | POA: Diagnosis not present

## 2015-11-21 LAB — CUP PACEART INCLINIC DEVICE CHECK
Battery Voltage: 3.11 V
Brady Statistic AS VP Percent: 0.02 %
Brady Statistic RA Percent Paced: 2.85 %
Implantable Lead Implant Date: 20170126
Implantable Lead Location: 753860
Implantable Lead Model: 5076
Implantable Lead Model: 5076
Lead Channel Impedance Value: 323 Ohm
Lead Channel Impedance Value: 570 Ohm
Lead Channel Pacing Threshold Pulse Width: 0.5 ms
Lead Channel Sensing Intrinsic Amplitude: 0.75 mV
MDC IDC LEAD IMPLANT DT: 20170126
MDC IDC LEAD LOCATION: 753859
MDC IDC MSMT LEADCHNL RA IMPEDANCE VALUE: 361 Ohm
MDC IDC MSMT LEADCHNL RA PACING THRESHOLD AMPLITUDE: 0.75 V
MDC IDC MSMT LEADCHNL RV IMPEDANCE VALUE: 437 Ohm
MDC IDC MSMT LEADCHNL RV PACING THRESHOLD AMPLITUDE: 0.75 V
MDC IDC MSMT LEADCHNL RV PACING THRESHOLD PULSEWIDTH: 0.4 ms
MDC IDC MSMT LEADCHNL RV SENSING INTR AMPL: 8.75 mV
MDC IDC SESS DTM: 20170202113004
MDC IDC SET LEADCHNL RA PACING AMPLITUDE: 2 V
MDC IDC SET LEADCHNL RV PACING AMPLITUDE: 3.5 V
MDC IDC SET LEADCHNL RV PACING PULSEWIDTH: 0.4 ms
MDC IDC SET LEADCHNL RV SENSING SENSITIVITY: 2.8 mV
MDC IDC STAT BRADY AP VP PERCENT: 0.03 %
MDC IDC STAT BRADY AP VS PERCENT: 2.82 %
MDC IDC STAT BRADY AS VS PERCENT: 97.13 %
MDC IDC STAT BRADY RV PERCENT PACED: 0.05 %

## 2015-11-21 LAB — BASIC METABOLIC PANEL
BUN: 12 mg/dL (ref 7–25)
CALCIUM: 9.3 mg/dL (ref 8.6–10.4)
CO2: 26 mmol/L (ref 20–31)
CREATININE: 0.97 mg/dL (ref 0.50–0.99)
Chloride: 103 mmol/L (ref 98–110)
Glucose, Bld: 100 mg/dL — ABNORMAL HIGH (ref 65–99)
Potassium: 4 mmol/L (ref 3.5–5.3)
Sodium: 137 mmol/L (ref 135–146)

## 2015-11-21 NOTE — Progress Notes (Signed)
Wound check appointment. Steri-strips removed. Wound without redness or edema. Incision edges approximated, wound well healed. Normal device function. Thresholds, sensing, and impedances consistent with implant measurements. Device programmed at 3.5V for extra safety margin until 3 month visit. Histogram distribution appropriate for patient and level of activity. No mode switches or high ventricular rates noted. Patient educated about wound care, arm mobility, lifting restrictions. ROV with SK 02/25/16.

## 2015-11-21 NOTE — Patient Instructions (Addendum)
Medication Instructions:  Your physician recommends that you continue on your current medications as directed. Please refer to the Current Medication list given to you today.  Labwork: 1. TODAY BMET ONLY  2. IN 6 WEEKS FOR FASTING LIPID AND LIVE PANEL AND TSH  Testing/Procedures: Your physician has requested that you have an echocardiogram. Echocardiography is a painless test that uses sound waves to create images of your heart. It provides your doctor with information about the size and shape of your heart and how well your heart's chambers and valves are working. This procedure takes approximately one hour. There are no restrictions for this procedure.  Follow-Up: Your physician recommends that you schedule a follow-up appointment in: [place]  Your physician recommends that you schedule a follow-up appointment in: [place]  Any Other Special Instructions Will Be Listed Below (If Applicable).  If you need a refill on your cardiac medications before your next appointment, please call your pharmacy.

## 2015-11-22 ENCOUNTER — Telehealth: Payer: Self-pay | Admitting: *Deleted

## 2015-11-22 ENCOUNTER — Ambulatory Visit: Payer: Medicare Other | Admitting: Family Medicine

## 2015-11-22 ENCOUNTER — Ambulatory Visit (INDEPENDENT_AMBULATORY_CARE_PROVIDER_SITE_OTHER): Payer: Medicare Other | Admitting: Cardiology

## 2015-11-22 LAB — POCT INR: INR: 2.7

## 2015-11-22 NOTE — Telephone Encounter (Signed)
Mail box full, could not lmom. I will try again later.

## 2015-11-22 NOTE — Telephone Encounter (Signed)
Pt has been notified of lab results by phone with verbal understanding. 

## 2015-11-28 ENCOUNTER — Ambulatory Visit (HOSPITAL_COMMUNITY): Payer: Medicare Other | Attending: Physician Assistant

## 2015-11-28 ENCOUNTER — Other Ambulatory Visit: Payer: Self-pay

## 2015-11-28 ENCOUNTER — Encounter: Payer: Self-pay | Admitting: Physician Assistant

## 2015-11-28 DIAGNOSIS — F17200 Nicotine dependence, unspecified, uncomplicated: Secondary | ICD-10-CM | POA: Diagnosis not present

## 2015-11-28 DIAGNOSIS — I340 Nonrheumatic mitral (valve) insufficiency: Secondary | ICD-10-CM | POA: Insufficient documentation

## 2015-11-28 DIAGNOSIS — I517 Cardiomegaly: Secondary | ICD-10-CM | POA: Diagnosis not present

## 2015-11-28 DIAGNOSIS — D151 Benign neoplasm of heart: Secondary | ICD-10-CM | POA: Diagnosis not present

## 2015-11-28 DIAGNOSIS — Z6829 Body mass index (BMI) 29.0-29.9, adult: Secondary | ICD-10-CM | POA: Diagnosis not present

## 2015-11-28 DIAGNOSIS — I10 Essential (primary) hypertension: Secondary | ICD-10-CM | POA: Diagnosis not present

## 2015-11-28 DIAGNOSIS — I071 Rheumatic tricuspid insufficiency: Secondary | ICD-10-CM | POA: Insufficient documentation

## 2015-11-28 DIAGNOSIS — E669 Obesity, unspecified: Secondary | ICD-10-CM | POA: Insufficient documentation

## 2015-11-29 ENCOUNTER — Ambulatory Visit (INDEPENDENT_AMBULATORY_CARE_PROVIDER_SITE_OTHER): Payer: Medicare Other | Admitting: Cardiology

## 2015-11-29 ENCOUNTER — Telehealth: Payer: Self-pay | Admitting: *Deleted

## 2015-11-29 LAB — POCT INR: INR: 2

## 2015-11-29 NOTE — Telephone Encounter (Signed)
Pt has been notified echo results and that heart looks good where tumor was removed. Pt verbalized understanding.

## 2015-11-29 NOTE — Telephone Encounter (Signed)
Lmptcb for echo results

## 2015-12-02 ENCOUNTER — Encounter: Payer: Self-pay | Admitting: *Deleted

## 2015-12-02 ENCOUNTER — Other Ambulatory Visit: Payer: Self-pay | Admitting: *Deleted

## 2015-12-02 DIAGNOSIS — D151 Benign neoplasm of heart: Secondary | ICD-10-CM

## 2015-12-04 ENCOUNTER — Ambulatory Visit (INDEPENDENT_AMBULATORY_CARE_PROVIDER_SITE_OTHER): Payer: Self-pay | Admitting: Cardiothoracic Surgery

## 2015-12-04 ENCOUNTER — Ambulatory Visit
Admission: RE | Admit: 2015-12-04 | Discharge: 2015-12-04 | Disposition: A | Discharge status: Home / Self Care | Payer: Medicare Other | Source: Ambulatory Visit | Attending: Cardiothoracic Surgery | Admitting: Cardiothoracic Surgery

## 2015-12-04 ENCOUNTER — Encounter: Payer: Self-pay | Admitting: Cardiothoracic Surgery

## 2015-12-04 VITALS — BP 112/77 | HR 90 | Resp 20 | Ht 61.0 in | Wt 158.0 lb

## 2015-12-04 DIAGNOSIS — D151 Benign neoplasm of heart: Secondary | ICD-10-CM

## 2015-12-04 NOTE — Progress Notes (Signed)
PCP is [name], MD Referring Provider is Surina Spark, MD  Chief Complaint  Patient presents with  . 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 The patient presented with a right-sided stroke and was found have atrial myxoma. She still has residual weakness and clumsiness with her right hand but her speech has returned to normal. The patient had sternotomy and resection of the myxoma. Postoperatively she had tachycardia-bradycardia syndrome and a permanent pacemaker was placed by EP before discharge. She's placed on Coumadin and is on amiodarone. She currently is in a stable sinus rhythm and remains on amiodarone once daily. Her chest x-ray is clear. A postop echocardiogram was performed which showed the successful resection of the myxoma without evidence of a ASD. Good biventricular function.  Patient has had no bleeding complications from her Coumadin. Last INR was 2.6. The patient does not drive. She has been walking daily.   Past Medical History  Diagnosis Date  . Hypertension   . Anxiety   . Depression   . Arthritis   . GERD (gastroesophageal reflux disease)   . Chronic back pain   . Wears dentures     top-partial bottom  . Wears glasses   . Fibromyalgia   . Stroke [facility])     TIA came to er on 1/3  some deficits in right hand  . Hepatitis     hep  c    dx 2007  . Atrial myxoma     a. 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 Echo 2/17:  Mild focal basal septal hypertrophy, EF 50-55%, no RWMA, Gr 1 DD, trivial MR, normal RVF, mild TR, pericardial patch in interatrial septum patent - no residual flow    Past Surgical History  Procedure Laterality Date  . Submandibular gland excision      left  . Abdominal hysterectomy    . Tonsillectomy    . Bunionectomy      both feet   . Fracture surgery      fx lt ankle  . Tee without cardioversion N/A 10/18/2015    Procedure: TRANSESOPHAGEAL ECHOCARDIOGRAM (TEE);  Surgeon: Jernie Spark, MD;  Location: [facility] ENDOSCOPY;  Service: Cardiovascular;  Laterality: N/A;  . Cardiac catheterization N/A 10/23/2015    Procedure: Left Heart Cath and Coronary Angiography;  Surgeon: Belva Crome, MD;  Location: [place] CV LAB;  Service: Cardiovascular;  Laterality: N/A;  . Excision of atrial myxoma N/A 11/04/2015    Procedure: RESECTION OF LEFT ATRIAL MYXOMA, BI-ATRIAL APPROACH WITH PERICARDIAL PATCH;  Surgeon: Ivin Poot, MD;  Location: [place];  Service: Open Heart Surgery;  Laterality: N/A;  . Tee without cardioversion N/A 11/04/2015    Procedure: TRANSESOPHAGEAL ECHOCARDIOGRAM (TEE);  Surgeon: Ivin Poot, MD;  Location: [place];  Service: Open Heart Surgery;  Laterality: N/A;  . Ep implantable device N/A 11/14/2015    Procedure: Pacemaker Implant;  Surgeon: Deboraha Sprang, MD;  Location: [place] CV LAB;  Service: Cardiovascular;  Laterality: N/A;    Family History  Problem Relation Age of Onset  . COPD Mother   . Diabetes Maternal Grandmother     Social History Social History  Substance Use Topics  . Smoking status: Current Every Day Smoker -- 0.25 packs/day for 10 years  Types: Cigarettes  . Smokeless tobacco: None  . Alcohol Use: No    Current Outpatient Prescriptions  Medication Sig Dispense Refill  . amiodarone (PACERONE) 200 MG tablet Take 1 tablet (200 mg total) by mouth daily. 30 tablet 1  . aspirin EC 81 MG EC tablet Take 1 tablet (81 mg total) by mouth daily.    Marland Kitchen atorvastatin (LIPITOR) 20 MG tablet Take 1 tablet (20 mg total) by mouth daily at 6 PM. 30 tablet 0  . busPIRone (BUSPAR) 15 MG tablet Take 15 mg by mouth 3 (three) times daily.     . DULoxetine (CYMBALTA) 60 MG capsule Take 60 mg by mouth daily.    . furosemide (LASIX) 20 MG tablet Take 20 mg by mouth daily.    . metoprolol tartrate  (LOPRESSOR) 25 MG tablet Take 1 tablet (25 mg total) by mouth 2 (two) times daily. 60 tablet 1  . mirtazapine (REMERON) 15 MG tablet Take 15 mg by mouth at bedtime.    Marland Kitchen omeprazole (PRILOSEC) 40 MG capsule Take 1 capsule (40 mg total) by mouth daily. 30 capsule 0  . oxyCODONE (OXY IR/ROXICODONE) 5 MG immediate release tablet Take 1 tablet (5 mg total) by mouth every 4 (four) hours as needed for severe pain. 30 tablet 0  . pregabalin (LYRICA) 75 MG capsule Take 75-150 mg by mouth 2 (two) times daily. 150mg  in the morning and 75mg  in the evening    . tiZANidine (ZANAFLEX) 4 MG capsule Take 4 mg by mouth 3 (three) times daily as needed for muscle spasms.     Marland Kitchen warfarin (COUMADIN) 5 MG tablet Take 5 mg by mouth daily. PATIENT WAS INSTRUCTED TO TAKE 1/2 TABLET BY MOUTH ON Tuesday, Thursday, AND Saturday AND WHOLE TABLET BY MOUTH ON Monday, Wednesday, Friday and Sunday.    . zolpidem (AMBIEN) 10 MG tablet Take 10 mg by mouth at bedtime.      No current facility-administered medications for this visit.    Allergies  Allergen Reactions  . Ultram [Tramadol] Hives    Review of Systems   Overall strength is improved but she is frustrated with her right hand clumsiness from the stroke Good appetite Incisions without drainage Minimal pedal edema Still requires narcotics for surgical pain  BP 112/77 mmHg  Pulse 90  Resp 20  Ht 5\' 1"  (1.549 m)  Wt 158 lb (71.668 kg)  BMI 29.87 kg/m2  SpO2 97% Physical Exam Alert and pleasant Lungs clear Heart rate regular, without murmur or gallop Abdomen soft Extremities without edema No focal neurologic deficit  Diagnostic Tests: Chest x-ray today clear  Impression: Excellent recovery after resection of left atrial myxoma Patient is now able to do normal daily activities and lift up to 15 pounds maximum. She should be able to stop the amiodarone in March, continue Coumadin per neurology Plan: Return in 6 weeks for reassessment and monitoring of  progress  [name], MD [facility] [phone]

## 2015-12-06 ENCOUNTER — Ambulatory Visit (INDEPENDENT_AMBULATORY_CARE_PROVIDER_SITE_OTHER): Payer: Medicare Other | Admitting: Internal Medicine

## 2015-12-06 ENCOUNTER — Telehealth: Payer: Self-pay

## 2015-12-06 LAB — POCT INR: INR: 2.1

## 2015-12-06 NOTE — Telephone Encounter (Signed)
Signed INR orders faxed back to [place]

## 2015-12-20 ENCOUNTER — Ambulatory Visit (INDEPENDENT_AMBULATORY_CARE_PROVIDER_SITE_OTHER): Payer: Medicare Other | Admitting: Internal Medicine

## 2015-12-20 LAB — POCT INR: INR: 2

## 2015-12-25 ENCOUNTER — Telehealth: Payer: Self-pay

## 2015-12-25 NOTE — Telephone Encounter (Signed)
Faxed signed INR orders to [place].

## 2016-01-08 ENCOUNTER — Encounter: Payer: Self-pay | Admitting: Neurology

## 2016-01-08 ENCOUNTER — Ambulatory Visit (INDEPENDENT_AMBULATORY_CARE_PROVIDER_SITE_OTHER): Payer: Medicare Other | Admitting: Neurology

## 2016-01-08 VITALS — BP 103/70 | HR 76 | Ht 61.0 in | Wt 169.0 lb

## 2016-01-08 DIAGNOSIS — I6340 Cerebral infarction due to embolism of unspecified cerebral artery: Secondary | ICD-10-CM

## 2016-01-08 DIAGNOSIS — D151 Benign neoplasm of heart: Secondary | ICD-10-CM | POA: Diagnosis not present

## 2016-01-08 DIAGNOSIS — Z720 Tobacco use: Secondary | ICD-10-CM

## 2016-01-08 DIAGNOSIS — Z7901 Long term (current) use of anticoagulants: Secondary | ICD-10-CM

## 2016-01-08 DIAGNOSIS — E785 Hyperlipidemia, unspecified: Secondary | ICD-10-CM

## 2016-01-08 NOTE — Progress Notes (Signed)
STROKE NEUROLOGY FOLLOW UP NOTE  NAME: Amayia Nahas DOB: 10-07-1951  REASON FOR VISIT: stroke follow up HISTORY FROM: pt and chart  Today we had the pleasure of seeing Meekah Furlough in follow-up at our Neurology Clinic. Pt was accompanied by no one.   History Summary Ms. Maretha Goitia is a 65 y.o. female with history of HTN, smoker, anxiety, depression and fibromyalgia presenting with right-sided weakness. MRI showed bilateral punctate infarcts L>R, embolic pattern. CTA head and neck, TTE, LE venous doppler all negative. TEE done showed left atrial septum mobile lesion, suspicious for myxoma. LDL 83, and A1C 5.9. Hypercoagulable work up showed low protein S and slight positive lupus anticoagulant. Her stroke most likely due to cardiac mass. She was started on heparin drip and discharged on lovenox.   She had appointment with Dr. Prescott Gum on 10/31/15 and had myxoma resection surgery on 11/04/15. 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 Postoperative course was complicated by blood loss anemia. She required transfusion with 2 units PRBCs. She was placed on Coumadin for stroke prophylaxis with a goal INR 2-2.5. She also developed several episodes of PAF with RVR. CHADS2-VASc=4 (female, HTN, CVA). She was placed on amiodarone and developed tachybradycardia syndrome. She further required permanent pacemaker implantation 11/14/15 with Dr. Caryl Comes.  Interval History During the interval time, the patient has been doing well. Neuro deficit resolved except that patient still complains not able to write well with right hand. Had a follow-up with cardiology and CT surgery, continued on aspirin and Coumadin with INR goal 2.0-2.5. Last INR 2.0. Patient continues smoking, about one pack per day. Blood pressure today 103/70.  REVIEW OF SYSTEMS: Full 14 system review of systems performed and notable only for those listed below and in HPI above, all  others are negative:  Constitutional:   Cardiovascular:  Leg swelling Ear/Nose/Throat:   Skin:  Eyes:   Respiratory:   Gastroitestinal:   Genitourinary:  Hematology/Lymphatic:   Endocrine:  Musculoskeletal:   Allergy/Immunology:   Neurological:   Psychiatric:  Sleep:   The following represents the patient's updated allergies and side effects list: Allergies  Allergen Reactions  . Ultram [Tramadol] Hives    The neurologically relevant items on the patient's problem list were reviewed on today's visit.  Neurologic Examination  A problem focused neurological exam (12 or more points of the single system neurologic examination, vital signs counts as 1 point, cranial nerves count for 8 points) was performed.  Blood pressure 103/70, pulse 76, height 5\' 1"  (1.549 m), weight 169 lb (76.658 kg).  General - Well nourished, well developed, in no apparent distress.  Ophthalmologic - Fundi not visualized due to noncooperation.  Cardiovascular - Regular rate and rhythm.  Mental Status -  Level of arousal and orientation to time, place, and person were intact. Language including expression, naming, repetition, comprehension was assessed and found intact. Fund of Knowledge was assessed and was intact.  Cranial Nerves II - XII - II - Visual field intact OU, no visual field cut. III, IV, VI - Extraocular movements intact. V - Facial sensation intact bilaterally. VII - right nasolabial fold flattening. VIII - Hearing & vestibular intact bilaterally. X - Palate elevates symmetrically. XI - Chin turning & shoulder shrug intact bilaterally. XII - Tongue protrusion intact.  Motor Strength - The patient's strength was normal in all extremities and pronator drift was absent.  Bulk was normal and fasciculations were absent.   Motor Tone -  Muscle tone was assessed at the neck and appendages and was normal.  Reflexes - The patient's reflexes were 1+ in all extremities and she had no  pathological reflexes.  Sensory - Light touch, temperature/pinprick were assessed and were normal.    Coordination - The patient had normal movements in the hands and feet with no ataxia or dysmetria.  Tremor was absent.  Gait and Station - The patient's transfers, posture, gait, station, and turns were observed as normal.  Data reviewed: I personally reviewed the images and agree with the radiology interpretations.  Ct Head Wo Contrast 10/17/2015 Suspicious asymmetric hyperdensity at the left ICA terminus, but otherwise normal for age noncontrast CT appearance of the brain.   CT HEAD 10/17/2015 : No abnormal intracranial enhancement.   CTA NECK 10/17/2015 : No acute vascular process or hemodynamically significant stenosis.   CTA HEAD 10/17/2015 : No acute large vessel occlusion or high-grade stenosis. Complete circle of Willis.   Mr Brain Wo Contrast 10/17/2015 Innumerable punctate infarctions affecting both cerebral hemispheres, much more extensive on the left than the right, which could be due to a shower of micro emboli or watershed infarctions. No large confluent infarction. No mass effect or hemorrhage.   Bilateral lower extremity venous duplex  Visualized veins of bilateral lower extremities are negative for deep vein thrombosis. There is no evidence of Baker's cyst bilaterally.  2D Echocardiogram  - Left ventricle: The cavity size was normal. There was mild focalbasal hypertrophy of the septum. Systolic function was normal.The estimated ejection fraction was 55%. Wall motion was normal;there were no regional wall motion abnormalities. - Atrial septum: No defect or patent foramen ovale was identified. - Impressions: No cardiac source of emboli was indentified.  TEE - There is a large mobile echodensity attached to the supero-anterior portion of the interatrial septum from the left atrial side. The echodensity measures 27 x 29 mm and has one largest lobe with  multiple smaller thin mobile parts. The mass appears myxomatous. It is not obstructing flow.  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 A cardiac MRI is recommended for further evaluation.   MRI heart - poor quality  CTA chest 10/29/15 -  Changes consistent with the known history of left atrial myxoma Mild prominence of the ascending aorta as described. No other focal abnormality is seen.  Component     Latest Ref Rng 10/18/2015 10/31/2015  Cholesterol     0 - 200 mg/dL 141   Triglycerides     <150 mg/dL 78   HDL Cholesterol     >40 mg/dL 42   Total CHOL/HDL Ratio      3.4   VLDL     0 - 40 mg/dL 16   LDL (calc)     0 - 99 mg/dL 83   PTT Lupus Anticoagulant     0.0 - 40.6 sec 47.1 (H)   DRVVT     0.0 - 44.0 sec 38.3   Lupus Anticoag Interp      Comment:   Beta-2 Glycoprotein I Ab, IgG     0 - 20 GPI IgG units <9   Beta-2-Glycoprotein I IgM     0 - 32 GPI IgM units <9   Beta-2-Glycoprotein I IgA     0 - 25 GPI IgA units <9   Anticardiolipin Ab,IgG,Qn     0 - 14 GPL U/mL <9   Anticardiolipin Ab,IgM,Qn     0 - 12 MPL U/mL <  9   Anticardiolipin Ab,IgA,Qn     0 - 11 APL U/mL <9   Hemoglobin A1C     4.8 - 5.6 % 5.9 (H) 5.9 (H)  Mean Plasma Glucose      123 123  Recommendations-F5LEID:      Comment   Comment      Comment   Recommendations-PTGENE:      Comment   Additional Information      Comment   Antithrombin Activity     75 - 120 % 83   Protein C-Functional     73 - 180 % 101   Protein C, Total     60 - 150 % 75   Protein S-Functional     63 - 140 % 42 (L)   Protein S, Total     60 - 150 % 83   Homocysteine     0.0 - 15.0 umol/L 5.3   ANA Ab, IFA      Negative   CRP     <1.0 mg/dL <0.5   Sed Rate     0 - 22 mm/hr 16   ds DNA Ab     0 - 9 IU/mL 1   PTT-LA Mix     0.0 - 40.6 sec 43.4 (H)   Hexagonal Phase Phospholipid     0 - 11 sec 17 (H)     Assessment: As you may recall, she is a 65 y.o. African  American female with PMH of HTN, smoker was admitted on 10/18/15 for right-sided weakness. MRI showed bilateral punctate infarcts L>R, embolic pattern. CTA head and neck, TTE, LE venous doppler all negative. TEE done showed left atrial septum mobile lesion, suspicious for myxoma. CTA chest showed left atrial myxoma. LDL 83, and A1C 5.9. Hypercoagulable work up showed low protein S, positive HPP and slight positive lupus anticoagulant. Her stroke most likely due to left atrial myxoma. She was started on heparin drip and discharged on lovenox. Had myxoma resection surgery on 11/04/15. Postoperative course was complicated by blood loss anemia requiring transfusion. She was placed on Coumadin for stroke prophylaxis with a goal INR 2-2.5. She also developed several episodes of PAF with RVR with CHADS2-VASc=4 (female, HTN, CVA). She was placed on amiodarone but developed tachybradycardia syndrome which required permanent pacemaker implantation 11/14/15. Pt continued on aspirin and Coumadin with INR goal 2.0-2.5. Last INR 2.0. Patient continues smoking.  Plan:  - continue coumadin and Lipitor for stroke provention - from stroke standpoint, ASA is not needed at this time. Pt will discuss with cardiology on 01/10/16.  - check BP at home and avoid low BP - continue to follow up with cardiology and CT surgery Dr. Prescott Gum - Follow up with your primary care physician for stroke risk factor modification. Recommend maintain blood pressure goal <130/80, diabetes with hemoglobin A1c goal below 6.5% and lipids with LDL cholesterol goal below 70 mg/dL.  - quit smoking - follow up in 3 months.   I spent more than 25 minutes of face to face time with the patient. Greater than 50% of time was spent in counseling and coordination of care. We discussed about continuing the Coumadin use, follow-up with cardiology and CT surgery, and smoking cessation.   No orders of the defined types were placed in this encounter.    Meds  ordered this encounter  Medications  . SYMBICORT 160-4.5 MCG/ACT inhaler    Sig:   . SUMAtriptan (IMITREX) 50 MG tablet    Sig:   .  DISCONTD: amLODipine-benazepril (LOTREL) 10-40 MG capsule    Sig:     Patient Instructions  - continue coumadin for afib and stroke provention - continue lipitor for stroke prevention - from stroke standpoint, ASA is not needed at this time. Please discuss with your cardiology on 01/10/16.  - check BP at home and avoid low BP - continue to follow up with cardiology and CT surgery Dr. Prescott Gum - Follow up with your primary care physician for stroke risk factor modification. Recommend maintain blood pressure goal <130/80, diabetes with hemoglobin A1c goal below 6.5% and lipids with LDL cholesterol goal below 70 mg/dL.  - try ensure to increase appetite.  - quit smoking - follow up in 3 months.    Rosalin Hawking, MD PhD [facility] [address] [place], [place] [address] [phone]

## 2016-01-08 NOTE — Patient Instructions (Addendum)
-   continue coumadin for afib and stroke provention - continue lipitor for stroke prevention - from stroke standpoint, ASA is not needed at this time. Please discuss with your cardiology on 01/10/16.  - check BP at home and avoid low BP - continue to follow up with cardiology and CT surgery Dr. Prescott Gum - Follow up with your primary care physician for stroke risk factor modification. Recommend maintain blood pressure goal <130/80, diabetes with hemoglobin A1c goal below 6.5% and lipids with LDL cholesterol goal below 70 mg/dL.  - try ensure to increase appetite.  - quit smoking - follow up in 3 months.

## 2016-01-10 ENCOUNTER — Ambulatory Visit (INDEPENDENT_AMBULATORY_CARE_PROVIDER_SITE_OTHER): Payer: Medicare Other | Admitting: *Deleted

## 2016-01-10 ENCOUNTER — Encounter: Payer: Self-pay | Admitting: Cardiology

## 2016-01-10 ENCOUNTER — Ambulatory Visit (INDEPENDENT_AMBULATORY_CARE_PROVIDER_SITE_OTHER): Payer: Medicare Other | Admitting: Cardiology

## 2016-01-10 ENCOUNTER — Other Ambulatory Visit (INDEPENDENT_AMBULATORY_CARE_PROVIDER_SITE_OTHER): Payer: Medicare Other

## 2016-01-10 VITALS — BP 100/62 | HR 76 | Ht 61.0 in | Wt 169.0 lb

## 2016-01-10 DIAGNOSIS — I6340 Cerebral infarction due to embolism of unspecified cerebral artery: Secondary | ICD-10-CM

## 2016-01-10 DIAGNOSIS — E785 Hyperlipidemia, unspecified: Secondary | ICD-10-CM

## 2016-01-10 DIAGNOSIS — I480 Paroxysmal atrial fibrillation: Secondary | ICD-10-CM | POA: Diagnosis not present

## 2016-01-10 DIAGNOSIS — Z79899 Other long term (current) drug therapy: Secondary | ICD-10-CM

## 2016-01-10 DIAGNOSIS — D151 Benign neoplasm of heart: Secondary | ICD-10-CM | POA: Diagnosis not present

## 2016-01-10 DIAGNOSIS — I10 Essential (primary) hypertension: Secondary | ICD-10-CM

## 2016-01-10 DIAGNOSIS — E876 Hypokalemia: Secondary | ICD-10-CM

## 2016-01-10 LAB — POCT INR: INR: 5.8

## 2016-01-10 LAB — TSH: TSH: 2.08 m[IU]/L

## 2016-01-10 NOTE — Patient Instructions (Signed)
Medication Instructions:  STOP AMIODARONE  Labwork: DONE Testing/Procedures: NONE  Follow-Up: Your physician recommends that you schedule a follow-up appointment in: 3 MONTHS WITH  DR  Meda Coffee   Any Other Special Instructions Will Be Listed Below (If Applicable).     If you need a refill on your cardiac medications before your next appointment, please call your pharmacy.

## 2016-01-10 NOTE — Progress Notes (Signed)
Patient ID: Julia Schwartz, female   DOB: 1951-08-18, 65 y.o.   MRN: [id number]    Cardiology Office Note:    Date:  01/10/2016   ID:  Julia Schwartz, DOB 11/24/1950, MRN [id number]  PCP:  [name], MD  Cardiologist:  Dr. Ena Dawley   Electrophysiologist:  Dr. Virl Axe   Chief complain: Posthospitalization follow-up  History of Present Illness:     Julia Schwartz is a 65 y.o. 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 TEE demonstrated large atrial myxoma. LHC demonstrated normal coronary arteries. Patient was seen by Dr. Prescott Gum who recommended surgical resection. 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 Postoperative course was complicated by blood loss anemia. She required transfusion with 2 units PRBCs. She was placed on Coumadin for stroke prophylaxis with a goal INR 2-2.5. She also developed several episodes of PAF with RVR. CHADS2-VASc=4 (female, HTN, CVA). She was placed on amiodarone and developed tachybradycardia syndrome. She further required permanent pacemaker implantation 11/14/15 with Dr. Caryl Comes.  01/10/2016  - She returns for follow-up. She has been complaining to her medications and denies any bleeding with Coumadin. She also denies any chest pain or dyspnea. No fever or chills, her sternotomy wound is healing very well. No tenderness or discharge around the pacemaker site. She denies any palpitations or syncope. She complains of lower extremity edema that gets worse toward the end of the day but not every day. She continues taking Lasix 20 mg daily. She has home health physical and occupational  therapy.   Past Medical History  Diagnosis Date  . Hypertension   . Anxiety   . Depression   . Arthritis   . GERD (gastroesophageal reflux disease)   . Chronic back pain   . Wears dentures      top-partial bottom  . Wears glasses   . Fibromyalgia   . Stroke [facility])     TIA came to er on 1/3  some deficits in right hand  . Hepatitis     hep  c    dx 2007  . Atrial myxoma     a. 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 Echo 2/17:  Mild focal basal septal hypertrophy, EF 50-55%, no RWMA, Gr 1 DD, trivial MR, normal RVF, mild TR, pericardial patch in interatrial septum patent - no residual flow    Past Surgical History  Procedure Laterality Date  . Submandibular gland excision      left  . Abdominal hysterectomy    . Tonsillectomy    . Bunionectomy      both feet  . Fracture surgery      fx lt ankle  . Tee without cardioversion N/A 10/18/2015    Procedure: TRANSESOPHAGEAL ECHOCARDIOGRAM (TEE);  Surgeon: Cheyrl Spark, MD;  Location: [facility] ENDOSCOPY;  Service: Cardiovascular;  Laterality: N/A;  . Cardiac catheterization N/A 10/23/2015    Procedure: Left Heart Cath and Coronary Angiography;  Surgeon: Belva Crome, MD;  Location: [place] CV LAB;  Service: Cardiovascular;  Laterality: N/A;  . Excision of atrial myxoma N/A 11/04/2015    Procedure: RESECTION OF LEFT ATRIAL MYXOMA, BI-ATRIAL APPROACH WITH PERICARDIAL PATCH;  Surgeon: Ivin Poot, MD;  Location: [place];  Service: Open Heart Surgery;  Laterality: N/A;  . Tee without cardioversion N/A 11/04/2015    Procedure: TRANSESOPHAGEAL ECHOCARDIOGRAM (TEE);  Surgeon: Ivin Poot, MD;  Location: [place];  Service: Open Heart Surgery;  Laterality: N/A;  . Ep implantable device N/A 11/14/2015    Procedure: Pacemaker Implant;  Surgeon: Deboraha Sprang, MD;  Location: [place] CV LAB;  Service: Cardiovascular;  Laterality: N/A;    Current Medications: Outpatient Prescriptions Prior to Visit  Medication Sig Dispense Refill  . amiodarone (PACERONE) 200 MG tablet Take 1 tablet (200 mg total) by mouth daily. 30 tablet 1  . aspirin EC 81 MG EC tablet Take 1 tablet (81 mg total) by mouth daily.    Marland Kitchen atorvastatin (LIPITOR) 20 MG tablet Take 1  tablet (20 mg total) by mouth daily at 6 PM. 30 tablet 0  . busPIRone (BUSPAR) 15 MG tablet Take 15 mg by mouth 3 (three) times daily.     . DULoxetine (CYMBALTA) 60 MG capsule Take 60 mg by mouth daily.    . furosemide (LASIX) 20 MG tablet Take 20 mg by mouth daily.    . metoprolol tartrate (LOPRESSOR) 25 MG tablet Take 1 tablet (25 mg total) by mouth 2 (two) times daily. 60 tablet 1  . mirtazapine (REMERON) 15 MG tablet Take 15 mg by mouth at bedtime.    Marland Kitchen omeprazole (PRILOSEC) 40 MG capsule Take 1 capsule (40 mg total) by mouth daily. 30 capsule 0  . pregabalin (LYRICA) 75 MG capsule Take 75-150 mg by mouth 2 (two) times daily. 150mg  in the morning and 75mg  in the evening    . SUMAtriptan (IMITREX) 50 MG tablet     . SYMBICORT 160-4.5 MCG/ACT inhaler     . tiZANidine (ZANAFLEX) 4 MG capsule Take 4 mg by mouth 3 (three) times daily as needed for muscle spasms.     Marland Kitchen warfarin (COUMADIN) 5 MG tablet Take 5 mg by mouth daily. PATIENT WAS INSTRUCTED TO TAKE 1/2 TABLET BY MOUTH ON Tuesday, Thursday, AND Saturday AND WHOLE TABLET BY MOUTH ON Monday, Wednesday, Friday and Sunday.    . zolpidem (AMBIEN) 10 MG tablet Take 10 mg by mouth at bedtime.      No facility-administered medications prior to visit.     Allergies:   Ultram   Social History   Social History  . Marital Status: Single    Spouse Name: N/A  . Number of Children: 5  . Years of Education: 12   Occupational History  .      retired   Social History Main Topics  . Smoking status: Current Every Day Smoker -- 0.25 packs/day for 10 years    Types: Cigarettes  . Smokeless tobacco: None  . Alcohol Use: No  . Drug Use: No  . Sexual Activity: Not Asked   Other Topics Concern  . None   Social History Narrative   Lives with family    Family History:  The patient's family history includes COPD in her mother; Diabetes in her maternal grandmother.   ROS:   Please see the history of present illness.    Review of Systems    HENT: Positive for headaches.   Cardiovascular: Positive for chest pain.  Musculoskeletal: Positive for back pain.  Gastrointestinal: Positive for constipation.  Psychiatric/Behavioral: Positive for depression.  All other systems reviewed and are negative.   Physical Exam:    VS:  BP 100/62 mmHg  Pulse 76  Ht 5\' 1"  (1.549 m)  Wt 169 lb (76.658 kg)  BMI 31.95 kg/m2   GEN: Well nourished, well developed, in no acute distress HEENT:  normal Neck: no JVD, no masses Cardiac: Normal S1/S2, RRR; no murmurs, no edema Chest: Median sternotomy well-healed without erythema or discharge; pacer site has no sign of erythema. Respiratory:  clear to auscultation bilaterally; no wheezing, rhonchi or rales GI: soft, nontender MS: no deformity or atrophy Skin: warm and dry, no rash Neuro:  no focal deficits  Psych: Alert and oriented x 3, normal affect  Wt Readings from Last 3 Encounters:  01/10/16 169 lb (76.658 kg)  01/08/16 169 lb (76.658 kg)  12/04/15 158 lb (71.668 kg)      Studies/Labs Reviewed:     EKG:  EKG is  ordered today.  The ekg ordered today demonstrates NSR, HR 77, normal axis, nonspecific ST-T wave changes, QTC 353 ms, no change from prior tracing  Recent Labs: 11/05/2015: Magnesium 1.9 11/12/2015: ALT 24 11/15/2015: Hemoglobin 12.0; Platelets 329 11/21/2015: BUN 12; Creat 0.97; Potassium 4.0; Sodium 137   Recent Lipid Panel    Component Value Date/Time   CHOL 141 10/18/2015 0539   TRIG 78 10/18/2015 0539   HDL 42 10/18/2015 0539   CHOLHDL 3.4 10/18/2015 0539   VLDL 16 10/18/2015 0539   LDLCALC 83 10/18/2015 0539    Additional studies/ records that were reviewed today include:   Carotid US 10/24/15 No evidence of stenosis in bilateral ICA  LHC 10/23/15 Normal coronary arteries  TEE 10/18/15 EF 55-60%, normal wall motion, mild MR, LA with large mobile density attached to the superioro-anterior portion of the interatrial septum measuring 27 x 29 mm, no LAA  clot  Echo 10/18/15 Mild focal basal septal hypertrophy, EF 55%,? Mass in LA   ASSESSMENT/PLAN:     1. L Atrial Myxoma s/p resection - Overall, she is doing well. Follow-up with Dr. Nils Pyle. Follow-up echo showed no residual tumor and preserved LVEF.  2. PAF - Maintaining NSR.  AF occurred post myxoma resection. Today she is still in sinus rhythm, we will discontinue amiodarone, she is advised to contact us if she feels palpitation or tachycardia. Considering she had a stroke and her CHADS2-VASc=4 continue chronic anticoagulation with Coumadin. We will set the nuchal of INR 2-3 now.  Today's INR 5.8 Coumadin clinic will adjust her dose.   3. S/p Pacer - Secondary to tachy-brady syndrome. She is asymptomatic no signs of infection or malfunction. Normal function of pacemaker on the last checkup.  4. HTN - Controlled.    5. HL - Now on Lipitor.  Arrange FU Lipids and LFTs in 6 weeks.   6. S/p Embolic CVA - FU with Neuro as planned.   7. Tobacco abuse - She has quit smoking.     8. Lower extremity edema - she is advised to take an extra Lasix 20 in early afternoon on the days when her lower extremity edema is worse.  We will check CMP, CBC, INR today follow-up in 3 months.   Signed, Milayna Spark, MD  01/10/2016 10:09 AM    [facility] [place], [place], [place]  [address] Phone: [phone]; Fax: [phone]

## 2016-01-13 ENCOUNTER — Telehealth: Payer: Self-pay | Admitting: *Deleted

## 2016-01-13 ENCOUNTER — Ambulatory Visit (INDEPENDENT_AMBULATORY_CARE_PROVIDER_SITE_OTHER): Payer: Medicare Other | Admitting: Pharmacist

## 2016-01-13 DIAGNOSIS — I10 Essential (primary) hypertension: Secondary | ICD-10-CM

## 2016-01-13 DIAGNOSIS — E785 Hyperlipidemia, unspecified: Secondary | ICD-10-CM

## 2016-01-13 LAB — POCT INR: INR: 1.2

## 2016-01-13 NOTE — Telephone Encounter (Signed)
Pt notified of lab results and findings by phone w/verbal understanding. Pt answered yes to both lab fasting and taking lipitor 20 mg. States no to ETOH then states if "I do have a beer it takes me 2 days to drink it". I will d/w PA for further advice.

## 2016-01-14 ENCOUNTER — Other Ambulatory Visit: Payer: Self-pay | Admitting: Physician Assistant

## 2016-01-14 ENCOUNTER — Ambulatory Visit: Payer: Medicare Other | Admitting: Cardiothoracic Surgery

## 2016-01-14 MED ORDER — ATORVASTATIN CALCIUM 40 MG PO TABS: 40.0000 mg | ORAL_TABLET | Freq: Every day | ORAL | Status: DC

## 2016-01-14 NOTE — Telephone Encounter (Signed)
No answer

## 2016-01-14 NOTE — Telephone Encounter (Signed)
Lmtcb to go over recommednations to increase lipitor to 40 mg daily; w/repeat labs 8-12 weeks

## 2016-01-15 ENCOUNTER — Ambulatory Visit: Payer: Medicare Other | Admitting: Cardiothoracic Surgery

## 2016-01-15 LAB — LIPID PANEL
CHOL/HDL RATIO: 3.1 ratio (ref <=5.0)
Cholesterol: 140 mg/dL (ref 125–200)
HDL: 45 mg/dL — ABNORMAL LOW (ref >=46)
LDL CALC: 68 mg/dL (ref <130)
Triglycerides: 137 mg/dL (ref <150)
VLDL: 27 mg/dL (ref <30)

## 2016-01-15 LAB — HEPATIC FUNCTION PANEL
ALT: 26 U/L (ref 6–29)
AST: 54 U/L — AB (ref 10–35)
Albumin: 3.7 g/dL (ref 3.6–5.1)
Alkaline Phosphatase: 44 U/L (ref 33–130)
BILIRUBIN DIRECT: 0.1 mg/dL (ref <=0.2)
BILIRUBIN TOTAL: 0.2 mg/dL (ref 0.2–1.2)
Indirect Bilirubin: 0.1 mg/dL — ABNORMAL LOW (ref 0.2–1.2)
Total Protein: 6.9 g/dL (ref 6.1–8.1)

## 2016-01-16 ENCOUNTER — Other Ambulatory Visit: Payer: Self-pay | Admitting: Physician Assistant

## 2016-01-16 MED ORDER — METOPROLOL TARTRATE 25 MG PO TABS: 25.0000 mg | ORAL_TABLET | Freq: Two times a day (BID) | ORAL | Status: DC

## 2016-01-16 NOTE — Telephone Encounter (Signed)
Ptcb and has been notified of increase liptor 40 mg daily, 6/21 LFT/FLP ,  Dr. Meda Coffee on 6/22. Pt asked for a refill on her Metoprolol. New Rx for lipitor 40 mg .Marland KitchenMarland KitchenPt agreeable to plan of care.

## 2016-01-17 ENCOUNTER — Ambulatory Visit (INDEPENDENT_AMBULATORY_CARE_PROVIDER_SITE_OTHER): Payer: Self-pay | Admitting: Cardiothoracic Surgery

## 2016-01-17 ENCOUNTER — Encounter: Payer: Self-pay | Admitting: Cardiothoracic Surgery

## 2016-01-17 ENCOUNTER — Telehealth: Payer: Self-pay | Admitting: *Deleted

## 2016-01-17 VITALS — BP 117/84 | HR 78 | Resp 16 | Ht 61.0 in | Wt 169.0 lb

## 2016-01-17 DIAGNOSIS — D151 Benign neoplasm of heart: Secondary | ICD-10-CM

## 2016-01-17 DIAGNOSIS — E785 Hyperlipidemia, unspecified: Secondary | ICD-10-CM

## 2016-01-17 DIAGNOSIS — Z09 Encounter for follow-up examination after completed treatment for conditions other than malignant neoplasm: Secondary | ICD-10-CM

## 2016-01-17 MED ORDER — ATORVASTATIN CALCIUM 40 MG PO TABS: 20.0000 mg | ORAL_TABLET | Freq: Every day | ORAL | Status: DC

## 2016-01-17 NOTE — Progress Notes (Signed)
PCP is Julia Junior, MD Referring Provider is Latrisha Spark, MD  Chief Complaint  Patient presents with  . Routine Post Op    6 wk f/u   Patient examined, postoperative 2-D echocardiogram reviewed HPI: 65 year old AA female returns for retained postop visit almost 2 months  after Resection of left atrial myxoma. 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 She is doing well. She is without symptoms of heart failure. The patient's neurologic deficit, right hand weakness ./preoperative stroke from myxoma is better but not completely resolved.the patient still smokes up to 5 cigarettes daily. She continues Coumadin for the atrial myxoma resection. I feel she should be able to stop the Coumadin one  year after surgery as long as she maintains a sinus rhythm.a postop echocardiogram was performed that shows no residual myxoma, good biventricular function, no mitral regurgitation. Past Medical History  Diagnosis Date  . Hypertension   . Anxiety   . Depression   . Arthritis   . GERD (gastroesophageal reflux disease)   . Chronic back pain   . Wears dentures     top-partial bottom  . Wears glasses   . Fibromyalgia   . Stroke [facility])     TIA came to er on 1/3  some deficits in right hand  . Hepatitis     hep  c    dx 2007  . Atrial myxoma     a. 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 Echo 2/17:  Mild focal basal septal hypertrophy, EF 50-55%, no RWMA, Gr 1 DD, trivial MR, normal RVF, mild TR, pericardial patch in interatrial septum patent - no residual flow    Past Surgical History  Procedure Laterality Date  . Submandibular gland excision      left  . Abdominal hysterectomy    . Tonsillectomy    . Bunionectomy      both feet  . Fracture surgery      fx lt ankle  . Tee without cardioversion N/A 10/18/2015    Procedure: TRANSESOPHAGEAL ECHOCARDIOGRAM (TEE);  Surgeon: Lilliauna Spark, MD;  Location: [facility] ENDOSCOPY;  Service:  Cardiovascular;  Laterality: N/A;  . Cardiac catheterization N/A 10/23/2015    Procedure: Left Heart Cath and Coronary Angiography;  Surgeon: Julia Crome, MD;  Location: [place] CV LAB;  Service: Cardiovascular;  Laterality: N/A;  . Excision of atrial myxoma N/A 11/04/2015    Procedure: RESECTION OF LEFT ATRIAL MYXOMA, BI-ATRIAL APPROACH WITH PERICARDIAL PATCH;  Surgeon: Julia Poot, MD;  Location: [place];  Service: Open Heart Surgery;  Laterality: N/A;  . Tee without cardioversion N/A 11/04/2015    Procedure: TRANSESOPHAGEAL ECHOCARDIOGRAM (TEE);  Surgeon: Julia Poot, MD;  Location: [place];  Service: Open Heart Surgery;  Laterality: N/A;  . Ep implantable device N/A 11/14/2015    Procedure: Pacemaker Implant;  Surgeon: Julia Sprang, MD;  Location: [place] CV LAB;  Service: Cardiovascular;  Laterality: N/A;    Family History  Problem Relation Age of Onset  . COPD Mother   . Diabetes Maternal Grandmother     Social History Social History  Substance Use Topics  . Smoking status: Current Every Day Smoker -- 0.25 packs/day for 10 years    Types: Cigarettes  . Smokeless tobacco: None  . Alcohol Use: No    Current Outpatient Prescriptions  Medication Sig Dispense Refill  . aspirin EC 81 MG EC tablet Take 1 tablet (81 mg total) by mouth daily.    Marland Kitchen  atorvastatin (LIPITOR) 40 MG tablet Take 0.5 tablets (20 mg total) by mouth daily at 6 PM.    . busPIRone (BUSPAR) 15 MG tablet Take 15 mg by mouth 3 (three) times daily.     . DULoxetine (CYMBALTA) 60 MG capsule Take 60 mg by mouth daily.    . furosemide (LASIX) 20 MG tablet Take 20 mg by mouth daily.    . metoprolol tartrate (LOPRESSOR) 25 MG tablet Take 1 tablet (25 mg total) by mouth 2 (two) times daily. 60 tablet 1  . mirtazapine (REMERON) 15 MG tablet Take 15 mg by mouth at bedtime.    Marland Kitchen omeprazole (PRILOSEC) 40 MG capsule Take 1 capsule (40 mg total) by mouth daily. 30 capsule 0  . pregabalin (LYRICA) 75 MG capsule Take  75-150 mg by mouth 2 (two) times daily. 150mg  in the morning and 75mg  in the evening    . SUMAtriptan (IMITREX) 50 MG tablet     . SYMBICORT 160-4.5 MCG/ACT inhaler     . tiZANidine (ZANAFLEX) 4 MG capsule Take 4 mg by mouth 3 (three) times daily as needed for muscle spasms.     Marland Kitchen warfarin (COUMADIN) 5 MG tablet Take 5 mg by mouth daily. PATIENT WAS INSTRUCTED TO TAKE 1/2 TABLET BY MOUTH ON Tuesday, Thursday, AND Saturday AND WHOLE TABLET BY MOUTH ON Monday, Wednesday, Friday and Sunday.    . zolpidem (AMBIEN) 10 MG tablet Take 10 mg by mouth at bedtime.      No current facility-administered medications for this visit.    Allergies  Allergen Reactions  . Ultram [Tramadol] Hives    Review of Systems  Patient is able to do well her normal daily activities without problem No significant bleeding problems from her Coumadin She is receiving regular INR checks No chest pain or shortness of breath  BP 117/84 mmHg  Pulse 78  Resp 16  Ht 5\' 1"  (1.549 m)  Wt 169 lb (76.658 kg)  BMI 31.95 kg/m2  SpO2 96% Physical Exam Alert and comfortable Lungs clear Heart rhythm regular murmur Good peripheral pulses Neuro without focal motor deficit  Diagnostic Tests: Postop echocardiogram personally reviewed showing good biventricular function, successful resection of left atrial myxoma  Impression: Continue Coumadin Repeat echocardiogram one year after surgery at which time I will review the patient Plan:return December 2017 with echocardiogram. Will discuss the need for Coumadin that time.   Julia Childs, MD [facility] [phone]

## 2016-01-17 NOTE — Telephone Encounter (Signed)
I had s/w pt the other day about her lab results and that her trigs where high and wasadvised at that time to increase lipitor to 40 mg daily. Pt advised today lab error and trigs were fine. Advised per Brynda Rim. PA if she already picked up the liptor 40 mg then she could go ahead and cut them in half and to continue on her lipitor 20 mg daily. Pt said thank you for the call today and verbalized understanding to plan of care.

## 2016-01-17 NOTE — Addendum Note (Signed)
Addended by: Michae Kava on: 01/17/2016 10:51 AM   Modules accepted: Orders

## 2016-01-20 ENCOUNTER — Ambulatory Visit (INDEPENDENT_AMBULATORY_CARE_PROVIDER_SITE_OTHER): Payer: Medicare Other

## 2016-01-20 LAB — POCT INR: INR: 1.5

## 2016-01-27 ENCOUNTER — Ambulatory Visit (INDEPENDENT_AMBULATORY_CARE_PROVIDER_SITE_OTHER): Payer: Medicare Other

## 2016-01-27 LAB — POCT INR: INR: 1.9

## 2016-01-27 MED ORDER — WARFARIN SODIUM 5 MG PO TABS: ORAL_TABLET | ORAL | Status: DC

## 2016-02-03 ENCOUNTER — Ambulatory Visit (INDEPENDENT_AMBULATORY_CARE_PROVIDER_SITE_OTHER): Payer: Medicare Other | Admitting: Cardiovascular Disease

## 2016-02-03 LAB — POCT INR: INR: 3

## 2016-02-10 ENCOUNTER — Ambulatory Visit (INDEPENDENT_AMBULATORY_CARE_PROVIDER_SITE_OTHER): Payer: Medicare Other | Admitting: Cardiology

## 2016-02-10 LAB — POCT INR: INR: 2.9

## 2016-02-14 ENCOUNTER — Other Ambulatory Visit: Payer: Self-pay | Admitting: Physician Assistant

## 2016-02-20 ENCOUNTER — Ambulatory Visit (INDEPENDENT_AMBULATORY_CARE_PROVIDER_SITE_OTHER): Payer: Medicare Other

## 2016-02-20 LAB — POCT INR: INR: 3.4

## 2016-02-21 ENCOUNTER — Encounter: Payer: Self-pay | Admitting: Cardiology

## 2016-02-25 ENCOUNTER — Encounter: Payer: Self-pay | Admitting: Internal Medicine

## 2016-02-25 ENCOUNTER — Ambulatory Visit (INDEPENDENT_AMBULATORY_CARE_PROVIDER_SITE_OTHER): Payer: Medicare Other | Admitting: Internal Medicine

## 2016-02-25 VITALS — BP 124/85 | HR 80 | Ht 61.0 in | Wt 162.0 lb

## 2016-02-25 DIAGNOSIS — Z950 Presence of cardiac pacemaker: Secondary | ICD-10-CM

## 2016-02-25 DIAGNOSIS — I495 Sick sinus syndrome: Secondary | ICD-10-CM

## 2016-02-25 DIAGNOSIS — I480 Paroxysmal atrial fibrillation: Secondary | ICD-10-CM

## 2016-02-25 NOTE — Patient Instructions (Signed)
Your physician recommends that you continue on your current medications as directed. Please refer to the Current Medication list given to you today.  Remote monitoring is used to monitor your Pacemaker of ICD from home. This monitoring reduces the number of office visits required to check your device to one time per year. It allows [place] to keep an eye on the functioning of your device to ensure it is working properly. You are scheduled for a device check from home on 05/26/16. You may send your transmission at any time that day. If you have a wireless device, the transmission will be sent automatically. After your physician reviews your transmission, you will receive a postcard with your next transmission date.  Your physician wants you to follow-up in: 9 months with Chanetta Marshall, NP.   You will receive a reminder letter in the mail two months in advance. If you don't receive a letter, please call our office to schedule the follow-up appointment.  Dr. Olin Pia nurse will call you regarding whether or not you can come off aspirin.

## 2016-02-25 NOTE — Progress Notes (Signed)
Patient Care Team: [name], MD as PCP - General (Family Medicine)   HPI  Julia Schwartz is a 65 y.o. female Seen in followup for pacemaker implanted 1/17 for sinus node dysfunction maninfesting after surgery for atrial myxoma resection;  She also had post op Afib RVR cx by CVA.  Antecedent history of syncope  No recurrent syncope; prev 1-2/month  Incision still hurts  SOB better  She is treated with aspirin and warfarin  Records and Results Reviewed   Notes from DR KN and PvT  Past Medical History  Diagnosis Date  . Hypertension   . Anxiety   . Depression   . Arthritis   . GERD (gastroesophageal reflux disease)   . Chronic back pain   . Wears dentures     top-partial bottom  . Wears glasses   . Fibromyalgia   . Stroke [facility])     TIA came to er on 1/3  some deficits in right hand  . Hepatitis     hep  c    dx 2007  . Atrial myxoma     a. 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 Echo 2/17:  Mild focal basal septal hypertrophy, EF 50-55%, no RWMA, Gr 1 DD, trivial MR, normal RVF, mild TR, pericardial patch in interatrial septum patent - no residual flow    Past Surgical History  Procedure Laterality Date  . Submandibular gland excision      left  . Abdominal hysterectomy    . Tonsillectomy    . Bunionectomy      both feet  . Fracture surgery      fx lt ankle  . Tee without cardioversion N/A 10/18/2015    Procedure: TRANSESOPHAGEAL ECHOCARDIOGRAM (TEE);  Surgeon: Yitzel Spark, MD;  Location: [facility] ENDOSCOPY;  Service: Cardiovascular;  Laterality: N/A;  . Cardiac catheterization N/A 10/23/2015    Procedure: Left Heart Cath and Coronary Angiography;  Surgeon: Belva Crome, MD;  Location: [place] CV LAB;  Service: Cardiovascular;  Laterality: N/A;  . Excision of atrial myxoma N/A 11/04/2015    Procedure: RESECTION OF LEFT ATRIAL MYXOMA, BI-ATRIAL APPROACH WITH PERICARDIAL PATCH;  Surgeon: Ivin Poot, MD;  Location: [place];  Service: Open Heart Surgery;   Laterality: N/A;  . Tee without cardioversion N/A 11/04/2015    Procedure: TRANSESOPHAGEAL ECHOCARDIOGRAM (TEE);  Surgeon: Ivin Poot, MD;  Location: [place];  Service: Open Heart Surgery;  Laterality: N/A;  . Ep implantable device N/A 11/14/2015    Procedure: Pacemaker Implant;  Surgeon: Deboraha Sprang, MD;  Location: [place] CV LAB;  Service: Cardiovascular;  Laterality: N/A;    Current Outpatient Prescriptions  Medication Sig Dispense Refill  . aspirin EC 81 MG EC tablet Take 1 tablet (81 mg total) by mouth daily.    Marland Kitchen atorvastatin (LIPITOR) 40 MG tablet Take 0.5 tablets (20 mg total) by mouth daily at 6 PM.    . busPIRone (BUSPAR) 15 MG tablet Take 15 mg by mouth 3 (three) times daily.     . furosemide (LASIX) 20 MG tablet Take 20 mg by mouth daily.    . metoprolol tartrate (LOPRESSOR) 25 MG tablet TAKE 1 TABLET(25 MG) BY MOUTH TWICE DAILY 60 tablet 10  . mirtazapine (REMERON) 15 MG tablet Take 15 mg by mouth at bedtime.    Marland Kitchen omeprazole (PRILOSEC) 40 MG capsule Take 1 capsule (40 mg total) by mouth daily. 30 capsule 0  . pregabalin (LYRICA) 75 MG capsule Take  75-150 mg by mouth 2 (two) times daily. 150mg  in the morning and 75mg  in the evening    . SUMAtriptan (IMITREX) 50 MG tablet Take 50 mg by mouth as needed for migraine.     . SYMBICORT 160-4.5 MCG/ACT inhaler Inhale 2 puffs into the lungs as needed.     Marland Kitchen tiZANidine (ZANAFLEX) 4 MG capsule Take 4 mg by mouth 3 (three) times daily as needed for muscle spasms.     Marland Kitchen warfarin (COUMADIN) 5 MG tablet Take as directed by [facility] 35 tablet 1  . zolpidem (AMBIEN) 10 MG tablet Take 10 mg by mouth at bedtime.      No current facility-administered medications for this visit.    Allergies  Allergen Reactions  . Ultram [Tramadol] Hives      Review of Systems negative except from HPI and PMH  Physical Exam BP 124/85 mmHg  Pulse 80  Ht 5\' 1"  (1.549 m)  Wt 162 lb (73.483 kg)  BMI 30.63 kg/m2 Well developed and well  nourished in no acute distress HENT normal E scleral and icterus clear Neck Supple JVP flat; carotids brisk and full Clear to ausculation Device pocket well healed; without hematoma or erythema.  There is no tethering Regular rate and rhythm, no murmurs gallops or rub Soft with active bowel sounds No clubbing cyanosis  Edema Alert and oriented, grossly normal motor and sensory function Skin Warm and Dry  ECG dated today demonstrates sinus rhythm at 81 Intervals 13/09/41  Assessment and  Plan  Sinus node dysfunction  Syncope  Atrial fibrillation-paroxysmal  Atrial myxoma status post resection  Pacemaker-[employer]   The patient's device was interrogated and reprogrammed.There is been no significant interval atrial fibrillation  No recurrent syncope  She is on aspirin and Coumadin; I reached Dr. Milbert Coulter. Meda Coffee and [employer], not sure what is going on but in tears

## 2016-02-27 ENCOUNTER — Ambulatory Visit (INDEPENDENT_AMBULATORY_CARE_PROVIDER_SITE_OTHER): Payer: Medicare Other | Admitting: Cardiovascular Disease

## 2016-02-27 LAB — POCT INR: INR: 2.4

## 2016-03-03 LAB — CUP PACEART INCLINIC DEVICE CHECK
Battery Voltage: 3.05 V
Brady Statistic AS VP Percent: 0.03 %
Brady Statistic AS VS Percent: 97.01 %
Implantable Lead Implant Date: 20170126
Lead Channel Impedance Value: 399 Ohm
Lead Channel Impedance Value: 456 Ohm
Lead Channel Pacing Threshold Amplitude: 0.375 V
Lead Channel Pacing Threshold Pulse Width: 0.4 ms
Lead Channel Sensing Intrinsic Amplitude: 11.5 mV
Lead Channel Setting Pacing Amplitude: 2 V
Lead Channel Setting Sensing Sensitivity: 2.8 mV
MDC IDC LEAD IMPLANT DT: 20170126
MDC IDC LEAD LOCATION: 753859
MDC IDC LEAD LOCATION: 753860
MDC IDC MSMT BATTERY REMAINING LONGEVITY: 127 mo
MDC IDC MSMT LEADCHNL RA IMPEDANCE VALUE: 342 Ohm
MDC IDC MSMT LEADCHNL RA SENSING INTR AMPL: 0.625 mV
MDC IDC MSMT LEADCHNL RV IMPEDANCE VALUE: 532 Ohm
MDC IDC MSMT LEADCHNL RV PACING THRESHOLD AMPLITUDE: 0.625 V
MDC IDC MSMT LEADCHNL RV PACING THRESHOLD PULSEWIDTH: 0.4 ms
MDC IDC SESS DTM: 20170509172710
MDC IDC SET LEADCHNL RV PACING AMPLITUDE: 2 V
MDC IDC SET LEADCHNL RV PACING PULSEWIDTH: 0.4 ms
MDC IDC STAT BRADY AP VP PERCENT: 0.01 %
MDC IDC STAT BRADY AP VS PERCENT: 2.95 %
MDC IDC STAT BRADY RA PERCENT PACED: 2.96 %
MDC IDC STAT BRADY RV PERCENT PACED: 0.04 %

## 2016-03-05 ENCOUNTER — Ambulatory Visit (INDEPENDENT_AMBULATORY_CARE_PROVIDER_SITE_OTHER): Payer: Medicare Other | Admitting: Cardiology

## 2016-03-05 LAB — POCT INR: INR: 1.9

## 2016-03-12 ENCOUNTER — Ambulatory Visit (INDEPENDENT_AMBULATORY_CARE_PROVIDER_SITE_OTHER): Payer: Medicare Other | Admitting: Cardiovascular Disease

## 2016-03-12 LAB — POCT INR: INR: 2.8

## 2016-03-26 ENCOUNTER — Ambulatory Visit (INDEPENDENT_AMBULATORY_CARE_PROVIDER_SITE_OTHER): Payer: Medicare Other | Admitting: Interventional Cardiology

## 2016-03-26 LAB — POCT INR: INR: 4.6

## 2016-04-01 ENCOUNTER — Other Ambulatory Visit: Payer: Self-pay | Admitting: Cardiology

## 2016-04-02 ENCOUNTER — Ambulatory Visit (INDEPENDENT_AMBULATORY_CARE_PROVIDER_SITE_OTHER): Payer: Medicare Other | Admitting: Pharmacist

## 2016-04-02 LAB — POCT INR: INR: 2

## 2016-04-09 ENCOUNTER — Ambulatory Visit: Payer: Medicare Other | Admitting: Cardiology

## 2016-04-09 ENCOUNTER — Ambulatory Visit (INDEPENDENT_AMBULATORY_CARE_PROVIDER_SITE_OTHER): Payer: Medicare Other | Admitting: Internal Medicine

## 2016-04-09 LAB — POCT INR: INR: 1.4

## 2016-04-16 ENCOUNTER — Ambulatory Visit (INDEPENDENT_AMBULATORY_CARE_PROVIDER_SITE_OTHER): Payer: Medicare Other | Admitting: Interventional Cardiology

## 2016-04-16 LAB — POCT INR: INR: 1.8

## 2016-04-28 ENCOUNTER — Encounter: Payer: Self-pay | Admitting: Neurology

## 2016-04-28 ENCOUNTER — Ambulatory Visit (INDEPENDENT_AMBULATORY_CARE_PROVIDER_SITE_OTHER): Payer: Medicare Other | Admitting: Neurology

## 2016-04-28 VITALS — BP 145/70 | HR 65 | Ht 61.0 in | Wt 163.2 lb

## 2016-04-28 DIAGNOSIS — I480 Paroxysmal atrial fibrillation: Secondary | ICD-10-CM | POA: Diagnosis not present

## 2016-04-28 DIAGNOSIS — D151 Benign neoplasm of heart: Secondary | ICD-10-CM | POA: Diagnosis not present

## 2016-04-28 DIAGNOSIS — Z720 Tobacco use: Secondary | ICD-10-CM

## 2016-04-28 DIAGNOSIS — G44209 Tension-type headache, unspecified, not intractable: Secondary | ICD-10-CM | POA: Diagnosis not present

## 2016-04-28 DIAGNOSIS — E785 Hyperlipidemia, unspecified: Secondary | ICD-10-CM

## 2016-04-28 DIAGNOSIS — I6340 Cerebral infarction due to embolism of unspecified cerebral artery: Secondary | ICD-10-CM

## 2016-04-28 DIAGNOSIS — Z7901 Long term (current) use of anticoagulants: Secondary | ICD-10-CM

## 2016-04-28 MED ORDER — BUTALBITAL-APAP-CAFFEINE 50-325-40 MG PO TABS: 1.0000 | ORAL_TABLET | Freq: Two times a day (BID) | ORAL | Status: DC | PRN

## 2016-04-28 NOTE — Progress Notes (Signed)
STROKE NEUROLOGY FOLLOW UP NOTE  NAME: Julia Schwartz DOB: 1951-06-27  REASON FOR VISIT: stroke follow up HISTORY FROM: pt and chart  Today we had the pleasure of seeing Julia Schwartz in follow-up at our Neurology Clinic. Pt was accompanied by granddaughter.   History Summary Julia Schwartz is a 65 y.o. female with history of HTN, smoker, anxiety, depression and fibromyalgia presenting with right-sided weakness. MRI showed bilateral punctate infarcts L>R, embolic pattern. CTA head and neck, TTE, LE venous doppler all negative. TEE done showed left atrial septum mobile lesion, suspicious for myxoma. LDL 83, and A1C 5.9. Hypercoagulable work up showed low protein S and slight positive lupus anticoagulant. Her stroke most likely due to cardiac mass. She was started on heparin drip and discharged on lovenox.   She had appointment with Dr. Prescott Gum on 10/31/15 and had myxoma resection surgery on 11/04/15. 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 Postoperative course was complicated by blood loss anemia. She required transfusion with 2 units PRBCs. She was placed on Coumadin for stroke prophylaxis with a goal INR 2-2.5. She also developed several episodes of PAF with RVR. CHADS2-VASc=4 (female, HTN, CVA). She was placed on amiodarone and developed tachybradycardia syndrome. She further required permanent pacemaker implantation 11/14/15 with Dr. Caryl Comes.  01/08/16 follow up - the patient has been doing well. Neuro deficit resolved except that patient still complains not able to write well with right hand. Had a follow-up with cardiology and CT surgery, continued on aspirin and Coumadin with INR goal 2.0-2.5. Last INR 2.0. Patient continues smoking, about one pack per day. Blood pressure today 103/70.  Interval History During the interval time, pt has been doing well. No stroke like symptoms. Has been following with cardiology and CTS.  Continued on ASA and coumadin. However, pt stopped ASA on her own.  INR ranging from 1.4 to 4.6. Last INR two weeks ago was 1.8. Has quit smoking and on chantix. She stated HA around bilateral temporal region with neck muscle tension, has a lot of stress at home now - his son still on ventilation due to pneumonia and cirrhosis and she has to back and forth from [place] for family issue. BP today 145/70   REVIEW OF SYSTEMS: Full 14 system review of systems performed and notable only for those listed below and in HPI above, all others are negative:  Constitutional:  Appetite change, fatigue Cardiovascular:  Leg swelling Ear/Nose/Throat:   Skin:  Eyes:   Respiratory:   Gastroitestinal:   Genitourinary:  Hematology/Lymphatic:   Endocrine:  Musculoskeletal:  Back pain, walking difficulty Allergy/Immunology:   Neurological:  HA Psychiatric: agitation and depression Sleep: restless leg, insomnia  The following represents the patient's updated allergies and side effects list: Allergies  Allergen Reactions  . Ultram [Tramadol] Hives    The neurologically relevant items on the patient's problem list were reviewed on today's visit.  Neurologic Examination  A problem focused neurological exam (12 or more points of the single system neurologic examination, vital signs counts as 1 point, cranial nerves count for 8 points) was performed.  There were no vitals taken for this visit.  General - Well nourished, well developed, in no apparent distress.  Ophthalmologic - Fundi not visualized due to noncooperation.  Cardiovascular - Regular rate and rhythm.  Mental Status -  Level of arousal and orientation to time, place, and person were intact. Language including expression, naming, repetition, comprehension was assessed and found intact.  Fund of Knowledge was assessed and was intact.  Cranial Nerves II - XII - II - Visual field intact OU. III, IV, VI - Extraocular movements intact. V -  Facial sensation intact bilaterally. VII - right nasolabial fold mild flattening. VIII - Hearing & vestibular intact bilaterally. X - Palate elevates symmetrically. XI - Chin turning & shoulder shrug intact bilaterally. XII - Tongue protrusion intact.  Motor Strength - The patient's strength was normal in all extremities and pronator drift was absent.  Bulk was normal and fasciculations were absent.   Motor Tone - Muscle tone was assessed at the neck and appendages and was normal.  Reflexes - The patient's reflexes were 1+ in all extremities and she had no pathological reflexes.  Sensory - Light touch, temperature/pinprick were assessed and were normal.    Coordination - The patient had normal movements in the hands and feet with no ataxia or dysmetria.  Tremor was absent.  Gait and Station - The patient's transfers, posture, gait, station, and turns were observed as normal.  Data reviewed: I personally reviewed the images and agree with the radiology interpretations.  Ct Head Wo Contrast 10/17/2015 Suspicious asymmetric hyperdensity at the left ICA terminus, but otherwise normal for age noncontrast CT appearance of the brain.   CT HEAD 10/17/2015 : No abnormal intracranial enhancement.   CTA NECK 10/17/2015 : No acute vascular process or hemodynamically significant stenosis.   CTA HEAD 10/17/2015 : No acute large vessel occlusion or high-grade stenosis. Complete circle of Willis.   Mr Brain Wo Contrast 10/17/2015 Innumerable punctate infarctions affecting both cerebral hemispheres, much more extensive on the left than the right, which could be due to a shower of micro emboli or watershed infarctions. No large confluent infarction. No mass effect or hemorrhage.   Bilateral lower extremity venous duplex  Visualized veins of bilateral lower extremities are negative for deep vein thrombosis. There is no evidence of Baker's cyst bilaterally.  2D Echocardiogram  - Left  ventricle: The cavity size was normal. There was mild focalbasal hypertrophy of the septum. Systolic function was normal.The estimated ejection fraction was 55%. Wall motion was normal;there were no regional wall motion abnormalities. - Atrial septum: No defect or patent foramen ovale was identified. - Impressions: No cardiac source of emboli was indentified.  TEE - There is a large mobile echodensity attached to the supero-anterior portion of the interatrial septum from the left atrial side. The echodensity measures 27 x 29 mm and has one largest lobe with multiple smaller thin mobile parts. The mass appears myxomatous. It is not obstructing flow.  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 A cardiac MRI is recommended for further evaluation.   MRI heart - poor quality  CTA chest 10/29/15 -  Changes consistent with the known history of left atrial myxoma Mild prominence of the ascending aorta as described. No other focal abnormality is seen.  Component     Latest Ref Rng 10/18/2015 10/31/2015  Cholesterol     0 - 200 mg/dL 141   Triglycerides     <150 mg/dL 78   HDL Cholesterol     >40 mg/dL 42   Total CHOL/HDL Ratio      3.4   VLDL     0 - 40 mg/dL 16   LDL (calc)     0 - 99 mg/dL 83   PTT Lupus Anticoagulant     0.0 - 40.6 sec 47.1 (H)   DRVVT  0.0 - 44.0 sec 38.3   Lupus Anticoag Interp      Comment:   Beta-2 Glycoprotein I Ab, IgG     0 - 20 GPI IgG units <9   Beta-2-Glycoprotein I IgM     0 - 32 GPI IgM units <9   Beta-2-Glycoprotein I IgA     0 - 25 GPI IgA units <9   Anticardiolipin Ab,IgG,Qn     0 - 14 GPL U/mL <9   Anticardiolipin Ab,IgM,Qn     0 - 12 MPL U/mL <9   Anticardiolipin Ab,IgA,Qn     0 - 11 APL U/mL <9   Hemoglobin A1C     4.8 - 5.6 % 5.9 (H) 5.9 (H)  Mean Plasma Glucose      123 123  Recommendations-F5LEID:      Comment   Comment      Comment   Recommendations-PTGENE:      Comment   Additional  Information      Comment   Antithrombin Activity     75 - 120 % 83   Protein C-Functional     73 - 180 % 101   Protein C, Total     60 - 150 % 75   Protein S-Functional     63 - 140 % 42 (L)   Protein S, Total     60 - 150 % 83   Homocysteine     0.0 - 15.0 umol/L 5.3   ANA Ab, IFA      Negative   CRP     <1.0 mg/dL <0.5   Sed Rate     0 - 22 mm/hr 16   ds DNA Ab     0 - 9 IU/mL 1   PTT-LA Mix     0.0 - 40.6 sec 43.4 (H)   Hexagonal Phase Phospholipid     0 - 11 sec 17 (H)     Assessment: As you may recall, she is a 65 y.o. African American female with PMH of HTN, smoker was admitted on 10/18/15 for right-sided weakness. MRI showed bilateral punctate infarcts L>R, embolic pattern. CTA head and neck, TTE, LE venous doppler all negative. TEE done showed left atrial septum mobile lesion, suspicious for myxoma. CTA chest showed left atrial myxoma. LDL 83, and A1C 5.9. Hypercoagulable work up showed low protein S, positive HPP and slight positive lupus anticoagulant. Her stroke most likely due to left atrial myxoma. She was started on heparin drip and discharged on lovenox. Had myxoma resection surgery on 11/04/15. Postoperative course was complicated by blood loss anemia requiring transfusion. She was placed on Coumadin for stroke prophylaxis with a goal INR 2-2.5. She also developed several episodes of PAF with RVR with CHADS2-VASc=4 (female, HTN, CVA). She was placed on amiodarone but developed tachybradycardia syndrome which required permanent pacemaker implantation on 11/14/15. Pt continued on Coumadin with INR goal 2.0-3.0 (as per coumadin clinic records). Last INR 1.8. Pt has stopped ASA on her own. Patient has quit smoking and on chantix now. Complains of tension HA due to family stress.   Plan:  - continue coumadin and Lipitor for stroke provention. - bleeding precautions - off ASA now. From stroke standpoint, OK to be off ASA.  - will give fioricet for HA, no more than 2 a day  and no more than 5 a week. Also use tylenol for HA treatment. - check BP at home and avoid low BP - continue to follow up with cardiology and  CT surgery - Follow up with your primary care physician for stroke risk factor modification. Recommend maintain blood pressure goal <130/80, diabetes with hemoglobin A1c goal below 6.5% and lipids with LDL cholesterol goal below 70 mg/dL.  - continue abstain from smoking - follow up in 6 months.   I spent more than 25 minutes of face to face time with the patient. Greater than 50% of time was spent in counseling and coordination of care. We discussed about continuing the Coumadin use, INR monitoring, tension HA management, follow-up with cardiology and CT surgery, and smoking cessation.   No orders of the defined types were placed in this encounter.    No orders of the defined types were placed in this encounter.    There are no Patient Instructions on file for this visit.  Rosalin Hawking, MD PhD [facility] [address], [place], [place] [address] [phone]

## 2016-04-28 NOTE — Patient Instructions (Addendum)
-   continue coumadin and Lipitor for stroke provention. - bleeding precautions - off ASA now. From stroke standpoint, OK to be off ASA.  - will give fioricet for HA, no more than 2 a day and no more than 5 a week. Also use tylenol for HA treatment. - check BP at home and avoid low BP - continue to follow up with cardiology and CT surgery - Follow up with your primary care physician for stroke risk factor modification. Recommend maintain blood pressure goal <130/80, diabetes with hemoglobin A1c goal below 6.5% and lipids with LDL cholesterol goal below 70 mg/dL.  - continue abstain from smoking - follow up in 6 months.

## 2016-04-30 ENCOUNTER — Ambulatory Visit (INDEPENDENT_AMBULATORY_CARE_PROVIDER_SITE_OTHER): Payer: Medicare Other | Admitting: Pharmacist

## 2016-04-30 LAB — POCT INR: INR: 2

## 2016-05-06 ENCOUNTER — Ambulatory Visit (INDEPENDENT_AMBULATORY_CARE_PROVIDER_SITE_OTHER): Payer: Medicare Other | Admitting: Cardiology

## 2016-05-06 DIAGNOSIS — R0989 Other specified symptoms and signs involving the circulatory and respiratory systems: Secondary | ICD-10-CM

## 2016-05-12 ENCOUNTER — Encounter: Payer: Self-pay | Admitting: Cardiology

## 2016-05-15 ENCOUNTER — Telehealth: Payer: Self-pay | Admitting: *Deleted

## 2016-05-15 NOTE — Telephone Encounter (Signed)
Tiffany with [facility] called to inform [facility] that the pt rescheduled her [facility] visit until 05/18/16.  She stated that pt called her today & told her not to come to the house because she did not want the [facility] to see her today. Also, per [facility] RN the pt was using explicit words after the [facility] RN was educating her about the importance of having INR completed today.  Tiffany with [facility] will call the pt on Monday to ensure she will allow her to obtain an INR & inform us if she does not or call with results at that time.

## 2016-05-18 ENCOUNTER — Ambulatory Visit (INDEPENDENT_AMBULATORY_CARE_PROVIDER_SITE_OTHER): Payer: Medicare Other | Admitting: Internal Medicine

## 2016-05-18 LAB — POCT INR: INR: 1.1

## 2016-05-25 ENCOUNTER — Ambulatory Visit (INDEPENDENT_AMBULATORY_CARE_PROVIDER_SITE_OTHER): Payer: Medicare Other | Admitting: Cardiology

## 2016-05-25 LAB — POCT INR: INR: 2.3

## 2016-05-26 ENCOUNTER — Ambulatory Visit (INDEPENDENT_AMBULATORY_CARE_PROVIDER_SITE_OTHER): Payer: Medicare Other | Admitting: *Deleted

## 2016-05-26 ENCOUNTER — Telehealth: Payer: Self-pay | Admitting: Cardiology

## 2016-05-26 DIAGNOSIS — I495 Sick sinus syndrome: Secondary | ICD-10-CM

## 2016-05-26 NOTE — Telephone Encounter (Signed)
Spoke with pt and reminded pt of remote transmission that is due today. Pt verbalized understanding.   

## 2016-05-26 NOTE — Progress Notes (Signed)
Remote pacemaker transmission.   

## 2016-05-27 ENCOUNTER — Encounter: Payer: Self-pay | Admitting: Cardiology

## 2016-06-01 ENCOUNTER — Ambulatory Visit (INDEPENDENT_AMBULATORY_CARE_PROVIDER_SITE_OTHER): Payer: Medicare Other | Admitting: Cardiology

## 2016-06-01 LAB — CUP PACEART REMOTE DEVICE CHECK
Brady Statistic AP VS Percent: 16.37 %
Brady Statistic AS VP Percent: 0.02 %
Brady Statistic AS VS Percent: 83.59 %
Date Time Interrogation Session: 20170808143040
Implantable Lead Implant Date: 20170126
Implantable Lead Location: 753859
Implantable Lead Model: 5076
Lead Channel Impedance Value: 361 Ohm
Lead Channel Impedance Value: 399 Ohm
Lead Channel Pacing Threshold Amplitude: 0.625 V
Lead Channel Pacing Threshold Pulse Width: 0.4 ms
Lead Channel Sensing Intrinsic Amplitude: 0.625 mV
Lead Channel Sensing Intrinsic Amplitude: 11 mV
Lead Channel Setting Pacing Pulse Width: 0.4 ms
MDC IDC LEAD IMPLANT DT: 20170126
MDC IDC LEAD LOCATION: 753860
MDC IDC MSMT BATTERY REMAINING LONGEVITY: 118 mo
MDC IDC MSMT BATTERY VOLTAGE: 3.03 V
MDC IDC MSMT LEADCHNL RA PACING THRESHOLD AMPLITUDE: 0.375 V
MDC IDC MSMT LEADCHNL RA PACING THRESHOLD PULSEWIDTH: 0.4 ms
MDC IDC MSMT LEADCHNL RA SENSING INTR AMPL: 0.625 mV
MDC IDC MSMT LEADCHNL RV IMPEDANCE VALUE: 418 Ohm
MDC IDC MSMT LEADCHNL RV IMPEDANCE VALUE: 494 Ohm
MDC IDC MSMT LEADCHNL RV SENSING INTR AMPL: 11 mV
MDC IDC SET LEADCHNL RA PACING AMPLITUDE: 2 V
MDC IDC SET LEADCHNL RV PACING AMPLITUDE: 2 V
MDC IDC SET LEADCHNL RV SENSING SENSITIVITY: 2.8 mV
MDC IDC STAT BRADY AP VP PERCENT: 0.02 %
MDC IDC STAT BRADY RA PERCENT PACED: 16.39 %
MDC IDC STAT BRADY RV PERCENT PACED: 0.04 %

## 2016-06-01 LAB — POCT INR: INR: 2.4

## 2016-06-15 ENCOUNTER — Ambulatory Visit (INDEPENDENT_AMBULATORY_CARE_PROVIDER_SITE_OTHER): Payer: Medicare Other | Admitting: Cardiology

## 2016-06-15 LAB — POCT INR: INR: 4.3

## 2016-06-24 ENCOUNTER — Ambulatory Visit (INDEPENDENT_AMBULATORY_CARE_PROVIDER_SITE_OTHER): Payer: Medicare Other | Admitting: Internal Medicine

## 2016-06-24 LAB — POCT INR: INR: 2.8

## 2016-07-06 ENCOUNTER — Other Ambulatory Visit: Payer: Self-pay | Admitting: *Deleted

## 2016-07-06 MED ORDER — WARFARIN SODIUM 5 MG PO TABS: ORAL_TABLET | ORAL | 2 refills | Status: DC

## 2016-07-08 ENCOUNTER — Ambulatory Visit (INDEPENDENT_AMBULATORY_CARE_PROVIDER_SITE_OTHER): Payer: Medicare Other | Admitting: Cardiology

## 2016-07-08 LAB — POCT INR: INR: 3.3

## 2016-07-22 ENCOUNTER — Ambulatory Visit (INDEPENDENT_AMBULATORY_CARE_PROVIDER_SITE_OTHER): Payer: Medicare Other | Admitting: Cardiovascular Disease

## 2016-07-22 LAB — POCT INR: INR: 3.7

## 2016-08-05 ENCOUNTER — Ambulatory Visit (INDEPENDENT_AMBULATORY_CARE_PROVIDER_SITE_OTHER): Payer: Medicare Other | Admitting: Cardiology

## 2016-08-05 LAB — POCT INR: INR: 1.5

## 2016-08-12 ENCOUNTER — Ambulatory Visit (INDEPENDENT_AMBULATORY_CARE_PROVIDER_SITE_OTHER): Payer: Medicare Other | Admitting: Cardiology

## 2016-08-12 LAB — POCT INR: INR: 1.8

## 2016-08-18 ENCOUNTER — Other Ambulatory Visit: Payer: Self-pay | Admitting: Cardiothoracic Surgery

## 2016-08-19 ENCOUNTER — Ambulatory Visit (INDEPENDENT_AMBULATORY_CARE_PROVIDER_SITE_OTHER): Payer: Medicare Other | Admitting: Cardiology

## 2016-08-19 ENCOUNTER — Encounter: Payer: Self-pay | Admitting: *Deleted

## 2016-08-19 LAB — POCT INR: INR: 2.4

## 2016-08-25 ENCOUNTER — Ambulatory Visit (INDEPENDENT_AMBULATORY_CARE_PROVIDER_SITE_OTHER): Payer: Medicare Other | Admitting: *Deleted

## 2016-08-25 ENCOUNTER — Telehealth: Payer: Self-pay | Admitting: Cardiology

## 2016-08-25 DIAGNOSIS — I495 Sick sinus syndrome: Secondary | ICD-10-CM | POA: Diagnosis not present

## 2016-08-25 NOTE — Progress Notes (Signed)
Remote pacemaker transmission.   

## 2016-08-25 NOTE — Telephone Encounter (Signed)
Confirmed remote transmission w/ pt husband.   

## 2016-08-26 ENCOUNTER — Encounter: Payer: Self-pay | Admitting: Cardiology

## 2016-08-31 ENCOUNTER — Telehealth: Payer: Self-pay | Admitting: Cardiology

## 2016-08-31 NOTE — Telephone Encounter (Signed)
error 

## 2016-09-02 ENCOUNTER — Ambulatory Visit (INDEPENDENT_AMBULATORY_CARE_PROVIDER_SITE_OTHER): Payer: Medicare Other | Admitting: Cardiovascular Disease

## 2016-09-02 LAB — POCT INR: INR: 1.4

## 2016-09-03 ENCOUNTER — Ambulatory Visit (INDEPENDENT_AMBULATORY_CARE_PROVIDER_SITE_OTHER): Payer: Medicare Other | Admitting: Cardiology

## 2016-09-03 ENCOUNTER — Encounter (INDEPENDENT_AMBULATORY_CARE_PROVIDER_SITE_OTHER): Payer: Self-pay

## 2016-09-03 ENCOUNTER — Encounter: Payer: Self-pay | Admitting: Cardiology

## 2016-09-03 VITALS — BP 112/70 | HR 70 | Ht 61.0 in | Wt 164.8 lb

## 2016-09-03 DIAGNOSIS — Z950 Presence of cardiac pacemaker: Secondary | ICD-10-CM

## 2016-09-03 DIAGNOSIS — E785 Hyperlipidemia, unspecified: Secondary | ICD-10-CM

## 2016-09-03 DIAGNOSIS — D151 Benign neoplasm of heart: Secondary | ICD-10-CM

## 2016-09-03 DIAGNOSIS — E782 Mixed hyperlipidemia: Secondary | ICD-10-CM

## 2016-09-03 DIAGNOSIS — I495 Sick sinus syndrome: Secondary | ICD-10-CM

## 2016-09-03 DIAGNOSIS — I480 Paroxysmal atrial fibrillation: Secondary | ICD-10-CM

## 2016-09-03 DIAGNOSIS — Z79899 Other long term (current) drug therapy: Secondary | ICD-10-CM | POA: Diagnosis not present

## 2016-09-03 DIAGNOSIS — I10 Essential (primary) hypertension: Secondary | ICD-10-CM | POA: Diagnosis not present

## 2016-09-03 LAB — CBC WITH DIFFERENTIAL/PLATELET
Basophils Absolute: 77 cells/uL (ref 0–200)
Basophils Relative: 1 %
Eosinophils Absolute: 770 cells/uL — ABNORMAL HIGH (ref 15–500)
Eosinophils Relative: 10 %
HCT: 36.3 % (ref 35.0–45.0)
Hemoglobin: 11.4 g/dL — ABNORMAL LOW (ref 11.7–15.5)
Lymphocytes Relative: 33 %
Lymphs Abs: 2541 cells/uL (ref 850–3900)
MCH: 28.6 pg (ref 27.0–33.0)
MCHC: 31.4 g/dL — ABNORMAL LOW (ref 32.0–36.0)
MCV: 91 fL (ref 80.0–100.0)
MPV: 10.9 fL (ref 7.5–12.5)
Monocytes Absolute: 924 cells/uL (ref 200–950)
Monocytes Relative: 12 %
Neutro Abs: 3388 cells/uL (ref 1500–7800)
Neutrophils Relative %: 44 %
Platelets: 219 10*3/uL (ref 140–400)
RBC: 3.99 MIL/uL (ref 3.80–5.10)
RDW: 14.3 % (ref 11.0–15.0)
WBC: 7.7 10*3/uL (ref 3.8–10.8)

## 2016-09-03 LAB — COMPREHENSIVE METABOLIC PANEL
ALT: 38 U/L — ABNORMAL HIGH (ref 6–29)
AST: 63 U/L — ABNORMAL HIGH (ref 10–35)
Albumin: 4.2 g/dL (ref 3.6–5.1)
Alkaline Phosphatase: 76 U/L (ref 33–130)
BUN: 30 mg/dL — ABNORMAL HIGH (ref 7–25)
CO2: 23 mmol/L (ref 20–31)
Calcium: 9 mg/dL (ref 8.6–10.4)
Chloride: 106 mmol/L (ref 98–110)
Creat: 1.54 mg/dL — ABNORMAL HIGH (ref 0.50–0.99)
Glucose, Bld: 89 mg/dL (ref 65–99)
Potassium: 4.6 mmol/L (ref 3.5–5.3)
Sodium: 137 mmol/L (ref 135–146)
Total Bilirubin: 0.5 mg/dL (ref 0.2–1.2)
Total Protein: 7.9 g/dL (ref 6.1–8.1)

## 2016-09-03 LAB — TSH: TSH: 2.09 mIU/L

## 2016-09-03 MED ORDER — METOPROLOL TARTRATE 25 MG PO TABS: 12.5000 mg | ORAL_TABLET | Freq: Two times a day (BID) | ORAL | 3 refills | Status: AC

## 2016-09-03 NOTE — Patient Instructions (Signed)
Medication Instructions:   DECREASE YOUR METOPROLOL TARTRATE TO 12.5 MG BY MOUTH TWICE DAILY   Labwork:  TODAY-CMET, TSH, AND CBC W DIFF    Follow-Up:  Your physician wants you to follow-up in: [place] will receive a reminder letter in the mail two months in advance. If you don't receive a letter, please call our office to schedule the follow-up appointment.        If you need a refill on your cardiac medications before your next appointment, please call your pharmacy.

## 2016-09-03 NOTE — Progress Notes (Addendum)
Patient ID: Julia Schwartz, female   DOB: 02-25-1951, 65 y.o.   MRN: [id number]      Cardiology Office Note:    Date:  09/03/2016   ID:  Julia Schwartz, DOB 1951-03-13, MRN [id number]  PCP:  [name], MD  Cardiologist:  Dr. Ena Dawley   Electrophysiologist:  Dr. Virl Axe   Chief complain: Posthospitalization follow-up  History of Present Illness:     Julia Schwartz is a 65 y.o. 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 TEE demonstrated large atrial myxoma. LHC demonstrated normal coronary arteries. Patient was seen by Dr. Prescott Gum who recommended surgical resection. 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 Postoperative course was complicated by blood loss anemia. She required transfusion with 2 units PRBCs. She was placed on Coumadin for stroke prophylaxis with a goal INR 2-2.5. She also developed several episodes of PAF with RVR. CHADS2-VASc=4 (female, HTN, CVA). She was placed on amiodarone and developed tachybradycardia syndrome. She further required permanent pacemaker implantation 11/14/15 with Dr. Caryl Comes.  01/10/2016  - She returns for follow-up. She has been complaining to her medications and denies any bleeding with Coumadin. She also denies any chest pain or dyspnea. No fever or chills, her sternotomy wound is healing very well. No tenderness or discharge around the pacemaker site. She denies any palpitations or syncope. She complains of lower extremity edema that gets worse toward the end of the day but not every day. She continues taking Lasix 20 mg daily. She has home health physical and occupational  Therapy.  09/02/2016 - 6 months., The patient says that she is very depressed, she lost her youngest son in August due to liver disease. She is struggling with balance problems and she is not  leaving her house. She denies any chest pain or shortness of breath, she has no orthopnea paroxysmal nocturnal dyspnea no lower extremity edema. She has been compliant with her medicines.   Past Medical History:  Diagnosis Date  . Anxiety   . Arthritis   . 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 Echo 2/17:  Mild focal basal septal hypertrophy, EF 50-55%, no RWMA, Gr 1 DD, trivial MR, normal RVF, mild TR, pericardial patch in interatrial septum patent - no residual flow  . Chronic back pain   . Depression   . Fibromyalgia   . GERD (gastroesophageal reflux disease)   . Hepatitis    hep  c    dx 2007  . Hypertension   . Stroke [facility])    TIA came to er on 1/3  some deficits in right hand  . Wears dentures    top-partial bottom  . Wears glasses     Past Surgical History:  Procedure Laterality Date  . ABDOMINAL HYSTERECTOMY    . BUNIONECTOMY     both feet  . CARDIAC CATHETERIZATION N/A 10/23/2015   Procedure: Left Heart Cath and Coronary Angiography;  Surgeon: Belva Crome, MD;  Location: [place] [facility];  Service: Cardiovascular;  Laterality: N/A;  . EP IMPLANTABLE DEVICE N/A 11/14/2015   Procedure: Pacemaker Implant;  Surgeon: Deboraha Sprang, MD;  Location: [place] [facility];  Service: Cardiovascular;  Laterality: N/A;  . EXCISION OF ATRIAL MYXOMA N/A 11/04/2015   Procedure: RESECTION OF LEFT ATRIAL MYXOMA, BI-ATRIAL APPROACH WITH PERICARDIAL PATCH;  Surgeon: Ivin Poot, MD;  Location: [place];  Service: Open Heart Surgery;  Laterality: N/A;  . FRACTURE SURGERY     fx lt ankle  . SUBMANDIBULAR GLAND EXCISION     left  . TEE WITHOUT CARDIOVERSION N/A 10/18/2015   Procedure: TRANSESOPHAGEAL ECHOCARDIOGRAM (TEE);  Surgeon: Fadia Spark, MD;  Location: [place];  Service: Cardiovascular;  Laterality: N/A;  . TEE WITHOUT CARDIOVERSION N/A 11/04/2015   Procedure: TRANSESOPHAGEAL ECHOCARDIOGRAM (TEE);  Surgeon: Ivin Poot, MD;  Location: [place];  Service: Open Heart  Surgery;  Laterality: N/A;  . TONSILLECTOMY      Current Medications: Outpatient Medications Prior to Visit  Medication Sig Dispense Refill  . aspirin EC 81 MG EC tablet Take 1 tablet (81 mg total) by mouth daily.    Marland Kitchen atorvastatin (LIPITOR) 40 MG tablet Take 0.5 tablets (20 mg total) by mouth daily at 6 PM.    . busPIRone (BUSPAR) 15 MG tablet Take 15 mg by mouth 2 (two) times daily.     . butalbital-acetaminophen-caffeine (FIORICET, ESGIC) 50-325-40 MG tablet Take 1 tablet by mouth every 12 (twelve) hours as needed for headache. 15 tablet 1  . furosemide (LASIX) 20 MG tablet Take 20 mg by mouth daily.    . metoprolol tartrate (LOPRESSOR) 25 MG tablet TAKE 1 TABLET(25 MG) BY MOUTH TWICE DAILY 60 tablet 10  . mirtazapine (REMERON) 15 MG tablet Take 15 mg by mouth at bedtime.    Marland Kitchen omeprazole (PRILOSEC) 40 MG capsule Take 1 capsule (40 mg total) by mouth daily. 30 capsule 0  . pregabalin (LYRICA) 75 MG capsule Take 75-150 mg by mouth 2 (two) times daily. 150mg  in the morning and 75mg  in the evening    . rOPINIRole (REQUIP) 0.25 MG tablet Take 0.25 mg by mouth 2 (two) times daily.    . SUMAtriptan (IMITREX) 50 MG tablet Take 50 mg by mouth as needed for migraine.     . SYMBICORT 160-4.5 MCG/ACT inhaler Inhale 2 puffs into the lungs as needed.     Marland Kitchen tiZANidine (ZANAFLEX) 4 MG capsule Take 4 mg by mouth 3 (three) times daily as needed for muscle spasms.     Marland Kitchen warfarin (COUMADIN) 5 MG tablet TAKE 1 tablet daily or AS DIRECTED BY COUMADIN [facility] 30 tablet 2  . zolpidem (AMBIEN) 10 MG tablet Take 10 mg by mouth at bedtime.     . clonazePAM (KLONOPIN) 1 MG tablet Take 1 mg by mouth 2 (two) times daily.    . sertraline (ZOLOFT) 50 MG tablet Take 50 mg by mouth daily.    . varenicline (CHANTIX) 1 MG tablet 1 mg 2 (two) times daily.     No facility-administered medications prior to visit.      Allergies:   Ultram [tramadol]   Social History   Social History  . Marital status: Single    Spouse  name: N/A  . Number of children: 5  . Years of education: 12   Occupational History  .      retired   Social History Main Topics  . Smoking status: Former Smoker    Packs/day: 0.25    Years: 10.00    Types: Cigarettes    Quit date: 01/28/2016  . Smokeless tobacco: None  . Alcohol use No  . Drug use: No  . Sexual activity: Not Asked   Other Topics Concern  . None   Social History Narrative   Lives with family    Family History:  The patient's family history includes COPD in her mother; Diabetes in her maternal grandmother.   ROS:   Please see the history of present illness.    Review of Systems  Cardiovascular: Positive for chest pain.  Musculoskeletal: Positive for back pain.  Gastrointestinal: Positive for constipation.  Neurological: Positive for headaches.  Psychiatric/Behavioral: Positive for depression.  All other systems reviewed and are negative.   Physical Exam:    VS:  BP 112/70 (BP Location: Left Arm, Patient Position: Sitting, Cuff Size: Large)   Pulse 70   Ht 5\' 1"  (1.549 m)   Wt 164 lb 12.8 oz (74.8 kg)   SpO2 98%   BMI 31.14 kg/m    GEN: Well nourished, well developed, in no acute distress HEENT: normal Neck: no JVD, no masses Cardiac: Normal S1/S2, RRR; no murmurs, no edema Chest: Median sternotomy well-healed without erythema or discharge; pacer site has no sign of erythema. Respiratory:  clear to auscultation bilaterally; no wheezing, rhonchi or rales GI: soft, nontender MS: no deformity or atrophy Skin: warm and dry, no rash Neuro:  no focal deficits  Psych: Alert and oriented x 3, normal affect  Wt Readings from Last 3 Encounters:  09/03/16 164 lb 12.8 oz (74.8 kg)  04/28/16 163 lb 3.2 oz (74 kg)  02/25/16 162 lb (73.5 kg)      Studies/Labs Reviewed:     EKG:  EKG is  ordered today.  The ekg ordered today demonstrates NSR, HR 77, normal axis, nonspecific ST-T wave changes, QTC 353 ms, no change from prior tracing  Recent  Labs: 11/05/2015: Magnesium 1.9 11/15/2015: Hemoglobin 12.0; Platelets 329 11/21/2015: BUN 12; Creat 0.97; Potassium 4.0; Sodium 137 01/10/2016: ALT 26; TSH 2.08   Recent Lipid Panel    Component Value Date/Time   CHOL 140 01/10/2016 0939   TRIG 137 01/10/2016 0939   HDL 45 (L) 01/10/2016 0939   CHOLHDL 3.1 01/10/2016 0939   VLDL 27 01/10/2016 0939   LDLCALC 68 01/10/2016 0939    Additional studies/ records that were reviewed today include:   Carotid US 10/24/15 No evidence of stenosis in bilateral ICA  LHC 10/23/15 Normal coronary arteries  TEE 10/18/15 EF 55-60%, normal wall motion, mild MR, LA with large mobile density attached to the superioro-anterior portion of the interatrial septum measuring 27 x 29 mm, no LAA clot  Echo 10/18/15 Mild focal basal septal hypertrophy, EF 55%,? Mass in LA   ASSESSMENT/PLAN:     1. L Atrial Myxoma s/p resection - Overall, she is doing well. Follow-up echo showed no residual tumor and preserved LVEF.  2. PAF - Maintaining NSR. We have asked to interrogate her pacemaker today and there were no episodes of atrial fibrillation. AF occurred post myxoma resection. Considering she had a stroke and her CHADS2-VASc=4 continue chronic anticoagulation with Coumadin. We will set the nuchal of INR 2-3 now. We'll check CBC today.  3. S/p Pacer - Secondary to tachy-brady syndrome. Her pacemakers function well.  4. HTN - Controlled.  I will decrease metoprolol to 12.5 mg by mouth twice a day and she is instructed to take Lasix only as needed as she is experiencing orthostatic hypotension.  5. HL - Now on Lipitor.  All lipids at goal.   6. S/p Embolic CVA - FU with Neuro as planned.   7. Tobacco abuse - She has quit smoking.     8. Lower extremity edema - resolved with Lasix 20 mg daily.  Follow-up in 6 months.  Signed,  Ena Dawley, MD  09/03/2016 8:48 AM    [facility] [address], [place], [place]  [address] Phone:  [phone]; Fax: [phone]

## 2016-09-04 ENCOUNTER — Telehealth: Payer: Self-pay | Admitting: *Deleted

## 2016-09-04 DIAGNOSIS — R945 Abnormal results of liver function studies: Principal | ICD-10-CM

## 2016-09-04 DIAGNOSIS — R7989 Other specified abnormal findings of blood chemistry: Secondary | ICD-10-CM | POA: Insufficient documentation

## 2016-09-04 NOTE — Telephone Encounter (Signed)
Will d/c the pts meds (atorvastatin and lasix) out of her med list, for other caring MD's to be able to visualize and know this is being held.

## 2016-09-04 NOTE — Addendum Note (Signed)
Addended by: Nuala Alpha on: 09/04/2016 08:43 AM   Modules accepted: Orders

## 2016-09-04 NOTE — Telephone Encounter (Signed)
-----   Message from Courtny Spark, MD sent at 09/04/2016 12:00 AM EST ----- Elevated Crea and LFTs. Please advise her to stop using lipitor and lasix and increase her fluid intake. Please repeat CMP in 1 month.

## 2016-09-04 NOTE — Telephone Encounter (Signed)
-----   Message from Aleya Spark, MD sent at 09/04/2016 12:00 AM EST ----- Elevated Crea and LFTs. Please advise her to stop using lipitor and lasix and increase her fluid intake. Please repeat CMP in 1 month.

## 2016-09-04 NOTE — Telephone Encounter (Signed)
Me      09/04/16 8:40 AM  Note    Will d/c the pts meds (atorvastatin and lasix) out of her med list, for other caring MD's to be able to visualize and know this is being held.     Me      09/04/16 8:32 AM  Note    Notified the pt that per Dr Meda Coffee, her labs showed elevated creatinine and LFTs, and she recommends that we stop/hold her lasix and atorvastatin x 1 month and have her come into the office then, to have a repeat cmet done.  Also advised the pt that per Dr Meda Coffee, she should increase her PO fluid intake.  Informed the pt that we will advise on her 2 meds being held, after she comes in, in one month to have her repeat cmet drawn, and results are received.  Scheduled the pt a lab appt for Monday 10/05/16, to recheck a cmet. Pt verbalized understanding and agrees with this plan.

## 2016-09-04 NOTE — Telephone Encounter (Signed)
Notified the pt that per Dr Meda Coffee, her labs showed elevated creatinine and LFTs, and she recommends that we stop/hold her lasix and atorvastatin x 1 month and have her come into the office then, to have a repeat cmet done.  Also advised the pt that per Dr Meda Coffee, she should increase her PO fluid intake.  Informed the pt that we will advise on her 2 meds being held, after she comes in, in one month to have her repeat cmet drawn, and results are received.  Scheduled the pt a lab appt for Monday 10/05/16, to recheck a cmet. Pt verbalized understanding and agrees with this plan.

## 2016-09-09 ENCOUNTER — Ambulatory Visit (INDEPENDENT_AMBULATORY_CARE_PROVIDER_SITE_OTHER): Payer: Medicare Other | Admitting: Cardiovascular Disease

## 2016-09-09 LAB — POCT INR: INR: 2.1

## 2016-09-22 LAB — CUP PACEART REMOTE DEVICE CHECK
Battery Remaining Longevity: 114 mo
Brady Statistic AP VP Percent: 0.02 %
Brady Statistic AP VS Percent: 20.69 %
Brady Statistic AS VS Percent: 79.27 %
Brady Statistic RV Percent Paced: 0.04 %
Implantable Lead Implant Date: 20170126
Implantable Lead Location: 753859
Implantable Lead Model: 5076
Lead Channel Impedance Value: 342 Ohm
Lead Channel Impedance Value: 399 Ohm
Lead Channel Impedance Value: 399 Ohm
Lead Channel Impedance Value: 475 Ohm
Lead Channel Pacing Threshold Amplitude: 0.625 V
Lead Channel Sensing Intrinsic Amplitude: 0.625 mV
Lead Channel Sensing Intrinsic Amplitude: 8.875 mV
Lead Channel Setting Pacing Amplitude: 2 V
Lead Channel Setting Sensing Sensitivity: 2.8 mV
MDC IDC LEAD IMPLANT DT: 20170126
MDC IDC LEAD LOCATION: 753860
MDC IDC MSMT BATTERY VOLTAGE: 3.03 V
MDC IDC MSMT LEADCHNL RA PACING THRESHOLD AMPLITUDE: 0.375 V
MDC IDC MSMT LEADCHNL RA PACING THRESHOLD PULSEWIDTH: 0.4 ms
MDC IDC MSMT LEADCHNL RA SENSING INTR AMPL: 0.625 mV
MDC IDC MSMT LEADCHNL RV PACING THRESHOLD PULSEWIDTH: 0.4 ms
MDC IDC MSMT LEADCHNL RV SENSING INTR AMPL: 8.875 mV
MDC IDC PG IMPLANT DT: 20170126
MDC IDC SESS DTM: 20171107191401
MDC IDC SET LEADCHNL RA PACING AMPLITUDE: 2 V
MDC IDC SET LEADCHNL RV PACING PULSEWIDTH: 0.4 ms
MDC IDC STAT BRADY AS VP PERCENT: 0.02 %
MDC IDC STAT BRADY RA PERCENT PACED: 20.71 %

## 2016-09-23 ENCOUNTER — Ambulatory Visit (INDEPENDENT_AMBULATORY_CARE_PROVIDER_SITE_OTHER): Payer: Medicare Other | Admitting: Cardiovascular Disease

## 2016-09-23 LAB — POCT INR: INR: 2

## 2016-10-01 ENCOUNTER — Encounter: Payer: Self-pay | Admitting: Neurology

## 2016-10-01 ENCOUNTER — Telehealth: Payer: Self-pay | Admitting: Neurology

## 2016-10-01 ENCOUNTER — Telehealth: Payer: Self-pay

## 2016-10-01 NOTE — Telephone Encounter (Signed)
Rn spoke to Dr.Xu about pt wanting klonopin. Per phone staff Katharine Look pt will call back to r/s  appt once she speaks with her son. PEr note pts PCP will not prescribed chantex, ambien, or klonopin. Rn spoke with Dr. Erlinda Hong and is unable to prescribed any of these medications. Pt was been seen for stroke. See previous note, Pts PCP will not prescribed any of the above medications.

## 2016-10-01 NOTE — Telephone Encounter (Signed)
In this circumstance, I have to say that pt has to work out with Dr. Alyson Ingles for this issue. I think there must be reasons that Dr. Alyson Ingles does not like her to continue the medication, and I would not disagree with that. Please let the pt know that I can not prescribe clonazepam for her and she needs to continue work with Dr. Alyson Ingles in this issue.  Thanks.   Rosalin Hawking, MD PhD Stroke Neurology 10/01/2016 6:01 PM

## 2016-10-01 NOTE — Telephone Encounter (Signed)
Was scheduled on 11/05/16 and was cancelled by [facility], pat cannot wait till Feb for appointment  Please call pt at [phone]

## 2016-10-01 NOTE — Telephone Encounter (Signed)
Pt called to r/s appt and advised her legs and bil hands are shaking. She advised her PCP was giving her clonazePAM (KLONOPIN) 1 MG tablet but he is retired. She is seeing Dr Alyson Ingles PCP but he won't write RX for clonazepam, ambien, chantex.  FYI

## 2016-10-02 NOTE — Telephone Encounter (Signed)
Rn spoke with patient about wanting Dr. Erlinda Hong to prescribed klonopin. Rn stated Dr. Erlinda Hong responded to the telephone note.Rn stated Dr. Erlinda Hong will not prescribed chantex, ambien, or clonzepam. Julia Schwartz stated " my PCP retired and the new PCP will not prescribed it". Julia Schwartz stated ' I have taken one of my daughters clonazepam,and I should not be doing that". Rn advised Julia Schwartz to not take other family members medications. Julia Schwartz verbalized understanding that Dr. Erlinda Hong WILL NOT PRESCRIBED CLONZEPAM.".  Julia Schwartz stated she had an appt in January 2018. Rn stated there is not appt in January. Rn explain Dr. Erlinda Hong schedule is changing. Julia Schwartz schedule appt in February with Dr. Erlinda Hong.

## 2016-10-05 ENCOUNTER — Other Ambulatory Visit: Payer: Medicare Other | Admitting: *Deleted

## 2016-10-05 DIAGNOSIS — R945 Abnormal results of liver function studies: Principal | ICD-10-CM

## 2016-10-05 DIAGNOSIS — R7989 Other specified abnormal findings of blood chemistry: Secondary | ICD-10-CM

## 2016-10-05 LAB — COMPREHENSIVE METABOLIC PANEL
ALT: 21 U/L (ref 6–29)
AST: 36 U/L — ABNORMAL HIGH (ref 10–35)
Albumin: 4 g/dL (ref 3.6–5.1)
Alkaline Phosphatase: 68 U/L (ref 33–130)
BUN: 19 mg/dL (ref 7–25)
CO2: 19 mmol/L — ABNORMAL LOW (ref 20–31)
Calcium: 9 mg/dL (ref 8.6–10.4)
Chloride: 107 mmol/L (ref 98–110)
Creat: 1.25 mg/dL — ABNORMAL HIGH (ref 0.50–0.99)
Glucose, Bld: 81 mg/dL (ref 65–99)
Potassium: 4.7 mmol/L (ref 3.5–5.3)
Sodium: 136 mmol/L (ref 135–146)
Total Bilirubin: 0.5 mg/dL (ref 0.2–1.2)
Total Protein: 7.7 g/dL (ref 6.1–8.1)

## 2016-10-06 ENCOUNTER — Telehealth: Payer: Self-pay | Admitting: Cardiology

## 2016-10-06 DIAGNOSIS — E785 Hyperlipidemia, unspecified: Secondary | ICD-10-CM

## 2016-10-06 MED ORDER — PRAVASTATIN SODIUM 20 MG PO TABS
20.0000 mg | ORAL_TABLET | Freq: Every evening | ORAL | 3 refills | Status: DC
Start: 2016-10-06 — End: 2017-06-04

## 2016-10-06 NOTE — Telephone Encounter (Signed)
Please start pravastatin 20 mg po daily.   Repeat LFTs and lipids in 2 months.  [place]   Notified the pt that per Dr Meda Coffee she reviewed her labs and recommends that she start pravastatin 20 mg po daily, have repeat LFTs and lipids in 2 months.  Confirmed the pharmacy of choice with the pt.  Lab appt scheduled for 12/08/15 to check LFTs and lipids.  Pt is aware to come fasting to this lab appt.  Pt verbalized understanding and agrees with this plan.

## 2016-10-06 NOTE — Telephone Encounter (Signed)
-----   Message from Dianna Spark, MD sent at 10/06/2016  9:58 AM EST ----- Karlene Einstein, Please start pravastatin 20 mg po daily.  Repeat LFTs and lipids in 2 months. KN

## 2016-10-06 NOTE — Telephone Encounter (Signed)
Follow up ° ° °Pt verbalized that she is returning call for rn °

## 2016-10-14 ENCOUNTER — Ambulatory Visit (INDEPENDENT_AMBULATORY_CARE_PROVIDER_SITE_OTHER): Payer: Medicare Other | Admitting: Pharmacist

## 2016-10-14 DIAGNOSIS — I6310 Cerebral infarction due to embolism of unspecified precerebral artery: Secondary | ICD-10-CM

## 2016-10-14 LAB — PROTIME-INR: INR: 1.8 — AB (ref <=1.1)

## 2016-10-15 NOTE — Telephone Encounter (Signed)
error 

## 2016-10-21 ENCOUNTER — Other Ambulatory Visit: Payer: Self-pay | Admitting: Internal Medicine

## 2016-10-28 ENCOUNTER — Ambulatory Visit (INDEPENDENT_AMBULATORY_CARE_PROVIDER_SITE_OTHER): Payer: Medicare Other | Admitting: Cardiovascular Disease

## 2016-10-28 ENCOUNTER — Other Ambulatory Visit: Payer: Self-pay | Admitting: Cardiothoracic Surgery

## 2016-10-28 DIAGNOSIS — I6310 Cerebral infarction due to embolism of unspecified precerebral artery: Secondary | ICD-10-CM

## 2016-10-28 DIAGNOSIS — R079 Chest pain, unspecified: Secondary | ICD-10-CM

## 2016-10-28 LAB — POCT INR: INR: 2.6

## 2016-10-29 ENCOUNTER — Other Ambulatory Visit (HOSPITAL_COMMUNITY): Payer: Medicare Other

## 2016-10-29 ENCOUNTER — Ambulatory Visit: Payer: Medicare Other | Admitting: Neurology

## 2016-11-04 ENCOUNTER — Ambulatory Visit: Payer: Medicare Other | Admitting: Cardiothoracic Surgery

## 2016-11-17 ENCOUNTER — Other Ambulatory Visit (HOSPITAL_COMMUNITY): Payer: Medicare Other

## 2016-11-18 ENCOUNTER — Ambulatory Visit (INDEPENDENT_AMBULATORY_CARE_PROVIDER_SITE_OTHER): Payer: Medicare Other | Admitting: Cardiovascular Disease

## 2016-11-18 DIAGNOSIS — I6310 Cerebral infarction due to embolism of unspecified precerebral artery: Secondary | ICD-10-CM

## 2016-11-18 LAB — POCT INR: INR: 2.7

## 2016-11-25 ENCOUNTER — Ambulatory Visit: Payer: Medicare Other | Admitting: Cardiothoracic Surgery

## 2016-11-27 ENCOUNTER — Other Ambulatory Visit (HOSPITAL_COMMUNITY): Payer: Medicare Other

## 2016-12-02 ENCOUNTER — Ambulatory Visit: Payer: Medicare Other | Admitting: Family Medicine

## 2016-12-02 ENCOUNTER — Ambulatory Visit: Payer: Medicare Other | Admitting: Cardiothoracic Surgery

## 2016-12-07 ENCOUNTER — Other Ambulatory Visit: Payer: Medicare Other

## 2016-12-08 ENCOUNTER — Other Ambulatory Visit: Payer: Medicare Other | Admitting: *Deleted

## 2016-12-08 DIAGNOSIS — E785 Hyperlipidemia, unspecified: Secondary | ICD-10-CM

## 2016-12-08 LAB — LIPID PANEL
Chol/HDL Ratio: 3 (ref 0.0–4.4)
Cholesterol, Total: 146 mg/dL (ref 100–199)
HDL: 48 mg/dL (ref >39)
LDL Calculated: 85 (ref 0–99)
Triglycerides: 65 mg/dL (ref 0–149)
VLDL Cholesterol Cal: 13 (ref 5–40)

## 2016-12-08 LAB — HEPATIC FUNCTION PANEL
ALT: 22 IU/L (ref 0–32)
AST: 32 IU/L (ref 0–40)
Albumin: 4.3 g/dL (ref 3.6–4.8)
Alkaline Phosphatase: 61 IU/L (ref 39–117)
Bilirubin Total: 0.3 mg/dL (ref 0.0–1.2)
Bilirubin, Direct: 0.07 mg/dL (ref 0.00–0.40)
Total Protein: 8.1 g/dL (ref 6.0–8.5)

## 2016-12-08 NOTE — Addendum Note (Signed)
Addended by: Eulis Foster on: 12/08/2016 10:58 AM   Modules accepted: Orders

## 2016-12-09 ENCOUNTER — Ambulatory Visit: Payer: Medicare Other | Admitting: Cardiothoracic Surgery

## 2016-12-09 ENCOUNTER — Encounter: Payer: Self-pay | Admitting: Neurology

## 2016-12-09 ENCOUNTER — Ambulatory Visit (INDEPENDENT_AMBULATORY_CARE_PROVIDER_SITE_OTHER): Payer: Medicare Other | Admitting: Neurology

## 2016-12-09 VITALS — BP 140/82 | HR 90 | Wt 170.0 lb

## 2016-12-09 DIAGNOSIS — Z720 Tobacco use: Secondary | ICD-10-CM | POA: Diagnosis not present

## 2016-12-09 DIAGNOSIS — I6340 Cerebral infarction due to embolism of unspecified cerebral artery: Secondary | ICD-10-CM

## 2016-12-09 DIAGNOSIS — D151 Benign neoplasm of heart: Secondary | ICD-10-CM

## 2016-12-09 DIAGNOSIS — Z7901 Long term (current) use of anticoagulants: Secondary | ICD-10-CM

## 2016-12-09 DIAGNOSIS — I480 Paroxysmal atrial fibrillation: Secondary | ICD-10-CM | POA: Diagnosis not present

## 2016-12-09 DIAGNOSIS — E785 Hyperlipidemia, unspecified: Secondary | ICD-10-CM | POA: Diagnosis not present

## 2016-12-09 NOTE — Progress Notes (Signed)
STROKE NEUROLOGY FOLLOW UP NOTE  NAME: Julia Schwartz DOB: 10/09/1951  REASON FOR VISIT: stroke follow up HISTORY FROM: pt and chart  Today we had the pleasure of seeing Julia Schwartz in follow-up at our Neurology [facility]. Pt was accompanied by granddaughter.   History Summary Julia Schwartz is a 66 y.o. female with history of HTN, smoker, anxiety, depression and fibromyalgia presenting with right-sided weakness. MRI showed bilateral punctate infarcts L>R, embolic pattern. CTA head and neck, TTE, LE venous doppler all negative. TEE done showed left atrial septum mobile lesion, suspicious for myxoma. LDL 83, and A1C 5.9. Hypercoagulable work up showed low protein S and slight positive lupus anticoagulant. Her stroke most likely due to cardiac mass. She was started on heparin drip and discharged on lovenox.   She had appointment with Dr. Prescott Gum on 10/31/15 and had myxoma resection surgery on 11/04/15. 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 Postoperative course was complicated by blood loss anemia. She required transfusion with 2 units PRBCs. She was placed on Coumadin for stroke prophylaxis with a goal INR 2-2.5. She also developed several episodes of PAF with RVR. CHADS2-VASc=4 (female, HTN, CVA). She was placed on amiodarone and developed tachybradycardia syndrome. She further required permanent pacemaker implantation 11/14/15 with Dr. Caryl Comes.  01/08/16 follow up - the patient has been doing well. Neuro deficit resolved except that patient still complains not able to write well with right hand. Had a follow-up with cardiology and CT surgery, continued on aspirin and Coumadin with INR goal 2.0-2.5. Last INR 2.0. Patient continues smoking, about one pack per day. Blood pressure today 103/70.  Follow up 04/28/16 - pt has been doing well. No stroke like symptoms. Has been following with cardiology and CTS. Continued on ASA and coumadin.  However, pt stopped ASA on her own.  INR ranging from 1.4 to 4.6. Last INR two weeks ago was 1.8. Has quit smoking and on chantix. She stated HA around bilateral temporal region with neck muscle tension, has a lot of stress at home now - his son still on ventilation due to pneumonia and cirrhosis and she has to back and forth from [place] for family issue. BP today 145/70   Interval History During the interval time, pt has been doing well. No stroke like symptoms. Has been following with cardiology and CTS. Continued on ASA and coumadin. As per Dr. Meda Coffee, she did not have further afib on pacer and AF only occurred post myxoma resection. She was continued on coumadin due to the stroke and CHADS2-VASc=4. She has repeat TTE this week and will see Dr. Prescott Gum next week for decision on coumadin. However, pt stated that she has difficulty to folllow up in coumadin clinic for INR check due to transportation issue. Last INR 11/19/15 was 2.7. Relapsed on smoking yet and not willing to quit yet. BP today 140/82.   REVIEW OF SYSTEMS: Full 14 system review of systems performed and notable only for those listed below and in HPI above, all others are negative:  Constitutional:   Cardiovascular:  Leg swelling Ear/Nose/Throat:   Skin:  Eyes:  Eye itching, blurry vision Respiratory:   Gastroitestinal:   Genitourinary:  Hematology/Lymphatic:   Endocrine:  Musculoskeletal:  Back pain, walking difficulty, joint pain Allergy/Immunology:   Neurological:  HA, dizziness, weakness, passing out, memory loss Psychiatric: confusion and depression Sleep: restless leg, snoring  The following represents the patient's updated allergies and side effects list: Allergies  Allergen Reactions  . Ultram [Tramadol] Hives    The neurologically relevant items on the patient's problem list were reviewed on today's visit.  Neurologic Examination  A problem focused neurological exam (12 or more points of the single system  neurologic examination, vital signs counts as 1 point, cranial nerves count for 8 points) was performed.  Blood pressure 140/82, pulse 90, weight 170 lb (77.1 kg).  General - Well nourished, well developed, in no apparent distress.  Ophthalmologic - Fundi not visualized due to noncooperation.  Cardiovascular - Regular rate and rhythm.  Mental Status -  Level of arousal and orientation to time, place, and person were intact. Language including expression, naming, repetition, comprehension was assessed and found intact. Fund of Knowledge was assessed and was intact.  Cranial Nerves II - XII - II - Visual field intact OU. III, IV, VI - Extraocular movements intact. V - Facial sensation intact bilaterally. VII - right nasolabial fold mild flattening. VIII - Hearing & vestibular intact bilaterally. X - Palate elevates symmetrically. XI - Chin turning & shoulder shrug intact bilaterally. XII - Tongue protrusion intact.  Motor Strength - The patient's strength was normal in all extremities and pronator drift was absent.  Bulk was normal and fasciculations were absent.   Motor Tone - Muscle tone was assessed at the neck and appendages and was normal.  Reflexes - The patient's reflexes were 1+ in all extremities and she had no pathological reflexes.  Sensory - Light touch, temperature/pinprick were assessed and were normal.    Coordination - The patient had normal movements in the hands and feet with no ataxia or dysmetria.  Tremor was absent.  Gait and Station - The patient's transfers, posture, gait, station, and turns were observed as normal.  Data reviewed: I personally reviewed the images and agree with the radiology interpretations.  Ct Head Wo Contrast 10/17/2015 Suspicious asymmetric hyperdensity at the left ICA terminus, but otherwise normal for age noncontrast CT appearance of the brain.   CT HEAD 10/17/2015 : No abnormal intracranial enhancement.   CTA  NECK 10/17/2015 : No acute vascular process or hemodynamically significant stenosis.   CTA HEAD 10/17/2015 : No acute large vessel occlusion or high-grade stenosis. Complete circle of Willis.   Mr Brain Wo Contrast 10/17/2015 Innumerable punctate infarctions affecting both cerebral hemispheres, much more extensive on the left than the right, which could be due to a shower of micro emboli or watershed infarctions. No large confluent infarction. No mass effect or hemorrhage.   Bilateral lower extremity venous duplex  Visualized veins of bilateral lower extremities are negative for deep vein thrombosis. There is no evidence of Baker's cyst bilaterally.  2D Echocardiogram  - Left ventricle: The cavity size was normal. There was mild focalbasal hypertrophy of the septum. Systolic function was normal.The estimated ejection fraction was 55%. Wall motion was normal;there were no regional wall motion abnormalities. - Atrial septum: No defect or patent foramen ovale was identified. - Impressions: No cardiac source of emboli was indentified.  TEE - There is a large mobile echodensity attached to the supero-anterior portion of the interatrial septum from the left atrial side. The echodensity measures 27 x 29 mm and has one largest lobe with multiple smaller thin mobile parts. The mass appears myxomatous. It is not obstructing flow.  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 A cardiac MRI is recommended for further evaluation.   MRI heart - poor quality  CTA chest 10/29/15 -  Changes consistent with the known history of left atrial myxoma Mild prominence of the ascending aorta as described. No other focal abnormality is seen.  Component     Latest Ref Rng 10/18/2015 10/31/2015  Cholesterol     0 - 200 mg/dL 141   Triglycerides     <150 mg/dL 78   HDL Cholesterol     >40 mg/dL 42   Total CHOL/HDL Ratio      3.4   VLDL     0 - 40 mg/dL 16    LDL (calc)     0 - 99 mg/dL 83   PTT Lupus Anticoagulant     0.0 - 40.6 sec 47.1 (H)   DRVVT     0.0 - 44.0 sec 38.3   Lupus Anticoag Interp      Comment:   Beta-2 Glycoprotein I Ab, IgG     0 - 20 GPI IgG units <9   Beta-2-Glycoprotein I IgM     0 - 32 GPI IgM units <9   Beta-2-Glycoprotein I IgA     0 - 25 GPI IgA units <9   Anticardiolipin Ab,IgG,Qn     0 - 14 GPL U/mL <9   Anticardiolipin Ab,IgM,Qn     0 - 12 MPL U/mL <9   Anticardiolipin Ab,IgA,Qn     0 - 11 APL U/mL <9   Hemoglobin A1C     4.8 - 5.6 % 5.9 (H) 5.9 (H)  Mean Plasma Glucose      123 123  Recommendations-F5LEID:      Comment   Comment      Comment   Recommendations-PTGENE:      Comment   Additional Information      Comment   Antithrombin Activity     75 - 120 % 83   Protein C-Functional     73 - 180 % 101   Protein C, Total     60 - 150 % 75   Protein S-Functional     63 - 140 % 42 (L)   Protein S, Total     60 - 150 % 83   Homocysteine     0.0 - 15.0 umol/L 5.3   ANA Ab, IFA      Negative   CRP     <1.0 mg/dL <0.5   Sed Rate     0 - 22 mm/hr 16   ds DNA Ab     0 - 9 IU/mL 1   PTT-LA Mix     0.0 - 40.6 sec 43.4 (H)   Hexagonal Phase Phospholipid     0 - 11 sec 17 (H)    Component     Latest Ref Rng & Units 09/03/2016 12/08/2016  Cholesterol, Total     100 - 199 mg/dL  146  Triglycerides     0 - 149 mg/dL  65  HDL Cholesterol     >39 mg/dL  48  VLDL Cholesterol Cal     5 - 40  13  LDL (calc)     0 - 99  85  Total CHOL/HDL Ratio     0.0 - 4.4  3.0  TSH     mIU/L 2.09     Assessment: As you may recall, she is a 66 y.o. African American female with PMH of HTN, smoker was admitted on 10/18/15 for right-sided weakness. MRI showed bilateral punctate infarcts L>R, embolic pattern. CTA head and neck, TTE, LE venous doppler all negative. TEE done  showed left atrial septum mobile lesion, suspicious for myxoma. CTA chest showed left atrial myxoma. LDL 83, and A1C 5.9. Hypercoagulable  work up showed low protein S, positive HPP and slight positive lupus anticoagulant. Her stroke most likely due to left atrial myxoma. She was started on heparin drip and discharged on lovenox. Had myxoma resection surgery on 11/04/15. Postoperative course was complicated by blood loss anemia requiring transfusion. She was placed on Coumadin for stroke prophylaxis with a goal INR 2-2.5. She also developed several episodes of PAF with RVR with CHADS2-VASc=4 (female, HTN, CVA). She was placed on amiodarone but developed tachybradycardia syndrome which required permanent pacemaker implantation on 11/14/15. Pt continued on Coumadin and ASA. Last INR 2.7. Patient has relapsed on smoking and not willing to quit. Followed up with cardiology and pacer showed no more afib episode since myxoma resection. She is going to have TTE repeat and see Dr. Prescott Gum next week. Pt expressed that she will have difficulty for INR check due to transportation issue. Since pt stroke most likely due to myxoma which has been resected, and also no more afib on pace maker, with now difficulty with INR check and questionable compliance, I would recommend discontinue coumadin if repeat TTE more reassuring. If she still needs anticoagulation, may consider DOACs for compliance and safety.  Plan:  - continue coumadin and Lipitor for stroke provention for now.  - follow up with Dr. Prescott Gum and with Dr. Meda Coffee as scheduled  - I will discuss with Dr. Meda Coffee and Dr. Prescott Gum to see if coumadin still needed. If so, we may think about change to NOACs. If no further need, d/c coumadin but keep ASA.  - continue pacemaker monitoring. If afib recurs, pt still needs anticoagulation - check BP at home and record - Follow up with your primary care physician for stroke risk factor modification. Recommend maintain blood pressure goal <130/80, diabetes with hemoglobin A1c goal below 6.5% and lipids with LDL cholesterol goal below 70 mg/dL.  - quit  smoking - follow up in 6 months with me.   I spent more than 25 minutes of face to face time with the patient. Greater than 50% of time was spent in counseling and coordination of care. We discussed about further discussion about Coumadin use, follow-up with cardiology and CT surgery, and smoking cessation.   No orders of the defined types were placed in this encounter.   No orders of the defined types were placed in this encounter.   Patient Instructions  - continue coumadin and Lipitor for stroke provention for now.  - follow up with Dr. Prescott Gum and with Dr. Meda Coffee as scheduled - I am a little concerned about your coumadin use. Your lack of transportation put you at risk for INR checking and compliance with medication.  - I will discuss with Dr. Meda Coffee and Dr. Prescott Gum to see if you still need coumadin. If you need, we may think about change to newer generation meds so that you do not need to check INR. If you do not need, will change to ASA.  - bleeding precautions - check BP at home and record - Follow up with your primary care physician for stroke risk factor modification. Recommend maintain blood pressure goal <130/80, diabetes with hemoglobin A1c goal below 6.5% and lipids with LDL cholesterol goal below 70 mg/dL.  - quit smoking - follow up in 6 months with me.    Julia Hawking, MD PhD [facility] [id number]  [address] [place], [place] [address] [phone]

## 2016-12-09 NOTE — Patient Instructions (Addendum)
-   continue coumadin and Lipitor for stroke provention for now.  - follow up with Dr. Prescott Gum and with Dr. Meda Coffee as scheduled - I am a little concerned about your coumadin use. Your lack of transportation put you at risk for INR checking and compliance with medication.  - I will discuss with Dr. Meda Coffee and Dr. Prescott Gum to see if you still need coumadin. If you need, we may think about change to newer generation meds so that you do not need to check INR. If you do not need, will change to ASA.  - bleeding precautions - check BP at home and record - Follow up with your primary care physician for stroke risk factor modification. Recommend maintain blood pressure goal <130/80, diabetes with hemoglobin A1c goal below 6.5% and lipids with LDL cholesterol goal below 70 mg/dL.  - quit smoking - follow up in 6 months with me.

## 2016-12-15 ENCOUNTER — Ambulatory Visit (HOSPITAL_COMMUNITY): Payer: Medicare Other | Attending: Cardiovascular Disease

## 2016-12-15 ENCOUNTER — Other Ambulatory Visit: Payer: Self-pay

## 2016-12-15 DIAGNOSIS — R079 Chest pain, unspecified: Secondary | ICD-10-CM | POA: Insufficient documentation

## 2016-12-15 DIAGNOSIS — I071 Rheumatic tricuspid insufficiency: Secondary | ICD-10-CM | POA: Insufficient documentation

## 2016-12-16 ENCOUNTER — Ambulatory Visit (INDEPENDENT_AMBULATORY_CARE_PROVIDER_SITE_OTHER): Payer: Medicare Other | Admitting: *Deleted

## 2016-12-16 DIAGNOSIS — I480 Paroxysmal atrial fibrillation: Secondary | ICD-10-CM

## 2016-12-16 DIAGNOSIS — I6310 Cerebral infarction due to embolism of unspecified precerebral artery: Secondary | ICD-10-CM

## 2016-12-16 DIAGNOSIS — I6340 Cerebral infarction due to embolism of unspecified cerebral artery: Secondary | ICD-10-CM | POA: Diagnosis not present

## 2016-12-16 LAB — PROTIME-INR
INR: 9.5 (ref 0.8–1.2)
Prothrombin Time: 92.1 s — ABNORMAL HIGH (ref 9.1–12.0)

## 2016-12-16 LAB — POCT INR: INR: 8

## 2016-12-17 ENCOUNTER — Ambulatory Visit: Payer: Medicare Other | Admitting: Cardiothoracic Surgery

## 2016-12-18 ENCOUNTER — Encounter: Payer: Self-pay | Admitting: Cardiothoracic Surgery

## 2016-12-18 ENCOUNTER — Ambulatory Visit (INDEPENDENT_AMBULATORY_CARE_PROVIDER_SITE_OTHER): Payer: Medicare Other | Admitting: Cardiothoracic Surgery

## 2016-12-18 VITALS — BP 97/68 | HR 92 | Resp 20 | Ht 61.0 in | Wt 164.0 lb

## 2016-12-18 DIAGNOSIS — D151 Benign neoplasm of heart: Secondary | ICD-10-CM

## 2016-12-18 DIAGNOSIS — Z09 Encounter for follow-up examination after completed treatment for conditions other than malignant neoplasm: Secondary | ICD-10-CM | POA: Diagnosis not present

## 2016-12-18 NOTE — Progress Notes (Signed)
PCP is Ricke Hey, MD Referring Provider is Lucy Spark, MD  Chief Complaint  Patient presents with  . Routine Post Op    6 week f/u, ECHO 12/15/16    HPI: Patient returns for one year follow-up after undergoing resection of a atrial myxoma which was responsible for a preoperative CVA. The deficits from the stroke resolved. The patient has been on Coumadin ever since surgery which has been problematic due to compliance issues and problems with INR assays. Earlier this week she had TEE which showed good biventricular function, no recurrent myxoma, ASD and she has been maintained in sinus rhythm.  I told the patient that she should stop taking her Coumadin but she should remain on her aspirin indefinitely.  Her main complaints concerning her anxiety and tremors.  She has no symptoms of angina or CHF.  Past Medical History:  Diagnosis Date  . Anxiety   . Arthritis   . 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 Echo 2/17:  Mild focal basal septal hypertrophy, EF 50-55%, no RWMA, Gr 1 DD, trivial MR, normal RVF, mild TR, pericardial patch in interatrial septum patent - no residual flow  . Chronic back pain   . Depression   . Fibromyalgia   . GERD (gastroesophageal reflux disease)   . Hepatitis    hep  c    dx 2007  . Hypertension   . Stroke [facility])    TIA came to er on 1/3  some deficits in right hand  . Wears dentures    top-partial bottom  . Wears glasses     Past Surgical History:  Procedure Laterality Date  . ABDOMINAL HYSTERECTOMY    . BUNIONECTOMY     both feet  . CARDIAC CATHETERIZATION N/A 10/23/2015   Procedure: Left Heart Cath and Coronary Angiography;  Surgeon: Belva Crome, MD;  Location: [facility];  Service: Cardiovascular;  Laterality: N/A;  . EP IMPLANTABLE DEVICE N/A 11/14/2015   Procedure: Pacemaker Implant;  Surgeon: Deboraha Sprang, MD;  Location: [facility];  Service: Cardiovascular;  Laterality: N/A;  . EXCISION OF ATRIAL  MYXOMA N/A 11/04/2015   Procedure: RESECTION OF LEFT ATRIAL MYXOMA, BI-ATRIAL APPROACH WITH PERICARDIAL PATCH;  Surgeon: Ivin Poot, MD;  Location: [facility] [place];  Service: Open Heart Surgery;  Laterality: N/A;  . FRACTURE SURGERY     fx lt ankle  . SUBMANDIBULAR GLAND EXCISION     left  . TEE WITHOUT CARDIOVERSION N/A 10/18/2015   Procedure: TRANSESOPHAGEAL ECHOCARDIOGRAM (TEE);  Surgeon: Ashantia Spark, MD;  Location: [place];  Service: Cardiovascular;  Laterality: N/A;  . TEE WITHOUT CARDIOVERSION N/A 11/04/2015   Procedure: TRANSESOPHAGEAL ECHOCARDIOGRAM (TEE);  Surgeon: Ivin Poot, MD;  Location: [place];  Service: Open Heart Surgery;  Laterality: N/A;  . TONSILLECTOMY      Family History  Problem Relation Age of Onset  . COPD Mother   . Diabetes Maternal Grandmother     Social History Social History  Substance Use Topics  . Smoking status: Current Every Day Smoker    Packs/day: 0.25    Years: 10.00    Types: Cigarettes  . Smokeless tobacco: Former Systems developer     Comment: smokes 1 to 2 trying to quit  . Alcohol use No    Current Outpatient Prescriptions  Medication Sig Dispense Refill  . aspirin EC 81 MG EC tablet Take 1 tablet (81 mg total) by mouth daily.    Marland Kitchen  busPIRone (BUSPAR) 15 MG tablet Take 15 mg by mouth 2 (two) times daily.     . butalbital-acetaminophen-caffeine (FIORICET, ESGIC) 50-325-40 MG tablet Take 1 tablet by mouth every 12 (twelve) hours as needed for headache. 15 tablet 1  . metoprolol tartrate (LOPRESSOR) 25 MG tablet Take 0.5 tablets (12.5 mg total) by mouth 2 (two) times daily. 90 tablet 3  . omeprazole (PRILOSEC) 40 MG capsule Take 1 capsule (40 mg total) by mouth daily. 30 capsule 0  . pravastatin (PRAVACHOL) 20 MG tablet Take 1 tablet (20 mg total) by mouth every evening. 90 tablet 3  . pregabalin (LYRICA) 75 MG capsule Take 75-150 mg by mouth 2 (two) times daily. 150mg  in the morning and 75mg  in the evening    . rOPINIRole (REQUIP) 0.25 MG  tablet Take 0.25 mg by mouth 2 (two) times daily.    . sertraline (ZOLOFT) 50 MG tablet Take 50 mg by mouth daily.    . SUMAtriptan (IMITREX) 50 MG tablet Take 50 mg by mouth as needed for migraine.     . SYMBICORT 160-4.5 MCG/ACT inhaler Inhale 2 puffs into the lungs as needed.     Marland Kitchen tiZANidine (ZANAFLEX) 4 MG capsule Take 4 mg by mouth 3 (three) times daily as needed for muscle spasms.     . varenicline (CHANTIX) 1 MG tablet 1 mg 2 (two) times daily.    Marland Kitchen warfarin (COUMADIN) 5 MG tablet TAKE 1 TABLET BY MOUTH DAILY OR AS DIRECTED BY COUMADIN CLINIC 30 tablet 2  . zolpidem (AMBIEN) 10 MG tablet Take 10 mg by mouth at bedtime.      No current facility-administered medications for this visit.     Allergies  Allergen Reactions  . Ultram [Tramadol] Hives    Review of Systems  She is a nonsmoker No orthopnea or PND No substernal chest pain No ankle edema No presyncope No difficulty swallowing  BP 97/68   Pulse 92   Resp 20   Ht 5\' 1"  (1.549 m)   Wt 164 lb (74.4 kg)   SpO2 97%   BMI 30.99 kg/m  Physical Exam      Exam    General- alert and comfortable   Lungs- clear without rales, wheezes   Cor- regular rate and rhythm, no murmur , gallop   Abdomen- soft, non-tender   Extremities - warm, non-tender, minimal edema   Neuro- oriented, appropriate, no focal weakness   Diagnostic Tests:  TEE personally reviewed and results counseled with patient Impression: Good recovery after resection of atrial myxoma Patient has good biventricular function, sinus rhythm She is been on Coumadin for year and can safely stop Coumadin now with respect from her myxoma surgery.  Plan: Remain on 1 aspirin a day indefinitely  return as needed   [name], MD [facility] [phone]

## 2017-01-14 ENCOUNTER — Other Ambulatory Visit: Payer: Self-pay | Admitting: Cardiology

## 2017-01-14 ENCOUNTER — Ambulatory Visit: Payer: Self-pay | Admitting: Pharmacist

## 2017-01-14 DIAGNOSIS — I6310 Cerebral infarction due to embolism of unspecified precerebral artery: Secondary | ICD-10-CM

## 2017-01-14 NOTE — Telephone Encounter (Signed)
LM to call and schedule appt as overdue for INR check 

## 2017-02-22 ENCOUNTER — Ambulatory Visit (INDEPENDENT_AMBULATORY_CARE_PROVIDER_SITE_OTHER): Payer: Medicare Other | Admitting: *Deleted

## 2017-02-22 DIAGNOSIS — I495 Sick sinus syndrome: Secondary | ICD-10-CM

## 2017-02-23 LAB — CUP PACEART REMOTE DEVICE CHECK
Battery Remaining Longevity: 107 mo
Battery Voltage: 3.02 V
Brady Statistic AP VS Percent: 0.01 %
Brady Statistic AS VS Percent: 99.98 %
Implantable Lead Implant Date: 20170126
Implantable Lead Location: 753859
Implantable Lead Model: 5076
Implantable Pulse Generator Implant Date: 20170126
Lead Channel Impedance Value: 494 Ohm
Lead Channel Pacing Threshold Amplitude: 0.375 V
Lead Channel Pacing Threshold Amplitude: 0.75 V
Lead Channel Sensing Intrinsic Amplitude: 0.5 mV
Lead Channel Sensing Intrinsic Amplitude: 0.5 mV
Lead Channel Sensing Intrinsic Amplitude: 9.625 mV
Lead Channel Setting Pacing Amplitude: 2 V
Lead Channel Setting Pacing Pulse Width: 0.4 ms
Lead Channel Setting Sensing Sensitivity: 2.8 mV
MDC IDC LEAD IMPLANT DT: 20170126
MDC IDC LEAD LOCATION: 753860
MDC IDC MSMT LEADCHNL RA IMPEDANCE VALUE: 342 Ohm
MDC IDC MSMT LEADCHNL RA IMPEDANCE VALUE: 361 Ohm
MDC IDC MSMT LEADCHNL RA PACING THRESHOLD PULSEWIDTH: 0.4 ms
MDC IDC MSMT LEADCHNL RV IMPEDANCE VALUE: 437 Ohm
MDC IDC MSMT LEADCHNL RV PACING THRESHOLD PULSEWIDTH: 0.4 ms
MDC IDC MSMT LEADCHNL RV SENSING INTR AMPL: 9.625 mV
MDC IDC SESS DTM: 20180506200354
MDC IDC SET LEADCHNL RA PACING AMPLITUDE: 2 V
MDC IDC STAT BRADY AP VP PERCENT: 0 %
MDC IDC STAT BRADY AS VP PERCENT: 0.02 %
MDC IDC STAT BRADY RA PERCENT PACED: 0.01 %
MDC IDC STAT BRADY RV PERCENT PACED: 0.02 %

## 2017-02-23 NOTE — Progress Notes (Signed)
Remote pacemaker transmission.   

## 2017-02-26 ENCOUNTER — Encounter: Payer: Self-pay | Admitting: Cardiology

## 2017-05-24 ENCOUNTER — Encounter: Payer: Medicare Other | Admitting: *Deleted

## 2017-05-26 ENCOUNTER — Encounter: Payer: Self-pay | Admitting: Cardiology

## 2017-06-03 ENCOUNTER — Inpatient Hospital Stay (HOSPITAL_COMMUNITY): Payer: Medicare Other

## 2017-06-03 ENCOUNTER — Inpatient Hospital Stay (HOSPITAL_COMMUNITY)
Admission: EM | Admit: 2017-06-03 | Discharge: 2017-06-04 | DRG: 917 | Discharge status: Against Medical Advice | Payer: Medicare Other | Attending: Internal Medicine | Admitting: Internal Medicine

## 2017-06-03 ENCOUNTER — Emergency Department (HOSPITAL_COMMUNITY): Payer: Medicare Other

## 2017-06-03 DIAGNOSIS — Z7901 Long term (current) use of anticoagulants: Secondary | ICD-10-CM

## 2017-06-03 DIAGNOSIS — T450X2A Poisoning by antiallergic and antiemetic drugs, intentional self-harm, initial encounter: Secondary | ICD-10-CM

## 2017-06-03 DIAGNOSIS — G2581 Restless legs syndrome: Secondary | ICD-10-CM | POA: Diagnosis present

## 2017-06-03 DIAGNOSIS — I6340 Cerebral infarction due to embolism of unspecified cerebral artery: Secondary | ICD-10-CM | POA: Diagnosis not present

## 2017-06-03 DIAGNOSIS — R4701 Aphasia: Secondary | ICD-10-CM | POA: Diagnosis present

## 2017-06-03 DIAGNOSIS — I495 Sick sinus syndrome: Secondary | ICD-10-CM | POA: Diagnosis present

## 2017-06-03 DIAGNOSIS — Z7982 Long term (current) use of aspirin: Secondary | ICD-10-CM

## 2017-06-03 DIAGNOSIS — I480 Paroxysmal atrial fibrillation: Secondary | ICD-10-CM | POA: Diagnosis present

## 2017-06-03 DIAGNOSIS — Z885 Allergy status to narcotic agent status: Secondary | ICD-10-CM

## 2017-06-03 DIAGNOSIS — R4182 Altered mental status, unspecified: Secondary | ICD-10-CM

## 2017-06-03 DIAGNOSIS — F329 Major depressive disorder, single episode, unspecified: Secondary | ICD-10-CM | POA: Diagnosis present

## 2017-06-03 DIAGNOSIS — I10 Essential (primary) hypertension: Secondary | ICD-10-CM | POA: Diagnosis not present

## 2017-06-03 DIAGNOSIS — Z90710 Acquired absence of both cervix and uterus: Secondary | ICD-10-CM

## 2017-06-03 DIAGNOSIS — Z23 Encounter for immunization: Secondary | ICD-10-CM | POA: Diagnosis present

## 2017-06-03 DIAGNOSIS — Z8673 Personal history of transient ischemic attack (TIA), and cerebral infarction without residual deficits: Secondary | ICD-10-CM

## 2017-06-03 DIAGNOSIS — F419 Anxiety disorder, unspecified: Secondary | ICD-10-CM | POA: Diagnosis present

## 2017-06-03 DIAGNOSIS — G8929 Other chronic pain: Secondary | ICD-10-CM | POA: Diagnosis present

## 2017-06-03 DIAGNOSIS — G92 Toxic encephalopathy: Secondary | ICD-10-CM | POA: Diagnosis present

## 2017-06-03 DIAGNOSIS — Z950 Presence of cardiac pacemaker: Secondary | ICD-10-CM

## 2017-06-03 DIAGNOSIS — D151 Benign neoplasm of heart: Secondary | ICD-10-CM | POA: Diagnosis present

## 2017-06-03 DIAGNOSIS — T450X1A Poisoning by antiallergic and antiemetic drugs, accidental (unintentional), initial encounter: Secondary | ICD-10-CM | POA: Diagnosis present

## 2017-06-03 DIAGNOSIS — K219 Gastro-esophageal reflux disease without esophagitis: Secondary | ICD-10-CM | POA: Diagnosis present

## 2017-06-03 DIAGNOSIS — F17210 Nicotine dependence, cigarettes, uncomplicated: Secondary | ICD-10-CM | POA: Diagnosis present

## 2017-06-03 DIAGNOSIS — T50901A Poisoning by unspecified drugs, medicaments and biological substances, accidental (unintentional), initial encounter: Secondary | ICD-10-CM | POA: Diagnosis not present

## 2017-06-03 DIAGNOSIS — M797 Fibromyalgia: Secondary | ICD-10-CM | POA: Diagnosis present

## 2017-06-03 DIAGNOSIS — E785 Hyperlipidemia, unspecified: Secondary | ICD-10-CM | POA: Diagnosis present

## 2017-06-03 DIAGNOSIS — Z7951 Long term (current) use of inhaled steroids: Secondary | ICD-10-CM

## 2017-06-03 DIAGNOSIS — I639 Cerebral infarction, unspecified: Secondary | ICD-10-CM | POA: Diagnosis present

## 2017-06-03 DIAGNOSIS — T50902A Poisoning by unspecified drugs, medicaments and biological substances, intentional self-harm, initial encounter: Secondary | ICD-10-CM | POA: Diagnosis not present

## 2017-06-03 DIAGNOSIS — R569 Unspecified convulsions: Secondary | ICD-10-CM | POA: Diagnosis not present

## 2017-06-03 DIAGNOSIS — R600 Localized edema: Secondary | ICD-10-CM | POA: Diagnosis present

## 2017-06-03 DIAGNOSIS — Z79899 Other long term (current) drug therapy: Secondary | ICD-10-CM

## 2017-06-03 DIAGNOSIS — M549 Dorsalgia, unspecified: Secondary | ICD-10-CM | POA: Diagnosis present

## 2017-06-03 LAB — I-STAT VENOUS BLOOD GAS, ED
Acid-Base Excess: 1 mmol/L (ref 0.0–2.0)
Bicarbonate: 25.4 mmol/L (ref 20.0–28.0)
O2 SAT: 94 %
PCO2 VEN: 38.8 mmHg — AB (ref 44.0–60.0)
PH VEN: 7.424 (ref 7.250–7.430)
PO2 VEN: 70 mmHg — AB (ref 32.0–45.0)
TCO2: 27 mmol/L (ref 0–100)

## 2017-06-03 LAB — URINALYSIS, ROUTINE W REFLEX MICROSCOPIC
Bilirubin Urine: NEGATIVE
Glucose, UA: NEGATIVE mg/dL
Hgb urine dipstick: NEGATIVE
KETONES UR: NEGATIVE mg/dL
LEUKOCYTES UA: NEGATIVE
NITRITE: NEGATIVE
PH: 7 (ref 5.0–8.0)
Protein, ur: NEGATIVE mg/dL
SPECIFIC GRAVITY, URINE: 1.003 — AB (ref 1.005–1.030)

## 2017-06-03 LAB — COMPREHENSIVE METABOLIC PANEL
ALBUMIN: 3.9 g/dL (ref 3.5–5.0)
ALK PHOS: 73 U/L (ref 38–126)
ALT: 27 U/L (ref 14–54)
ALT: 23 U/L (ref 14–54)
AST: 38 U/L (ref 15–41)
AST: 30 U/L (ref 15–41)
Albumin: 3.7 g/dL (ref 3.5–5.0)
Alkaline Phosphatase: 78 U/L (ref 38–126)
Anion gap: 11 (ref 5–15)
Anion gap: 8 (ref 5–15)
BILIRUBIN TOTAL: 0.7 mg/dL (ref 0.3–1.2)
BUN: 7 mg/dL (ref 6–20)
BUN: 6 mg/dL (ref 6–20)
CALCIUM: 8.8 mg/dL — AB (ref 8.9–10.3)
CHLORIDE: 109 mmol/L (ref 101–111)
CO2: 21 mmol/L — ABNORMAL LOW (ref 22–32)
CO2: 21 mmol/L — ABNORMAL LOW (ref 22–32)
CREATININE: 0.97 mg/dL (ref 0.44–1.00)
Calcium: 9.3 mg/dL (ref 8.9–10.3)
Chloride: 115 mmol/L — ABNORMAL HIGH (ref 101–111)
Creatinine, Ser: 1.08 mg/dL — ABNORMAL HIGH (ref 0.44–1.00)
GFR calc Af Amer: >60 mL/min (ref >60)
GFR calc Af Amer: >60 mL/min (ref >60)
GFR calc non Af Amer: 53 mL/min — ABNORMAL LOW (ref >60)
GFR, EST NON AFRICAN AMERICAN: 60 mL/min — AB (ref >60)
GLUCOSE: 105 mg/dL — AB (ref 65–99)
Glucose, Bld: 104 mg/dL — ABNORMAL HIGH (ref 65–99)
POTASSIUM: 3.6 mmol/L (ref 3.5–5.1)
POTASSIUM: 3.6 mmol/L (ref 3.5–5.1)
Sodium: 141 mmol/L (ref 135–145)
Sodium: 144 mmol/L (ref 135–145)
TOTAL PROTEIN: 7 g/dL (ref 6.5–8.1)
Total Bilirubin: 1 mg/dL (ref 0.3–1.2)
Total Protein: 7.9 g/dL (ref 6.5–8.1)

## 2017-06-03 LAB — CBC WITH DIFFERENTIAL/PLATELET
BASOS PCT: 0 %
Basophils Absolute: 0 10*3/uL (ref 0.0–0.1)
EOS PCT: 0 %
Eosinophils Absolute: 0 10*3/uL (ref 0.0–0.7)
HEMATOCRIT: 34 % — AB (ref 36.0–46.0)
Hemoglobin: 10.5 g/dL — ABNORMAL LOW (ref 12.0–15.0)
Lymphocytes Relative: 8 %
Lymphs Abs: 0.6 10*3/uL — ABNORMAL LOW (ref 0.7–4.0)
MCH: 24.9 pg — ABNORMAL LOW (ref 26.0–34.0)
MCHC: 30.9 g/dL (ref 30.0–36.0)
MCV: 80.8 fL (ref 78.0–100.0)
MONO ABS: 0.5 10*3/uL (ref 0.1–1.0)
Monocytes Relative: 6 %
NEUTROS ABS: 7.3 10*3/uL (ref 1.7–7.7)
Neutrophils Relative %: 86 %
PLATELETS: 299 10*3/uL (ref 150–400)
RBC: 4.21 MIL/uL (ref 3.87–5.11)
RDW: 16.6 % — AB (ref 11.5–15.5)
WBC: 8.4 10*3/uL (ref 4.0–10.5)

## 2017-06-03 LAB — I-STAT TROPONIN, ED
TROPONIN I, POC: 0 ng/mL (ref 0.00–0.08)
TROPONIN I, POC: 0.01 ng/mL (ref 0.00–0.08)

## 2017-06-03 LAB — ACETAMINOPHEN LEVEL: Acetaminophen (Tylenol), Serum: <10 ug/mL — ABNORMAL LOW (ref 10–30)

## 2017-06-03 LAB — PHOSPHORUS: Phosphorus: 3.8 mg/dL (ref 2.5–4.6)

## 2017-06-03 LAB — LACTIC ACID, PLASMA
LACTIC ACID, VENOUS: 1.6 mmol/L (ref 0.5–1.9)
Lactic Acid, Venous: 1.7 mmol/L (ref 0.5–1.9)

## 2017-06-03 LAB — PROTIME-INR
INR: 1.01
PROTHROMBIN TIME: 13.3 s (ref 11.4–15.2)

## 2017-06-03 LAB — MAGNESIUM: Magnesium: 1.6 mg/dL — ABNORMAL LOW (ref 1.7–2.4)

## 2017-06-03 LAB — I-STAT CG4 LACTIC ACID, ED
LACTIC ACID, VENOUS: 1.81 mmol/L (ref 0.5–1.9)
Lactic Acid, Venous: 2.42 mmol/L (ref 0.5–1.9)

## 2017-06-03 LAB — MRSA PCR SCREENING: MRSA by PCR: POSITIVE — AB

## 2017-06-03 LAB — PROCALCITONIN

## 2017-06-03 LAB — CBG MONITORING, ED: GLUCOSE-CAPILLARY: 102 mg/dL — AB (ref 65–99)

## 2017-06-03 LAB — CK: CK TOTAL: 211 U/L (ref 38–234)

## 2017-06-03 LAB — APTT: APTT: 35 s (ref 24–36)

## 2017-06-03 LAB — SALICYLATE LEVEL

## 2017-06-03 LAB — LIPASE, BLOOD: Lipase: 30 U/L (ref 11–51)

## 2017-06-03 MED ORDER — NALOXONE HCL 0.4 MG/ML IJ SOLN: 0.4000 mg | INTRAMUSCULAR | Status: DC | PRN

## 2017-06-03 MED ORDER — TETANUS-DIPHTH-ACELL PERTUSSIS 5-2.5-18.5 LF-MCG/0.5 IM SUSP
0.5000 mL | Freq: Once | INTRAMUSCULAR | Status: AC
  Administered 2017-06-03: 0.5 mL via INTRAMUSCULAR
  Filled 2017-06-03: qty 0.5

## 2017-06-03 MED ORDER — HEPARIN SODIUM (PORCINE) 5000 UNIT/ML IJ SOLN
5000.0000 [IU] | Freq: Three times a day (TID) | INTRAMUSCULAR | Status: DC
  Administered 2017-06-03 – 2017-06-04 (×2): 5000 [IU] via SUBCUTANEOUS
  Filled 2017-06-03 (×2): qty 1

## 2017-06-03 MED ORDER — MAGNESIUM SULFATE 2 GM/50ML IV SOLN
2.0000 g | Freq: Once | INTRAVENOUS | Status: AC
  Administered 2017-06-03: 2 g via INTRAVENOUS
  Filled 2017-06-03: qty 50

## 2017-06-03 MED ORDER — ASPIRIN 300 MG RE SUPP
300.0000 mg | Freq: Every day | RECTAL | Status: DC
  Administered 2017-06-03 – 2017-06-04 (×2): 300 mg via RECTAL
  Filled 2017-06-03 (×2): qty 1

## 2017-06-03 MED ORDER — ACETAMINOPHEN 160 MG/5ML PO SOLN: 650.0000 mg | ORAL | Status: DC | PRN

## 2017-06-03 MED ORDER — POTASSIUM CHLORIDE 10 MEQ/100ML IV SOLN
10.0000 meq | INTRAVENOUS | Status: AC
  Administered 2017-06-03 (×2): 10 meq via INTRAVENOUS
  Filled 2017-06-03 (×2): qty 100

## 2017-06-03 MED ORDER — STROKE: EARLY STAGES OF RECOVERY BOOK
Freq: Once | Status: DC
  Filled 2017-06-03: qty 1

## 2017-06-03 MED ORDER — SODIUM CHLORIDE 0.9 % IV BOLUS (SEPSIS)
1000.0000 mL | Freq: Once | INTRAVENOUS | Status: AC
  Administered 2017-06-03: 1000 mL via INTRAVENOUS

## 2017-06-03 MED ORDER — ACETAMINOPHEN 325 MG PO TABS
650.0000 mg | ORAL_TABLET | ORAL | Status: DC | PRN
  Filled 2017-06-03: qty 2

## 2017-06-03 MED ORDER — CHLORHEXIDINE GLUCONATE CLOTH 2 % EX PADS
6.0000 | MEDICATED_PAD | Freq: Every day | CUTANEOUS | Status: DC
  Administered 2017-06-04: 6 via TOPICAL

## 2017-06-03 MED ORDER — PANTOPRAZOLE SODIUM 40 MG IV SOLR
40.0000 mg | INTRAVENOUS | Status: DC
  Administered 2017-06-03: 40 mg via INTRAVENOUS
  Filled 2017-06-03: qty 40

## 2017-06-03 MED ORDER — ACETAMINOPHEN 650 MG RE SUPP: 650.0000 mg | RECTAL | Status: DC | PRN

## 2017-06-03 MED ORDER — MUPIROCIN 2 % EX OINT
1.0000 "application " | TOPICAL_OINTMENT | Freq: Two times a day (BID) | CUTANEOUS | Status: DC
  Administered 2017-06-04 (×2): 1 via NASAL
  Filled 2017-06-03: qty 22

## 2017-06-03 MED ORDER — SENNOSIDES-DOCUSATE SODIUM 8.6-50 MG PO TABS
1.0000 | ORAL_TABLET | Freq: Every evening | ORAL | Status: DC | PRN
  Filled 2017-06-03: qty 1

## 2017-06-03 MED ORDER — SODIUM CHLORIDE 0.9 % IV SOLN
INTRAVENOUS | Status: AC
  Administered 2017-06-03: 50 mL/h via INTRAVENOUS

## 2017-06-03 MED ORDER — SODIUM CHLORIDE 0.9 % IV BOLUS (SEPSIS)
250.0000 mL | Freq: Once | INTRAVENOUS | Status: AC
  Administered 2017-06-03: 250 mL via INTRAVENOUS

## 2017-06-03 MED ORDER — IOPAMIDOL (ISOVUE-370) INJECTION 76%
INTRAVENOUS | Status: AC
  Administered 2017-06-03: 50 mL
  Filled 2017-06-03: qty 50

## 2017-06-03 MED ORDER — FUROSEMIDE 10 MG/ML IJ SOLN
10.0000 mg | Freq: Every day | INTRAMUSCULAR | Status: DC
  Administered 2017-06-04: 10 mg via INTRAVENOUS
  Filled 2017-06-03: qty 4

## 2017-06-03 NOTE — ED Triage Notes (Signed)
Pt arrives via EMS from home called out for a fall. Found by EMS on the floor, awake, nonverbal, not following commands. LSN 11pm sitting up eating. Family unable to give patients baseline. On arrival, pt awake, nonverbal, follows simple commands. VSS. Lac left ear. 20g L hand. CBG 158.

## 2017-06-03 NOTE — ED Notes (Signed)
Pt cleaned up and PureWick cath placed and suction canister placed.

## 2017-06-03 NOTE — Progress Notes (Signed)
Patient arrived to floor from ED accompanied by sitter. Patient response to voice and pain but appears incoherent at this time. VSS. TELE applied and confirmed.    Ave Filter, RN

## 2017-06-03 NOTE — Consult Note (Signed)
Requesting Physician: Dr. Sherry Ruffing    Chief Complaint: overdose versus cva  History obtained from:   Chart     HPI:                                                                                                                                         Julia Schwartz is an 66 y.o. female who was found this AM by her husband on the floor with multiple pink pills around her and pink dust on her mouth. EMS was called and patient was found severely encephalopathic, moaning and following no commands. Pills appeared to be benadryl.  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 She was off her AC.  CT head shows Cortical and subcortical low-density in the posterior left frontal lobe and anterior left parietal lobe concerning for acute to subacute infarction.    She is able to have MRI but needs [employer] personal in house.    [employer] RHYTHM   Date last known well: Date: 06/02/2017 Time last known well: Time: 19:00 tPA Given: No: out of window   Past Medical History:  Diagnosis Date  . Anxiety   . Arthritis   . 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 Echo 2/17:  Mild focal basal septal hypertrophy, EF 50-55%, no RWMA, Gr 1 DD, trivial MR, normal RVF, mild TR, pericardial patch in interatrial septum patent - no residual flow  . Chronic back pain   . Depression   . Fibromyalgia   . GERD (gastroesophageal reflux disease)   . Hepatitis    hep  c    dx 2007  . Hypertension   . Stroke [facility])    TIA came to er on 1/3  some deficits in right hand  . Wears dentures    top-partial bottom  . Wears glasses     Past Surgical History:  Procedure Laterality Date  . ABDOMINAL HYSTERECTOMY    . BUNIONECTOMY     both feet  . CARDIAC CATHETERIZATION N/A 10/23/2015   Procedure: Left Heart Cath and Coronary Angiography;  Surgeon: Belva Crome, MD;  Location: [place] [facility];  Service: Cardiovascular;  Laterality: N/A;  . EP IMPLANTABLE DEVICE N/A  11/14/2015   Procedure: Pacemaker Implant;  Surgeon: Deboraha Sprang, MD;  Location: [place] [facility];  Service: Cardiovascular;  Laterality: N/A;  . EXCISION OF ATRIAL MYXOMA N/A 11/04/2015   Procedure: RESECTION OF LEFT ATRIAL MYXOMA, BI-ATRIAL APPROACH WITH PERICARDIAL PATCH;  Surgeon: Ivin Poot, MD;  Location: [place];  Service: Open Heart Surgery;  Laterality: N/A;  . FRACTURE SURGERY     fx lt ankle  . SUBMANDIBULAR GLAND EXCISION     left  . TEE WITHOUT CARDIOVERSION N/A 10/18/2015   Procedure: TRANSESOPHAGEAL ECHOCARDIOGRAM (TEE);  Surgeon: Kesi Spark, MD;  Location: [place];  Service:  Cardiovascular;  Laterality: N/A;  . TEE WITHOUT CARDIOVERSION N/A 11/04/2015   Procedure: TRANSESOPHAGEAL ECHOCARDIOGRAM (TEE);  Surgeon: Ivin Poot, MD;  Location: [place];  Service: Open Heart Surgery;  Laterality: N/A;  . TONSILLECTOMY      Family History  Problem Relation Age of Onset  . COPD Mother   . Diabetes Maternal Grandmother    Social History:  reports that she has been smoking Cigarettes.  She has a 2.50 pack-year smoking history. She has quit using smokeless tobacco. She reports that she does not drink alcohol or use drugs.  Allergies:  Allergies  Allergen Reactions  . Ultram [Tramadol] Hives    Medications:                                                                                                                           Current Facility-Administered Medications  Medication Dose Route Frequency Provider Last Rate Last Dose  . potassium chloride 10 mEq in 100 mL IVPB  10 mEq Intravenous Q1 Hr x 2 Tegeler, Gwenyth Allegra, MD 100 mL/hr at 06/03/17 1001 10 mEq at 06/03/17 1001  . sodium chloride 0.9 % bolus 250 mL  250 mL Intravenous Once Tegeler, Gwenyth Allegra, MD       Current Outpatient Prescriptions  Medication Sig Dispense Refill  . aspirin EC 81 MG EC tablet Take 1 tablet (81 mg total) by mouth daily.    . busPIRone (BUSPAR) 15 MG tablet Take 15 mg  by mouth 2 (two) times daily.     . butalbital-acetaminophen-caffeine (FIORICET, ESGIC) 50-325-40 MG tablet Take 1 tablet by mouth every 12 (twelve) hours as needed for headache. 15 tablet 1  . metoprolol tartrate (LOPRESSOR) 25 MG tablet Take 0.5 tablets (12.5 mg total) by mouth 2 (two) times daily. 90 tablet 3  . omeprazole (PRILOSEC) 40 MG capsule Take 1 capsule (40 mg total) by mouth daily. 30 capsule 0  . pravastatin (PRAVACHOL) 20 MG tablet Take 1 tablet (20 mg total) by mouth every evening. 90 tablet 3  . pregabalin (LYRICA) 75 MG capsule Take 75-150 mg by mouth 2 (two) times daily. 150mg  in the morning and 75mg  in the evening    . rOPINIRole (REQUIP) 0.25 MG tablet Take 0.25 mg by mouth 2 (two) times daily.    . sertraline (ZOLOFT) 50 MG tablet Take 50 mg by mouth daily.    . SUMAtriptan (IMITREX) 50 MG tablet Take 50 mg by mouth as needed for migraine.     . SYMBICORT 160-4.5 MCG/ACT inhaler Inhale 2 puffs into the lungs as needed.     Marland Kitchen tiZANidine (ZANAFLEX) 4 MG capsule Take 4 mg by mouth 3 (three) times daily as needed for muscle spasms.     . varenicline (CHANTIX) 1 MG tablet 1 mg 2 (two) times daily.    Marland Kitchen warfarin (COUMADIN) 5 MG tablet TAKE 1 TABLET BY MOUTH DAILY OR AS DIRECTED BY COUMADIN CLINIC 30 tablet 2  . zolpidem (AMBIEN)  10 MG tablet Take 10 mg by mouth at bedtime.        ROS:                                                                                                                                       History obtained from unobtainable from patient due to mental status   Neurologic Examination:                                                                                                      Blood pressure 126/86, pulse 87, temperature (!) 97.3 F (36.3 C), temperature source Oral, resp. rate (!) 21, SpO2 98 %.  HEENT-  Normocephalic, no lesions, without obvious abnormality.  Normal external eye and conjunctiva.  Normal TM's bilaterally.  Normal auditory  canals and external ears. Normal external nose, mucus membranes and septum.  Normal pharynx. Cardiovascular- S1, S2 normal, pulses palpable throughout   Lungs- chest clear, no wheezing, rales, normal symmetric air entry, Heart exam - S1, S2 normal, no murmur, no gallop, rate regular Abdomen- normal findings: bowel sounds normal Extremities- no edema Lymph-no adenopathy palpable Musculoskeletal-no joint tenderness, deformity or swelling Skin-warm and dry, no hyperpigmentation, vitiligo, or suspicious lesions  Neurological Examination Mental Status: Awakens to sternal rub and is very encephalopathic. moaning and moving all extremities.  Cranial Nerves: II: blinks to threat III,IV, VI:  extra-ocular motions intact bilaterally, pupils equal, round, reactive to light and accommodation V,VII: face symmetric, winces to noxious stimuli  VIII: opens eyes to voice IX,X: uvula rises symmetrically XII: midline tongue extension Motor: Moving all extremities antigravity Sensory: withdraws from pain Deep Tendon Reflexes: 1+ right KJ no left KJ no AJ 2+ bil UE Plantars: mute n       Lab Results: Basic Metabolic Panel:  Recent Labs Lab 06/03/17 0815  NA 141  K 3.6  CL 109  CO2 21*  GLUCOSE 105*  BUN 7  CREATININE 1.08*  CALCIUM 9.3    Liver Function Tests:  Recent Labs Lab 06/03/17 0815  AST 38  ALT 27  ALKPHOS 78  BILITOT 1.0  PROT 7.9  ALBUMIN 3.9    Recent Labs Lab 06/03/17 0815  LIPASE 30   No results for input(s): AMMONIA in the last 168 hours.  CBC:  Recent Labs Lab 06/03/17 0815  WBC 8.4  NEUTROABS 7.3  HGB 10.5*  HCT 34.0*  MCV 80.8  PLT 299    Cardiac Enzymes: No results for input(s): CKTOTAL,  CKMB, CKMBINDEX, TROPONINI in the last 168 hours.  Lipid Panel: No results for input(s): CHOL, TRIG, HDL, CHOLHDL, VLDL, LDLCALC in the last 168 hours.  CBG:  Recent Labs Lab 06/03/17 0729  GLUCAP 102*    Microbiology: Results for orders  placed or performed during the hospital encounter of 10/31/15  Surgical pcr screen     Status: Abnormal   Collection Time: 10/31/15  1:44 PM  Result Value Ref Range Status   MRSA, PCR POSITIVE (A) NEGATIVE Final    Comment: RESULT CALLED TO, READ BACK BY AND VERIFIED WITH: Stephan Minister RN 15:55 10/31/15 ([email or web address])    Staphylococcus aureus POSITIVE (A) NEGATIVE Final    Comment:        The Xpert SA Assay (FDA approved for NASAL specimens in patients over 37 years of age), is one component of a comprehensive surveillance program.  Test performance has been validated by [facility] for patients greater than or equal to 71 year old. It is not intended to diagnose infection nor to guide or monitor treatment.     Coagulation Studies:  Recent Labs  06/03/17 0815  LABPROT 13.3  INR 1.01    Imaging: Dg Chest 2 View  Result Date: 06/03/2017 CLINICAL DATA:  Altered mental status EXAM: CHEST  2 VIEW COMPARISON:  12/04/2015 FINDINGS: Pacer remains in place, unchanged. Prior median sternotomy. Heart and mediastinal contours are within normal limits. No focal opacities or effusions. No acute bony abnormality. IMPRESSION: No active cardiopulmonary disease. Electronically Signed   By: Rolm Baptise M.D.   On: 06/03/2017 08:50   Ct Head Wo Contrast  Result Date: 06/03/2017 CLINICAL DATA:  Found on floor.  Altered level of consciousness. EXAM: CT HEAD WITHOUT CONTRAST CT CERVICAL SPINE WITHOUT CONTRAST TECHNIQUE: Multidetector CT imaging of the head and cervical spine was performed following the standard protocol without intravenous contrast. Multiplanar CT image reconstructions of the cervical spine were also generated. COMPARISON:  10/17/2015 FINDINGS: CT HEAD FINDINGS Brain: Diffuse cerebral atrophy. Mild chronic microvascular changes throughout the deep white matter. Low-density noted in the left posterior frontal lobe and anterior parietal lobe, new since prior study. This could reflect acute  or subacute infarct. No hemorrhage. No hydrocephalus. Vascular: No hyperdense vessel or unexpected calcification. Skull: No acute calvarial abnormality. Sinuses/Orbits: Visualized paranasal sinuses and mastoids clear. Orbital soft tissues unremarkable. Other: None CT CERVICAL SPINE FINDINGS Alignment: Normal Skull base and vertebrae: No fractures Soft tissues and spinal canal: Prevertebral soft tissues are normal. No epidural or paraspinal hematoma. Disc levels: Degenerative facet disease bilaterally. Disc spaces maintained. Upper chest: Negative Other: None IMPRESSION: Cortical and subcortical low-density in the posterior left frontal lobe and anterior left parietal lobe concerning for acute to subacute infarction. Atrophy, chronic small vessel disease. No acute bony abnormality in the cervical spine. Electronically Signed   By: Rolm Baptise M.D.   On: 06/03/2017 09:17   Ct Cervical Spine Wo Contrast  Result Date: 06/03/2017 CLINICAL DATA:  Found on floor.  Altered level of consciousness. EXAM: CT HEAD WITHOUT CONTRAST CT CERVICAL SPINE WITHOUT CONTRAST TECHNIQUE: Multidetector CT imaging of the head and cervical spine was performed following the standard protocol without intravenous contrast. Multiplanar CT image reconstructions of the cervical spine were also generated. COMPARISON:  10/17/2015 FINDINGS: CT HEAD FINDINGS Brain: Diffuse cerebral atrophy. Mild chronic microvascular changes throughout the deep white matter. Low-density noted in the left posterior frontal lobe and anterior parietal lobe, new since prior study. This could reflect acute or subacute infarct. No  hemorrhage. No hydrocephalus. Vascular: No hyperdense vessel or unexpected calcification. Skull: No acute calvarial abnormality. Sinuses/Orbits: Visualized paranasal sinuses and mastoids clear. Orbital soft tissues unremarkable. Other: None CT CERVICAL SPINE FINDINGS Alignment: Normal Skull base and vertebrae: No fractures Soft tissues and  spinal canal: Prevertebral soft tissues are normal. No epidural or paraspinal hematoma. Disc levels: Degenerative facet disease bilaterally. Disc spaces maintained. Upper chest: Negative Other: None IMPRESSION: Cortical and subcortical low-density in the posterior left frontal lobe and anterior left parietal lobe concerning for acute to subacute infarction. Atrophy, chronic small vessel disease. No acute bony abnormality in the cervical spine. Electronically Signed   By: Rolm Baptise M.D.   On: 06/03/2017 09:17       Assessment and plan discussed with with attending physician and they are in agreement.    Etta Quill PA-C [facility] [phone]  06/03/2017, 11:00 AM   Assessment: 66 y.o. female presenting with aphasia 2/2 possible acute CVA vs overdose of benadryl.  She is not a tPA candidate or interventional candidate as she was outside the window and exam does not appear to suggest LVO.   Stroke Risk Factors - hypertension   Recommend: MRI brain--if positive, obtain MRA head and carotid ultrasound  Continue aspirin and statin Neurochecks and NIHSS  every 4- 6 hours  NEUROHOSPITALIST ADDENDUM Seen and examined the patient this AM. Formulated plan as documented above. Recommendations as above. Will follow.  Karena Addison Aroor MD [facility] [phone]  If 7pm to 7am, please call on call as listed on AMION.

## 2017-06-03 NOTE — ED Notes (Signed)
Pts family at bedside. They report patient LSN last night at 11pm eating a cucumber. Pt normally verbal, oriented x4, and ambulatory. Per family todays neurological change is new.

## 2017-06-03 NOTE — ED Notes (Signed)
Pts son removed jewelry before pt transported to MRI.

## 2017-06-03 NOTE — ED Notes (Signed)
Pt transported to inpt room from CT.

## 2017-06-03 NOTE — ED Notes (Signed)
[name] daughter requested that the IV be reset, as well as for the MD to come talk to her.

## 2017-06-03 NOTE — ED Provider Notes (Signed)
[facility] DEPT Provider Note   CSN: [id number] Arrival date & time: 06/03/17  0719     History   Chief Complaint Chief Complaint  Patient presents with  . Altered Mental Status    HPI Julia Schwartz is a 66 y.o. female.  The history is provided by the EMS personnel, a relative and medical records. The history is limited by the condition of the patient.  Neurologic Problem  This is a new problem. The current episode started 12 to 24 hours ago. The problem occurs constantly. The problem has not changed since onset.Nothing aggravates the symptoms. Nothing relieves the symptoms. She has tried nothing for the symptoms. The treatment provided no relief.   LVL 5 caveat for AMS and aphasia of patient  Past Medical History:  Diagnosis Date  . Anxiety   . Arthritis   . 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 Echo 2/17:  Mild focal basal septal hypertrophy, EF 50-55%, no RWMA, Gr 1 DD, trivial MR, normal RVF, mild TR, pericardial patch in interatrial septum patent - no residual flow  . Chronic back pain   . Depression   . Fibromyalgia   . GERD (gastroesophageal reflux disease)   . Hepatitis    hep  c    dx 2007  . Hypertension   . Stroke [facility])    TIA came to er on 1/3  some deficits in right hand  . Wears dentures    top-partial bottom  . Wears glasses     Patient Active Problem List   Diagnosis Date Noted  . Elevated LFTs 09/04/2016  . Elevated serum creatinine 09/04/2016  . Chronic anticoagulation 04/28/2016  . Paroxysmal atrial fibrillation ([place]) 04/28/2016  . Tension headache 04/28/2016  . Encounter for long-term (current) use of high-risk medication 01/10/2016  . Cerebrovascular accident (CVA) due to embolism of cerebral artery ([place]) 01/08/2016  . PAF (paroxysmal atrial fibrillation) ([place]) 11/21/2015  . 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 Stroke ([place])   . Tobacco abuse   . Fibromyalgia   . Hypomagnesemia   . Hypokalemia   . Myxoma of heart   .  Hyperlipidemia   . Tobacco use disorder   . Embolic stroke ([place]) 66/29/4765  . HTN (hypertension) 10/17/2015    Past Surgical History:  Procedure Laterality Date  . ABDOMINAL HYSTERECTOMY    . BUNIONECTOMY     both feet  . CARDIAC CATHETERIZATION N/A 10/23/2015   Procedure: Left Heart Cath and Coronary Angiography;  Surgeon: Belva Crome, MD;  Location: [place] CV LAB;  Service: Cardiovascular;  Laterality: N/A;  . EP IMPLANTABLE DEVICE N/A 11/14/2015   Procedure: Pacemaker Implant;  Surgeon: Deboraha Sprang, MD;  Location: [place] [facility] LAB;  Service: Cardiovascular;  Laterality: N/A;  . EXCISION OF ATRIAL MYXOMA N/A 11/04/2015   Procedure: RESECTION OF LEFT ATRIAL MYXOMA, BI-ATRIAL APPROACH WITH PERICARDIAL PATCH;  Surgeon: Ivin Poot, MD;  Location: [place];  Service: Open Heart Surgery;  Laterality: N/A;  . FRACTURE SURGERY     fx lt ankle  . SUBMANDIBULAR GLAND EXCISION     left  . TEE WITHOUT CARDIOVERSION N/A 10/18/2015   Procedure: TRANSESOPHAGEAL ECHOCARDIOGRAM (TEE);  Surgeon: Amethyst Spark, MD;  Location: [place];  Service: Cardiovascular;  Laterality: N/A;  . TEE WITHOUT CARDIOVERSION N/A 11/04/2015   Procedure: TRANSESOPHAGEAL ECHOCARDIOGRAM (TEE);  Surgeon: Ivin Poot, MD;  Location: [place];  Service: Open Heart Surgery;  Laterality: N/A;  . TONSILLECTOMY  OB History    No data available       Home Medications    Prior to Admission medications   Medication Sig Start Date End Date Taking? Authorizing Provider  aspirin EC 81 MG EC tablet Take 1 tablet (81 mg total) by mouth daily. 11/09/15   Nani Skillern, PA-C  busPIRone (BUSPAR) 15 MG tablet Take 15 mg by mouth 2 (two) times daily.     [provider]  butalbital-acetaminophen-caffeine (FIORICET, ESGIC) [phone] MG tablet Take 1 tablet by mouth every 12 (twelve) hours as needed for headache. 04/28/16   Rosalin Hawking, MD  metoprolol tartrate (LOPRESSOR) 25 MG tablet Take 0.5  tablets (12.5 mg total) by mouth 2 (two) times daily. 09/03/16 12/18/16  Gerrie Spark, MD  omeprazole (PRILOSEC) 40 MG capsule Take 1 capsule (40 mg total) by mouth daily. 10/24/15   Ghimire, Henreitta Leber, MD  pravastatin (PRAVACHOL) 20 MG tablet Take 1 tablet (20 mg total) by mouth every evening. 10/06/16 01/04/17  Yaa Spark, MD  pregabalin (LYRICA) 75 MG capsule Take 75-150 mg by mouth 2 (two) times daily. 150mg  in the morning and 75mg  in the evening    [provider]  rOPINIRole (REQUIP) 0.25 MG tablet Take 0.25 mg by mouth 2 (two) times daily.    [provider]  sertraline (ZOLOFT) 50 MG tablet Take 50 mg by mouth daily.    [provider]  SUMAtriptan (IMITREX) 50 MG tablet Take 50 mg by mouth as needed for migraine.  12/27/15   [provider]  SYMBICORT 160-4.5 MCG/ACT inhaler Inhale 2 puffs into the lungs as needed.  12/02/15   [provider]  tiZANidine (ZANAFLEX) 4 MG capsule Take 4 mg by mouth 3 (three) times daily as needed for muscle spasms.     [provider]  varenicline (CHANTIX) 1 MG tablet 1 mg 2 (two) times daily.    [provider]  warfarin (COUMADIN) 5 MG tablet TAKE 1 TABLET BY MOUTH DAILY OR AS DIRECTED BY COUMADIN CLINIC 10/21/16   Holden Spark, MD  zolpidem (AMBIEN) 10 MG tablet Take 10 mg by mouth at bedtime.     [provider]    Family History Family History  Problem Relation Age of Onset  . COPD Mother   . Diabetes Maternal Grandmother     Social History Social History  Substance Use Topics  . Smoking status: Current Every Day Smoker    Packs/day: 0.25    Years: 10.00    Types: Cigarettes  . Smokeless tobacco: Former Systems developer     Comment: smokes 1 to 2 trying to quit  . Alcohol use No     Allergies   Ultram [tramadol]   Review of Systems Review of Systems  Unable to perform ROS: Mental status change     Physical Exam Updated Vital Signs BP (!) 128/92 (BP  Location: Left Arm)   Pulse 89   Temp (!) 97.3 F (36.3 C) (Oral)   Resp (!) 28   SpO2 97%   Physical Exam  Constitutional: She appears well-developed and well-nourished. No distress.  HENT:  Nose: Nose normal.  Mouth/Throat: No oropharyngeal exudate.  Pink powder all over face. Red tongue with pink powder.   Neck: Normal range of motion.  Cardiovascular: Normal rate and intact distal pulses.   Murmur heard. Pulmonary/Chest: Effort normal. No stridor. No respiratory distress. She has no rales. She exhibits no tenderness.  Abdominal: Soft. There is no tenderness.  Musculoskeletal: She exhibits no tenderness.  Neurological: She is alert. She displays normal reflexes. No sensory deficit. She exhibits normal muscle tone. GCS eye subscore is 4. GCS verbal subscore is 1. GCS motor subscore is 6.  PT moving all extremities. Nonverbal. Moving eyes in all directions. Pt did follow commands with all extremities intermittently.   Coordination not assessed. PT withdrew to pain.   Skin: She is not diaphoretic.  Nursing note and vitals reviewed.    ED Treatments / Results  Labs (all labs ordered are listed, but only abnormal results are displayed) Labs Reviewed  CBC WITH DIFFERENTIAL/PLATELET - Abnormal; Notable for the following:       Result Value   Hemoglobin 10.5 (*)    HCT 34.0 (*)    MCH 24.9 (*)    RDW 16.6 (*)    Lymphs Abs 0.6 (*)    All other components within normal limits  COMPREHENSIVE METABOLIC PANEL - Abnormal; Notable for the following:    CO2 21 (*)    Glucose, Bld 105 (*)    Creatinine, Ser 1.08 (*)    GFR calc non Af Amer 53 (*)    All other components within normal limits  URINALYSIS, ROUTINE W REFLEX MICROSCOPIC - Abnormal; Notable for the following:    Color, Urine COLORLESS (*)    Specific Gravity, Urine 1.003 (*)    All other components within normal limits  ACETAMINOPHEN LEVEL - Abnormal; Notable for the following:    Acetaminophen (Tylenol), Serum <10  (*)    All other components within normal limits  MAGNESIUM - Abnormal; Notable for the following:    Magnesium 1.6 (*)    All other components within normal limits  CBG MONITORING, ED - Abnormal; Notable for the following:    Glucose-Capillary 102 (*)    All other components within normal limits  I-STAT CG4 LACTIC ACID, ED - Abnormal; Notable for the following:    Lactic Acid, Venous 2.42 (*)    All other components within normal limits  I-STAT VENOUS BLOOD GAS, ED - Abnormal; Notable for the following:    pCO2, Ven 38.8 (*)    pO2, Ven 70.0 (*)    All other components within normal limits  URINE CULTURE  CULTURE, BLOOD (ROUTINE X 2)  CULTURE, BLOOD (ROUTINE X 2)  LIPASE, BLOOD  PROTIME-INR  LACTIC ACID, PLASMA  LACTIC ACID, PLASMA  PROCALCITONIN  APTT  SALICYLATE LEVEL  BLOOD GAS, VENOUS  HIV ANTIBODY (ROUTINE TESTING)  CK  CK  PHOSPHORUS  I-STAT TROPONIN, ED  I-STAT CG4 LACTIC ACID, ED  I-STAT TROPONIN, ED    EKG  EKG Interpretation  Date/Time:  Thursday June 03 2017 07:30:48 EDT Ventricular Rate:  88 PR Interval:    QRS Duration: 95 QT Interval:  430 QTC Calculation: 521 R Axis:   81 Text Interpretation:  Sinus rhythm Short PR interval Borderline right axis deviation Borderline T wave abnormalities Prolonged QT interval When compared to prior, no significant changes seen.  no STEMI Confirmed by Antony Blackbird [phone]) on 06/03/2017 8:17:59 AM       Radiology Dg Chest 2 View  Result Date: 06/03/2017 CLINICAL DATA:  Altered mental status EXAM: CHEST  2 VIEW COMPARISON:  12/04/2015 FINDINGS: Pacer remains in place, unchanged. Prior median sternotomy. Heart and mediastinal contours are within normal limits. No focal opacities or effusions. No acute bony abnormality. IMPRESSION: No active cardiopulmonary disease. Electronically Signed   By: Rolm Baptise M.D.   On: 06/03/2017 08:50  Ct Head Wo Contrast  Result Date: 06/03/2017 CLINICAL DATA:  Found on floor.   Altered level of consciousness. EXAM: CT HEAD WITHOUT CONTRAST CT CERVICAL SPINE WITHOUT CONTRAST TECHNIQUE: Multidetector CT imaging of the head and cervical spine was performed following the standard protocol without intravenous contrast. Multiplanar CT image reconstructions of the cervical spine were also generated. COMPARISON:  10/17/2015 FINDINGS: CT HEAD FINDINGS Brain: Diffuse cerebral atrophy. Mild chronic microvascular changes throughout the deep white matter. Low-density noted in the left posterior frontal lobe and anterior parietal lobe, new since prior study. This could reflect acute or subacute infarct. No hemorrhage. No hydrocephalus. Vascular: No hyperdense vessel or unexpected calcification. Skull: No acute calvarial abnormality. Sinuses/Orbits: Visualized paranasal sinuses and mastoids clear. Orbital soft tissues unremarkable. Other: None CT CERVICAL SPINE FINDINGS Alignment: Normal Skull base and vertebrae: No fractures Soft tissues and spinal canal: Prevertebral soft tissues are normal. No epidural or paraspinal hematoma. Disc levels: Degenerative facet disease bilaterally. Disc spaces maintained. Upper chest: Negative Other: None IMPRESSION: Cortical and subcortical low-density in the posterior left frontal lobe and anterior left parietal lobe concerning for acute to subacute infarction. Atrophy, chronic small vessel disease. No acute bony abnormality in the cervical spine. Electronically Signed   By: Rolm Baptise M.D.   On: 06/03/2017 09:17   Ct Cervical Spine Wo Contrast  Result Date: 06/03/2017 CLINICAL DATA:  Found on floor.  Altered level of consciousness. EXAM: CT HEAD WITHOUT CONTRAST CT CERVICAL SPINE WITHOUT CONTRAST TECHNIQUE: Multidetector CT imaging of the head and cervical spine was performed following the standard protocol without intravenous contrast. Multiplanar CT image reconstructions of the cervical spine were also generated. COMPARISON:  10/17/2015 FINDINGS: CT HEAD  FINDINGS Brain: Diffuse cerebral atrophy. Mild chronic microvascular changes throughout the deep white matter. Low-density noted in the left posterior frontal lobe and anterior parietal lobe, new since prior study. This could reflect acute or subacute infarct. No hemorrhage. No hydrocephalus. Vascular: No hyperdense vessel or unexpected calcification. Skull: No acute calvarial abnormality. Sinuses/Orbits: Visualized paranasal sinuses and mastoids clear. Orbital soft tissues unremarkable. Other: None CT CERVICAL SPINE FINDINGS Alignment: Normal Skull base and vertebrae: No fractures Soft tissues and spinal canal: Prevertebral soft tissues are normal. No epidural or paraspinal hematoma. Disc levels: Degenerative facet disease bilaterally. Disc spaces maintained. Upper chest: Negative Other: None IMPRESSION: Cortical and subcortical low-density in the posterior left frontal lobe and anterior left parietal lobe concerning for acute to subacute infarction. Atrophy, chronic small vessel disease. No acute bony abnormality in the cervical spine. Electronically Signed   By: Rolm Baptise M.D.   On: 06/03/2017 09:17    Procedures Procedures (including critical care time)  CRITICAL CARE Performed by: Gwenyth Allegra Jayma Volpi Total critical care time: 50 minutes Critical care time was exclusive of separately billable procedures and treating other patients. Critical care was necessary to treat or prevent imminent or life-threatening deterioration. Frequent seizures requiring close monitoring and consultation with neurology. Also speaking with multiple consultants including neurology and poison control team.  Critical care was time spent personally by me on the following activities: development of treatment plan with patient and/or surrogate as well as nursing, discussions with consultants, evaluation of patient's response to treatment, examination of patient, obtaining history from patient or surrogate, ordering and  performing treatments and interventions, ordering and review of laboratory studies, ordering and review of radiographic studies, pulse oximetry and re-evaluation of patient's condition.  EMERGENCY DEPARTMENT  US GUIDANCE EXAM Emergency Ultrasound:  US Guidance for Needle Guidance  INDICATIONS: Difficult vascular access Linear probe used in real-time to visualize location of needle entry through skin.   PERFORMED BY: Myself IMAGES ARCHIVED?: Yes LIMITATIONS: None VIEWS USED: Transverse INTERPRETATION: Needle visualized within vein, Left arm and Needle gauge 20   Medications Ordered in ED Medications   stroke: mapping our early stages of recovery book (not administered)  0.9 %  sodium chloride infusion (50 mL/hr Intravenous New Bag/Given 06/03/17 1400)  acetaminophen (TYLENOL) tablet 650 mg (not administered)    Or  acetaminophen (TYLENOL) solution 650 mg (not administered)    Or  acetaminophen (TYLENOL) suppository 650 mg (not administered)  senna-docusate (Senokot-S) tablet 1 tablet (not administered)  aspirin suppository 300 mg (300 mg Rectal Given 06/03/17 1401)  heparin injection 5,000 Units (not administered)  sodium chloride 0.9 % bolus 1,000 mL (0 mLs Intravenous Stopped 06/03/17 0936)  Tdap (BOOSTRIX) injection 0.5 mL (0.5 mLs Intramuscular Given 06/03/17 0950)  sodium chloride 0.9 % bolus 1,000 mL (0 mLs Intravenous Stopped 06/03/17 1014)    And  sodium chloride 0.9 % bolus 1,000 mL (0 mLs Intravenous Stopped 06/03/17 1130)    And  sodium chloride 0.9 % bolus 250 mL (0 mLs Intravenous Stopped 06/03/17 1136)  potassium chloride 10 mEq in 100 mL IVPB (0 mEq Intravenous Stopped 06/03/17 1229)  magnesium sulfate IVPB 2 g 50 mL (0 g Intravenous Stopped 06/03/17 1459)     Initial Impression / Assessment and Plan / ED Course  I have reviewed the triage vital signs and the nursing notes.  Pertinent labs & imaging results that were available during my care of the patient were reviewed  by me and considered in my medical decision making (see chart for details).     Aara Jacquot is a 66 y.o. female with a past medical history significant for hypertension, stroke, atrial fibrillation not on anticoagulation, prior heart myxoma status post surgery, pacemaker and  Depression who presents for altered mental status. According to family, patient was last normal last night. They report that patient was found after a fall this morning around 5 AM. Patient brought in emergently for evaluation. On arrival, patient is aphasic. Patient was following commands with upper and lower extremities. Patient was protecting her airway and oxygen saturations were in the mid 90s on room air. Patient was noted to have a pink powder on her face and her tongue was red.  Given patient's aphasia, history of stroke, and no anticoagulation currently, there is clinical concern for stroke. CT of the head was ordered showing possible acute versus subacute stroke. Neurology team called.  While awaiting neurology evaluation, family arrived describing that patient has been depressed over the last few days as it is the one-year anniversary of her son's death yesterday. They report that she had made some suicidal comments. They also report that the patient was found to have Benadryl bottles and pills around her in the bedroom. Suspect that the pink dust was Benadryl on her face. Family is concerned about intentional overdose.  The poison control team was called and they recommended observation to follow the patient's EKG and labs. Initial EKG did show concern for prolonged QTC. They recommended repletion of the patient's magnesium and potassium. QRS was not prolonged. They did not recommend sodium bicarbonate at this time until that was prolonged.   Neurology felt patient needs MRI and CTAs of the head and neck. Patient has a pacemaker and will not be able to emergently get MRI however this may be done as an  inpatient  according to the radiology tech report to nursing. Patient will have CTAs ordered. Ultrasound IV is placed by me to facilitate CTA.  Patient also noted to have a small laceration on her left ear. Tetanus will be updated. Anticipate reevaluation of the laceration after mental status, overdose, and possible stroke have been managed.   Laceration was reexamined and appeared to be a superficial small laceration to the left posterior ear. Wound is hemostatic and is well approximated. Wound is already scabbing. Do not feel patient needs stitches at this time.  Neurology requested admission to the hospitalist service for further management. Patient will be admitted to hospitalist service. A sitter was at the bedside due to the concern for suicide attempt. Patient will likely need to speak with behavioral health team after she improves from her altered mental status.    Final Clinical Impressions(s) / ED Diagnoses   Final diagnoses:  Altered mental status, unspecified altered mental status type  Seizures (HCC)  Diphenhydramine overdose, intentional self-harm, initial encounter ([place])    Clinical Impression: 1. Altered mental status, unspecified altered mental status type   2. Seizures ([place])   3. Diphenhydramine overdose, intentional self-harm, initial encounter [facility])     Disposition: Admit to Hospitalist service    Dajia Gunnels, Gwenyth Allegra, MD 06/03/17 2114

## 2017-06-03 NOTE — H&P (Signed)
[facility] History and Physical  Ashlynne Shetterly DJS:[id number] DOB: Jul 16, 1951 DOA: 06/03/2017  Referring physician:  PCP: Ricke Hey, MD   Chief Complaint: found unconscious   HPI: Julia Schwartz is a 66 y.o. female  past medical history of anxiety, depression, for myalgia, hepatitis C and transient ischemic attack who presents emergency room with being found down. Due to altered mental status patient cannot give history. Per report patient was found by family in the house with liquid Benadryl at the scene. It is believed the patient overdosed on Benadryl last night. Unknown how long patient was down. Patient had the death of a lot of 1 anniversary recently. Patient had been depressed.  ED course: CT head showed stroke. Neurology consult on the patient. Hospitalist consulted for admission.   Review of Systems:  As per HPI otherwise 10 point review of systems negative.    Past Medical History:  Diagnosis Date  . Anxiety   . Arthritis   . 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 Echo 2/17:  Mild focal basal septal hypertrophy, EF 50-55%, no RWMA, Gr 1 DD, trivial MR, normal RVF, mild TR, pericardial patch in interatrial septum patent - no residual flow  . Chronic back pain   . Depression   . Fibromyalgia   . GERD (gastroesophageal reflux disease)   . Hepatitis    hep  c    dx 2007  . Hypertension   . Stroke [facility])    TIA came to er on 1/3  some deficits in right hand  . Wears dentures    top-partial bottom  . Wears glasses    Past Surgical History:  Procedure Laterality Date  . ABDOMINAL HYSTERECTOMY    . BUNIONECTOMY     both feet  . CARDIAC CATHETERIZATION N/A 10/23/2015   Procedure: Left Heart Cath and Coronary Angiography;  Surgeon: Belva Crome, MD;  Location: [place] [facility];  Service: Cardiovascular;  Laterality: N/A;  . EP IMPLANTABLE DEVICE N/A 11/14/2015   Procedure: Pacemaker Implant;  Surgeon: Deboraha Sprang, MD;  Location: [place]  [facility];  Service: Cardiovascular;  Laterality: N/A;  . EXCISION OF ATRIAL MYXOMA N/A 11/04/2015   Procedure: RESECTION OF LEFT ATRIAL MYXOMA, BI-ATRIAL APPROACH WITH PERICARDIAL PATCH;  Surgeon: Ivin Poot, MD;  Location: [place];  Service: Open Heart Surgery;  Laterality: N/A;  . FRACTURE SURGERY     fx lt ankle  . SUBMANDIBULAR GLAND EXCISION     left  . TEE WITHOUT CARDIOVERSION N/A 10/18/2015   Procedure: TRANSESOPHAGEAL ECHOCARDIOGRAM (TEE);  Surgeon: Yoanna Spark, MD;  Location: [place];  Service: Cardiovascular;  Laterality: N/A;  . TEE WITHOUT CARDIOVERSION N/A 11/04/2015   Procedure: TRANSESOPHAGEAL ECHOCARDIOGRAM (TEE);  Surgeon: Ivin Poot, MD;  Location: [place];  Service: Open Heart Surgery;  Laterality: N/A;  . TONSILLECTOMY     Social History:  reports that she has been smoking Cigarettes.  She has a 2.50 pack-year smoking history. She has quit using smokeless tobacco. She reports that she does not drink alcohol or use drugs.  Allergies  Allergen Reactions  . Ultram [Tramadol] Hives    Family History  Problem Relation Age of Onset  . COPD Mother   . Diabetes Maternal Grandmother      Prior to Admission medications   Medication Sig Start Date End Date Taking? Authorizing Provider  amLODipine-benazepril (LOTREL) 10-40 MG capsule Take 1 capsule by mouth daily.   Yes [provider]  aspirin EC 81 MG EC tablet Take 1 tablet (81 mg total) by mouth daily. 11/09/15  Yes Lars Pinks M, PA-C  atorvastatin (LIPITOR) 20 MG tablet Take 20 mg by mouth daily.   Yes [provider]  busPIRone (BUSPAR) 15 MG tablet Take 15 mg by mouth 3 (three) times daily.    Yes [provider]  Cholecalciferol (VITAMIN D3) 2000 units capsule Take 2,000 Units by mouth daily.   Yes [provider]  cyclobenzaprine (FLEXERIL) 10 MG tablet Take 10 mg by mouth 3 (three) times daily as needed for muscle spasms.   Yes [provider]    diclofenac (VOLTAREN) 75 MG EC tablet Take 75 mg by mouth 2 (two) times daily.   Yes [provider]  diphenhydrAMINE (BENADRYL) 25 MG tablet Take 25 mg by mouth every 6 (six) hours as needed for itching.   Yes [provider]  DULoxetine (CYMBALTA) 60 MG capsule Take 60 mg by mouth daily.   Yes [provider]  furosemide (LASIX) 20 MG tablet Take 20 mg by mouth daily.   Yes [provider]  HYDROcodone-acetaminophen (NORCO) 10-325 MG tablet Take 1 tablet by mouth 3 (three) times daily as needed for moderate pain.   Yes [provider]  naloxegol oxalate (MOVANTIK) 12.5 MG TABS tablet Take 25 mg by mouth daily.   Yes [provider]  omeprazole (PRILOSEC) 40 MG capsule Take 1 capsule (40 mg total) by mouth daily. 10/24/15  Yes Ghimire, Henreitta Leber, MD  pravastatin (PRAVACHOL) 20 MG tablet Take 1 tablet (20 mg total) by mouth every evening. 10/06/16 06/03/17 Yes Aisha Spark, MD  pregabalin (LYRICA) 75 MG capsule Take 75 mg by mouth 2 (two) times daily.    Yes [provider]  rOPINIRole (REQUIP) 0.25 MG tablet Take 0.25 mg by mouth 2 (two) times daily.   Yes [provider]  SYMBICORT 160-4.5 MCG/ACT inhaler Inhale 2 puffs into the lungs as needed.  12/02/15  Yes [provider]  tiZANidine (ZANAFLEX) 4 MG capsule Take 4 mg by mouth every 8 (eight) hours as needed for muscle spasms.    Yes [provider]  varenicline (CHANTIX) 1 MG tablet 1 mg 2 (two) times daily.   Yes [provider]  warfarin (COUMADIN) 5 MG tablet TAKE 1 TABLET BY MOUTH DAILY OR AS DIRECTED BY COUMADIN CLINIC 10/21/16  Yes Kinzlee Spark, MD  zolpidem (AMBIEN) 10 MG tablet Take 10 mg by mouth at bedtime.    Yes [provider]  butalbital-acetaminophen-caffeine (FIORICET, ESGIC) 50-325-40 MG tablet Take 1 tablet by mouth every 12 (twelve) hours as needed for headache. Patient not taking: Reported on 06/03/2017 04/28/16    Rosalin Hawking, MD  metoprolol tartrate (LOPRESSOR) 25 MG tablet Take 0.5 tablets (12.5 mg total) by mouth 2 (two) times daily. 09/03/16 12/18/16  Xzaria Spark, MD   Physical Exam: Vitals:   06/03/17 1000 06/03/17 1045 06/03/17 1145 06/03/17 1230  BP: (!) 143/97 (!) 135/120 (!) 112/96 (!) 150/128  Pulse: 92 85 80 86  Resp: (!) 22 (!) 22 (!) 21 (!) 29  Temp:      TempSrc:      SpO2: 100% 100% 100% 99%    Wt Readings from Last 3 Encounters:  12/18/16 74.4 kg (164 lb)  12/09/16 77.1 kg (170 lb)  09/03/16 74.8 kg (164 lb 12.8 oz)    General:  Appearscomfortable; GCS 12 Eyes:  PERRL, EOMI, normal lids, iris ENT:  grossly normal hearing, lips & tongue Neck:  no LAD, masses or thyromegaly Cardiovascular:  RRR, no m/r/g. No LE edema.  Respiratory:  CTA bilaterally, no w/r/r. Normal respiratory effort. Abdomen:  soft, ntnd Skin:  no rash or induration seen on limited exam Musculoskeletal:  grossly normal tone BUE/BLE Psychiatric:  sedated Neurologic:  CN 2-12 grossly intact, moves all extremities in coordinated fashion.          Labs on Admission:  Basic Metabolic Panel:  Recent Labs Lab 06/03/17 0815 06/03/17 1021  NA 141  --   K 3.6  --   CL 109  --   CO2 21*  --   GLUCOSE 105*  --   BUN 7  --   CREATININE 1.08*  --   CALCIUM 9.3  --   MG  --  1.6*   Liver Function Tests:  Recent Labs Lab 06/03/17 0815  AST 38  ALT 27  ALKPHOS 78  BILITOT 1.0  PROT 7.9  ALBUMIN 3.9    Recent Labs Lab 06/03/17 0815  LIPASE 30   No results for input(s): AMMONIA in the last 168 hours. CBC:  Recent Labs Lab 06/03/17 0815  WBC 8.4  NEUTROABS 7.3  HGB 10.5*  HCT 34.0*  MCV 80.8  PLT 299   Cardiac Enzymes: No results for input(s): CKTOTAL, CKMB, CKMBINDEX, TROPONINI in the last 168 hours.  BNP (last 3 results) No results for input(s): BNP in the last 8760 hours.  ProBNP (last 3 results) No results for input(s): PROBNP in the last 8760 hours.   Creatinine  clearance cannot be calculated (Unknown ideal weight.)  CBG:  Recent Labs Lab 06/03/17 0729  GLUCAP 102*    Radiological Exams on Admission: Dg Chest 2 View  Result Date: 06/03/2017 CLINICAL DATA:  Altered mental status EXAM: CHEST  2 VIEW COMPARISON:  12/04/2015 FINDINGS: Pacer remains in place, unchanged. Prior median sternotomy. Heart and mediastinal contours are within normal limits. No focal opacities or effusions. No acute bony abnormality. IMPRESSION: No active cardiopulmonary disease. Electronically Signed   By: Rolm Baptise M.D.   On: 06/03/2017 08:50   Ct Head Wo Contrast  Result Date: 06/03/2017 CLINICAL DATA:  Found on floor.  Altered level of consciousness. EXAM: CT HEAD WITHOUT CONTRAST CT CERVICAL SPINE WITHOUT CONTRAST TECHNIQUE: Multidetector CT imaging of the head and cervical spine was performed following the standard protocol without intravenous contrast. Multiplanar CT image reconstructions of the cervical spine were also generated. COMPARISON:  10/17/2015 FINDINGS: CT HEAD FINDINGS Brain: Diffuse cerebral atrophy. Mild chronic microvascular changes throughout the deep white matter. Low-density noted in the left posterior frontal lobe and anterior parietal lobe, new since prior study. This could reflect acute or subacute infarct. No hemorrhage. No hydrocephalus. Vascular: No hyperdense vessel or unexpected calcification. Skull: No acute calvarial abnormality. Sinuses/Orbits: Visualized paranasal sinuses and mastoids clear. Orbital soft tissues unremarkable. Other: None CT CERVICAL SPINE FINDINGS Alignment: Normal Skull base and vertebrae: No fractures Soft tissues and spinal canal: Prevertebral soft tissues are normal. No epidural or paraspinal hematoma. Disc levels: Degenerative facet disease bilaterally. Disc spaces maintained. Upper chest: Negative Other: None IMPRESSION: Cortical and subcortical low-density in the posterior left frontal lobe and anterior left parietal lobe  concerning for acute to subacute infarction. Atrophy, chronic small vessel disease. No acute bony abnormality in the cervical spine. Electronically Signed   By: Rolm Baptise M.D.   On: 06/03/2017 09:17   Ct Cervical Spine Wo Contrast  Result Date: 06/03/2017 CLINICAL  DATA:  Found on floor.  Altered level of consciousness. EXAM: CT HEAD WITHOUT CONTRAST CT CERVICAL SPINE WITHOUT CONTRAST TECHNIQUE: Multidetector CT imaging of the head and cervical spine was performed following the standard protocol without intravenous contrast. Multiplanar CT image reconstructions of the cervical spine were also generated. COMPARISON:  10/17/2015 FINDINGS: CT HEAD FINDINGS Brain: Diffuse cerebral atrophy. Mild chronic microvascular changes throughout the deep white matter. Low-density noted in the left posterior frontal lobe and anterior parietal lobe, new since prior study. This could reflect acute or subacute infarct. No hemorrhage. No hydrocephalus. Vascular: No hyperdense vessel or unexpected calcification. Skull: No acute calvarial abnormality. Sinuses/Orbits: Visualized paranasal sinuses and mastoids clear. Orbital soft tissues unremarkable. Other: None CT CERVICAL SPINE FINDINGS Alignment: Normal Skull base and vertebrae: No fractures Soft tissues and spinal canal: Prevertebral soft tissues are normal. No epidural or paraspinal hematoma. Disc levels: Degenerative facet disease bilaterally. Disc spaces maintained. Upper chest: Negative Other: None IMPRESSION: Cortical and subcortical low-density in the posterior left frontal lobe and anterior left parietal lobe concerning for acute to subacute infarction. Atrophy, chronic small vessel disease. No acute bony abnormality in the cervical spine. Electronically Signed   By: Rolm Baptise M.D.   On: 06/03/2017 09:17    EKG: Independently reviewed. Normal sinus rhythm. Prolonged QT.  Assessment/Plan Active Problems:   HTN (hypertension)   Hyperlipidemia   Hypomagnesemia    Stroke (HCC)   Paroxysmal atrial fibrillation (HCC)   Stroke CT head negative for acute bleed but possibly positive for stroke MRI ordered as well as CTA head and neck by neural hospitalist Aspirin full dose given  And daily A1c ordered Lipid panel ordered Admitted to telemetry bed Echo ordered Carotid Dopplers ordered Stroke team to follow patient When necessary hydralazine  Drug overdose Poison control was called We'll monitor on telemetry Serial EKGs QT is prolonged Will replace magnesium Potassium increase by EDP Sitter ordered Discharge Grant once medically cleared Tylenol &  aspirin level negative Patient was found down so checking CK Hold Ambien, Zanaflex, BuSpar, Chantix, lyrica Pt had access to  opioids so when necessary Narcan ordered  History of myxoma and atrial fibrillation Off chronic anticoagulation On telemetry  Depression-anxiety Holding Cymbalta hold BuSpar  LE edema Lasix 10 mg IV daily  RLS Hold Requip  Reflux Hold Prilosec  Hyperlipidemia Holding statin  Code Status: FC  DVT Prophylaxis: heparin Family Communication: son at bedside Disposition Plan: Pending Improvement  Status: tele inpt  Elwin Mocha, MD Family Medicine [facility] [email or web address] Password [email or web address]

## 2017-06-03 NOTE — Progress Notes (Addendum)
Pt had an MRI ordered, pt has a [employer] pacemaker.  Arrangements were made for [employer] Rep to be here and set the pacer to the correct settings and SWOT had asked [place] to call when we were ready to monitor patient.  I called the nurse around 1630 and she denied knowing anything about it (neither Retail buyer).  I called ER Charge Nurse and talked withher and was informed that the pt's ER nurse was taking a patient up to the ICU and there was nobody available to monitor patient during scan which is pacemaker protocol.  I called [facility] and they were willing to help but someone would have to relieve her if she got paged.  Unable to accommodate that need, pt was sent back to the ER to be admitted upstairs and will have to try again sometime later, most likely tomorrow.  [employer] set pt's device back to prior settings.

## 2017-06-04 ENCOUNTER — Encounter (HOSPITAL_COMMUNITY): Payer: Self-pay | Admitting: *Deleted

## 2017-06-04 ENCOUNTER — Other Ambulatory Visit (HOSPITAL_COMMUNITY): Payer: Medicare Other

## 2017-06-04 ENCOUNTER — Encounter (HOSPITAL_COMMUNITY): Payer: Medicare Other

## 2017-06-04 ENCOUNTER — Inpatient Hospital Stay (HOSPITAL_COMMUNITY): Payer: Medicare Other

## 2017-06-04 DIAGNOSIS — E785 Hyperlipidemia, unspecified: Secondary | ICD-10-CM

## 2017-06-04 DIAGNOSIS — T50901A Poisoning by unspecified drugs, medicaments and biological substances, accidental (unintentional), initial encounter: Secondary | ICD-10-CM

## 2017-06-04 DIAGNOSIS — I10 Essential (primary) hypertension: Secondary | ICD-10-CM

## 2017-06-04 DIAGNOSIS — I480 Paroxysmal atrial fibrillation: Secondary | ICD-10-CM

## 2017-06-04 DIAGNOSIS — R4182 Altered mental status, unspecified: Secondary | ICD-10-CM

## 2017-06-04 DIAGNOSIS — R569 Unspecified convulsions: Secondary | ICD-10-CM

## 2017-06-04 DIAGNOSIS — T450X1A Poisoning by antiallergic and antiemetic drugs, accidental (unintentional), initial encounter: Principal | ICD-10-CM

## 2017-06-04 LAB — URINE CULTURE: Culture: NO GROWTH

## 2017-06-04 LAB — CBC WITH DIFFERENTIAL/PLATELET
Basophils Absolute: 0 10*3/uL (ref 0.0–0.1)
Basophils Relative: 0 %
EOS PCT: 1 %
Eosinophils Absolute: 0.1 10*3/uL (ref 0.0–0.7)
HCT: 30.4 % — ABNORMAL LOW (ref 36.0–46.0)
Hemoglobin: 9.5 g/dL — ABNORMAL LOW (ref 12.0–15.0)
LYMPHS ABS: 1.8 10*3/uL (ref 0.7–4.0)
LYMPHS PCT: 28 %
MCH: 25.8 pg — AB (ref 26.0–34.0)
MCHC: 31.3 g/dL (ref 30.0–36.0)
MCV: 82.6 fL (ref 78.0–100.0)
MONO ABS: 0.6 10*3/uL (ref 0.1–1.0)
MONOS PCT: 9 %
Neutro Abs: 3.9 10*3/uL (ref 1.7–7.7)
Neutrophils Relative %: 62 %
PLATELETS: 284 10*3/uL (ref 150–400)
RBC: 3.68 MIL/uL — ABNORMAL LOW (ref 3.87–5.11)
RDW: 17.4 % — AB (ref 11.5–15.5)
WBC: 6.4 10*3/uL (ref 4.0–10.5)

## 2017-06-04 LAB — COMPREHENSIVE METABOLIC PANEL
ALBUMIN: 3.3 g/dL — AB (ref 3.5–5.0)
ALT: 20 U/L (ref 14–54)
ANION GAP: 9 (ref 5–15)
AST: 26 U/L (ref 15–41)
Alkaline Phosphatase: 65 U/L (ref 38–126)
BILIRUBIN TOTAL: 0.9 mg/dL (ref 0.3–1.2)
BUN: 8 mg/dL (ref 6–20)
CHLORIDE: 118 mmol/L — AB (ref 101–111)
CO2: 18 mmol/L — AB (ref 22–32)
Calcium: 8.6 mg/dL — ABNORMAL LOW (ref 8.9–10.3)
Creatinine, Ser: 1.03 mg/dL — ABNORMAL HIGH (ref 0.44–1.00)
GFR calc Af Amer: >60 mL/min (ref >60)
GFR calc non Af Amer: 56 mL/min — ABNORMAL LOW (ref >60)
GLUCOSE: 76 mg/dL (ref 65–99)
POTASSIUM: 3.6 mmol/L (ref 3.5–5.1)
SODIUM: 145 mmol/L (ref 135–145)
TOTAL PROTEIN: 6.4 g/dL — AB (ref 6.5–8.1)

## 2017-06-04 LAB — LIPID PANEL
CHOL/HDL RATIO: 3.2 ratio
CHOLESTEROL: 136 mg/dL (ref 0–200)
HDL: 42 mg/dL (ref >40)
LDL Cholesterol: 81 mg/dL (ref 0–99)
Triglycerides: 63 mg/dL (ref <150)
VLDL: 13 mg/dL (ref 0–40)

## 2017-06-04 LAB — PHOSPHORUS: Phosphorus: 3.4 mg/dL (ref 2.5–4.6)

## 2017-06-04 LAB — HIV ANTIBODY (ROUTINE TESTING W REFLEX): HIV SCREEN 4TH GENERATION: NONREACTIVE

## 2017-06-04 LAB — HEMOGLOBIN A1C
Hgb A1c MFr Bld: 5.6 % (ref 4.8–5.6)
MEAN PLASMA GLUCOSE: 114.02 mg/dL

## 2017-06-04 LAB — MAGNESIUM: Magnesium: 2 mg/dL (ref 1.7–2.4)

## 2017-06-04 MED ORDER — ATORVASTATIN CALCIUM 10 MG PO TABS: 20.0000 mg | ORAL_TABLET | Freq: Every day | ORAL | Status: DC
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
## 2017-06-04 NOTE — Progress Notes (Signed)
SLP Cancellation Note  Patient Details Name: Julia Schwartz MRN: [id number] DOB: 22-Apr-1951   Cancelled treatment:        planned for MBS today, pt and daughter reportedly leaving AMA when exam was planned. Will not be able to complete test now even if pt decided today stay as radiology schedule is otherwise full. If pt still admitted, will f/u tomorrow. OK to give PO meds as needed, but risk of aspiration with liquids.    Caidin Heidenreich, Katherene Ponto 06/04/2017, 11:57 AM

## 2017-06-04 NOTE — Evaluation (Signed)
Physical Therapy Evaluation Patient Details Name: Julia Schwartz MRN: [id number] DOB: 06/11/1951 Today's Date: 06/04/2017   History of Present Illness  66 y.o. female  past medical history of anxiety, depression, for myalgia, hepatitis C and transient ischemic attack who presents emergency room with being found down.  It is believed she overdosed on benadryl.  She had been depressed, it was 1 year anniversary of death of her son. CT showed possible acute vs subacute CVA L frontal and parietal lobes. Further stroke workup pending.  Clinical Impression  Pt admitted with above diagnosis. Pt currently with functional limitations due to the deficits listed below (see PT Problem List). Mod assist for bed to recliner transfer. Poor sitting and standing balance. At present, she requires 24* assistance.  Pt will benefit from skilled PT to increase their independence and safety with mobility to allow discharge to the venue listed below.       Follow Up Recommendations SNF;Supervision for mobility/OOB    Equipment Recommendations  Rolling walker with 5" wheels    Recommendations for Other Services       Precautions / Restrictions Precautions Precautions: Fall Precaution Comments: pt reports 3-4 falls in past 1 year Restrictions Weight Bearing Restrictions: No      Mobility  Bed Mobility Overal bed mobility: Needs Assistance Bed Mobility: Supine to Sit     Supine to sit: Mod assist     General bed mobility comments: Mod A to raise trunk  Transfers Overall transfer level: Needs assistance Equipment used: Rolling walker (2 wheeled) Transfers: Sit to/from [facility] Sit to Stand: Mod assist;+2 safety/equipment;+2 physical assistance Stand pivot transfers: Mod assist;+2 safety/equipment;+2 physical assistance       General transfer comment: mod A to rise/steady, increased time and mod A for balance to take pivotal steps to recliner  Ambulation/Gait                 Stairs            Wheelchair Mobility    Modified Rankin (Stroke Patients Only)       Balance Overall balance assessment: Needs assistance Sitting-balance support: Feet supported;Single extremity supported Sitting balance-Leahy Scale: Poor Sitting balance - Comments: posterior lean requiring mod A Postural control: Posterior lean Standing balance support: Bilateral upper extremity supported Standing balance-Leahy Scale: Poor Standing balance comment: requires BUE support                             Pertinent Vitals/Pain Pain Assessment: No/denies pain    Home Living Family/patient expects to be discharged to:: Private residence Living Arrangements: Children;Other relatives (brother and daughter) Available Help at Discharge: Family;Available 24 hours/day Type of Home: House Home Access: Level entry     Home Layout: One level Home Equipment: Cane - single point      Prior Function Level of Independence: Independent               Hand Dominance   Dominant Hand: Right    Extremity/Trunk Assessment   Upper Extremity Assessment Upper Extremity Assessment: Defer to OT evaluation    Lower Extremity Assessment Lower Extremity Assessment: Overall WFL for tasks assessed (reports L foot feels "tingly" with light touch)    Cervical / Trunk Assessment Cervical / Trunk Assessment: Normal  Communication   Communication: Expressive difficulties (garbled speech, pt cannot find her dentures which may improve her speech)  Cognition Arousal/Alertness: Awake/alert Behavior During Therapy: WFL for tasks assessed/performed Overall  Cognitive Status: No family/caregiver present to determine baseline cognitive functioning                                 General Comments: oriented to location, self, and month; she stated year is 2017      General Comments      Exercises     Assessment/Plan    PT Assessment Patient needs  continued PT services  PT Problem List Decreased activity tolerance;Decreased balance;Decreased mobility;Decreased knowledge of use of DME       PT Treatment Interventions DME instruction;Gait training;Functional mobility training;Therapeutic exercise;Therapeutic activities;Patient/family education    PT Goals (Current goals can be found in the Care Plan section)  Acute Rehab PT Goals Patient Stated Goal: to find her dentures; no mobility goals stated Time For Goal Achievement: 06/18/17 Potential to Achieve Goals: Good    Frequency Min 3X/week   Barriers to discharge        Co-evaluation               AM-PAC PT "6 Clicks" Daily Activity  Outcome Measure Difficulty turning over in bed (including adjusting bedclothes, sheets and blankets)?: A Lot Difficulty moving from lying on back to sitting on the side of the bed? : Unable Difficulty sitting down on and standing up from a chair with arms (e.g., wheelchair, bedside commode, etc,.)?: Unable Help needed moving to and from a bed to chair (including a wheelchair)?: A Lot Help needed walking in hospital room?: Total Help needed climbing 3-5 steps with a railing? : Total 6 Click Score: 8    End of Session Equipment Utilized During Treatment: Gait belt Activity Tolerance: Patient limited by fatigue Patient left: in chair;with call bell/phone within reach;with nursing/sitter in room (suicide sitter in room) Nurse Communication: Mobility status PT Visit Diagnosis: Unsteadiness on feet (R26.81);History of falling (Z91.81);Difficulty in walking, not elsewhere classified (R26.2)    Time: 0370-4888 PT Time Calculation (min) (ACUTE ONLY): 19 min   Charges:   PT Evaluation $PT Eval Moderate Complexity: 1 Mod     PT G Codes:          Philomena Doheny 06/04/2017, 9:20 AM [phone]

## 2017-06-04 NOTE — Progress Notes (Signed)
Patient is in the room with daughter at her bedside. Patient is alert and oriented X 4. PT was requesting "something for pain". Tylenol is offered per order. Patient states that she normally takes Norco at home and wants Hydrocodone. She is told that MD did not order Norco but we would get in touch with MD to ask. Patient's daughter got up and and asked "why cant she get Norco?. "is it coz she overdosed on benadryl?" "its just a over the counter, Im a CNA, I'll give it to her if you don't give her" [name] daughter started to raise her voice as she makes calls to someone to bring her mother's bag and the pain medicine with the bag. As nurse tried to calm her down, daughter states "Im gon bit someone ass". Development worker, international aid are notified. Nurse continues to talk to patient to calm her and her daughter down. Both daughter and patient continues to scream. MD is also notified. Surveyor, quantity came into the room to talk to patient and explain to her that MD is on the way and encouraged her to avoid taking any narcotics from home. Daughter continues to scream stating "I have the medication, Ill give it to her if you dont, I'm not afraid to go to jail, Im gon bit someone's ass"  Patient states she was not trying to kill herself that she just took too many "pills".   MD at bedside trying to talk to patient. Patient states "Im leaving". MD asked patient if she is suicidal, PT states "NO IM NOT SUICIDAL, IM LEAVING IF YOU WONT GIVE ME PAIN MEDICINE", MD asked patient again if she was thinking about harming herself, patient states "I JUST TOLD YOU- NO, YOU'RE ANNOYING ME". MD asked again if patient  she is suicidal, patient screamed "WHAT DID I JUST TELL YOU, IM NOT" as she pulls her IV, saying "TAKE THIS OFF ME, IM OUT OF HERE" Her daughter during this time is pacing around the room screaming with multiple choice words inserted that they want to leave, "IM NOT AFRAID TO GO TO JAIL, IM GON BIT A NURSE, MAN  OR WOMAN, I DONT CARE, YOU HAVE NO RIGHT TO KEEP [place]". [facility] security staff and [employer] are on standby. Patient is given AMA document to sign. Patient wrote "fuck you" on the signature line and left.

## 2017-06-04 NOTE — Evaluation (Signed)
Clinical/Bedside Swallow Evaluation Patient Details  Name: Julia Schwartz MRN: [id number] Date of Birth: 1951-03-27  Today's Date: 06/04/2017 Time: SLP Start Time (ACUTE ONLY): 1055 SLP Stop Time (ACUTE ONLY): 1110 SLP Time Calculation (min) (ACUTE ONLY): 15 min  Past Medical History:  Past Medical History:  Diagnosis Date  . Anxiety   . Arthritis   . 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 Echo 2/17:  Mild focal basal septal hypertrophy, EF 50-55%, no RWMA, Gr 1 DD, trivial MR, normal RVF, mild TR, pericardial patch in interatrial septum patent - no residual flow  . Chronic back pain   . Depression   . Fibromyalgia   . GERD (gastroesophageal reflux disease)   . Hepatitis    hep  c    dx 2007  . Hypertension   . Stroke [facility])    TIA came to er on 1/3  some deficits in right hand  . Wears dentures    top-partial bottom  . Wears glasses    Past Surgical History:  Past Surgical History:  Procedure Laterality Date  . ABDOMINAL HYSTERECTOMY    . BUNIONECTOMY     both feet  . CARDIAC CATHETERIZATION N/A 10/23/2015   Procedure: Left Heart Cath and Coronary Angiography;  Surgeon: Belva Crome, MD;  Location: [place] [facility];  Service: Cardiovascular;  Laterality: N/A;  . EP IMPLANTABLE DEVICE N/A 11/14/2015   Procedure: Pacemaker Implant;  Surgeon: Deboraha Sprang, MD;  Location: [place] [facility];  Service: Cardiovascular;  Laterality: N/A;  . EXCISION OF ATRIAL MYXOMA N/A 11/04/2015   Procedure: RESECTION OF LEFT ATRIAL MYXOMA, BI-ATRIAL APPROACH WITH PERICARDIAL PATCH;  Surgeon: Ivin Poot, MD;  Location: [place];  Service: Open Heart Surgery;  Laterality: N/A;  . FRACTURE SURGERY     fx lt ankle  . SUBMANDIBULAR GLAND EXCISION     left  . TEE WITHOUT CARDIOVERSION N/A 10/18/2015   Procedure: TRANSESOPHAGEAL ECHOCARDIOGRAM (TEE);  Surgeon: Cornelious Spark, MD;  Location: [place];  Service: Cardiovascular;  Laterality: N/A;  . TEE WITHOUT CARDIOVERSION N/A  11/04/2015   Procedure: TRANSESOPHAGEAL ECHOCARDIOGRAM (TEE);  Surgeon: Ivin Poot, MD;  Location: [place];  Service: Open Heart Surgery;  Laterality: N/A;  . TONSILLECTOMY     HPI:  Julia Hendricksis an 66 y.o.femalewho was found this AM by her husband on the floor with multiple pink pills around her and pink dust on her mouth. EMS was called and patient was found severely encephalopathic, moaning and following no commands. Pills appeared to be benadryl. 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 She was off her AC. CT head shows Cortical and subcortical low-density in the posterior left frontal lobe and anterior left parietal lobe concerning for acute to subacute infarction. 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    Assessment / Plan / Recommendation Clinical Impression  Pt presents with concern for CN X impairment given abnormal resonance with speech and consistent delayed coughing after sips of water and throat clearing with bites of puree. Will proceed with objective testing to determine nature of impairment if any. 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  SLP Visit Diagnosis: Dysphagia, oropharyngeal phase (R13.12)    Aspiration Risk  Moderate aspiration risk    Diet Recommendation NPO except meds   Supervision: Patient able to self feed Postural  Changes: Seated upright at 90 degrees    Other  Recommendations     Follow up Recommendations        Frequency and Duration            Prognosis        Swallow Study   General HPI: Julia Hendricksis an 66 y.o.femalewho was found this AM by her husband on the floor with multiple pink pills around her and pink dust on her mouth. EMS was called and patient was found severely encephalopathic, moaning and following no commands. Pills appeared to be benadryl. She has a history of atrial  myxoma and has a Pacemaker for brady-tachy syndrome. She was off her AC. CT head shows Cortical and subcortical low-density in the posterior left frontal lobe and anterior left parietal lobe concerning for acute to subacute infarction. 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  Type of Study: Bedside Swallow Evaluation Previous Swallow Assessment: none Diet Prior to this Study: NPO Temperature Spikes Noted: No Respiratory Status: Room air History of Recent Intubation: No Behavior/Cognition: Alert Oral Cavity Assessment: Within Functional Limits Oral Care Completed by SLP: No Oral Cavity - Dentition: Edentulous;Dentures, not available Vision: Functional for self-feeding Self-Feeding Abilities: Able to feed self Patient Positioning: Upright in bed Baseline Vocal Quality: Normal Volitional Cough: Strong Volitional Swallow: Able to elicit    Oral/Motor/Sensory Function Overall Oral Motor/Sensory Function: Mild impairment Facial ROM: Within Functional Limits Facial Symmetry: Within Functional Limits Facial Strength: Within Functional Limits Facial Sensation: Within Functional Limits Lingual ROM: Within Functional Limits Lingual Symmetry: Within Functional Limits Lingual Strength: Within Functional Limits Lingual Sensation: Within Functional Limits Velum: Suspected CN X (Vagus) dysfunction;Other (comment) (hypernasal speech)   Ice Chips Ice chips: Not tested   Thin Liquid Thin Liquid: Impaired Presentation: Cup;Straw;Self Fed Pharyngeal  Phase Impairments: Cough - Delayed    Nectar Thick Nectar Thick Liquid: Not tested   Honey Thick Honey Thick Liquid: Not tested   Puree Puree: Impaired Presentation: Spoon Pharyngeal Phase Impairments: Throat Clearing - Immediate   Solid   GO   Solid: Not tested       Herbie Baltimore, MA CCC-SLP [phone]  Farooq Petrovich, Katherene Ponto 06/04/2017,11:22 AM
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
## 2017-06-08 ENCOUNTER — Ambulatory Visit: Payer: Medicare Other | Admitting: Neurology

## 2017-06-08 LAB — CULTURE, BLOOD (ROUTINE X 2)
CULTURE: NO GROWTH
CULTURE: NO GROWTH
Special Requests: ADEQUATE
Special Requests: ADEQUATE

## 2017-09-27 IMAGING — DX DG CHEST 2V
2 series · 2 of 2 positions shown · non-contrast
Comparison: 12/04/2015

CLINICAL DATA: Altered mental status

EXAM:
CHEST  2 VIEW

[chest lat]
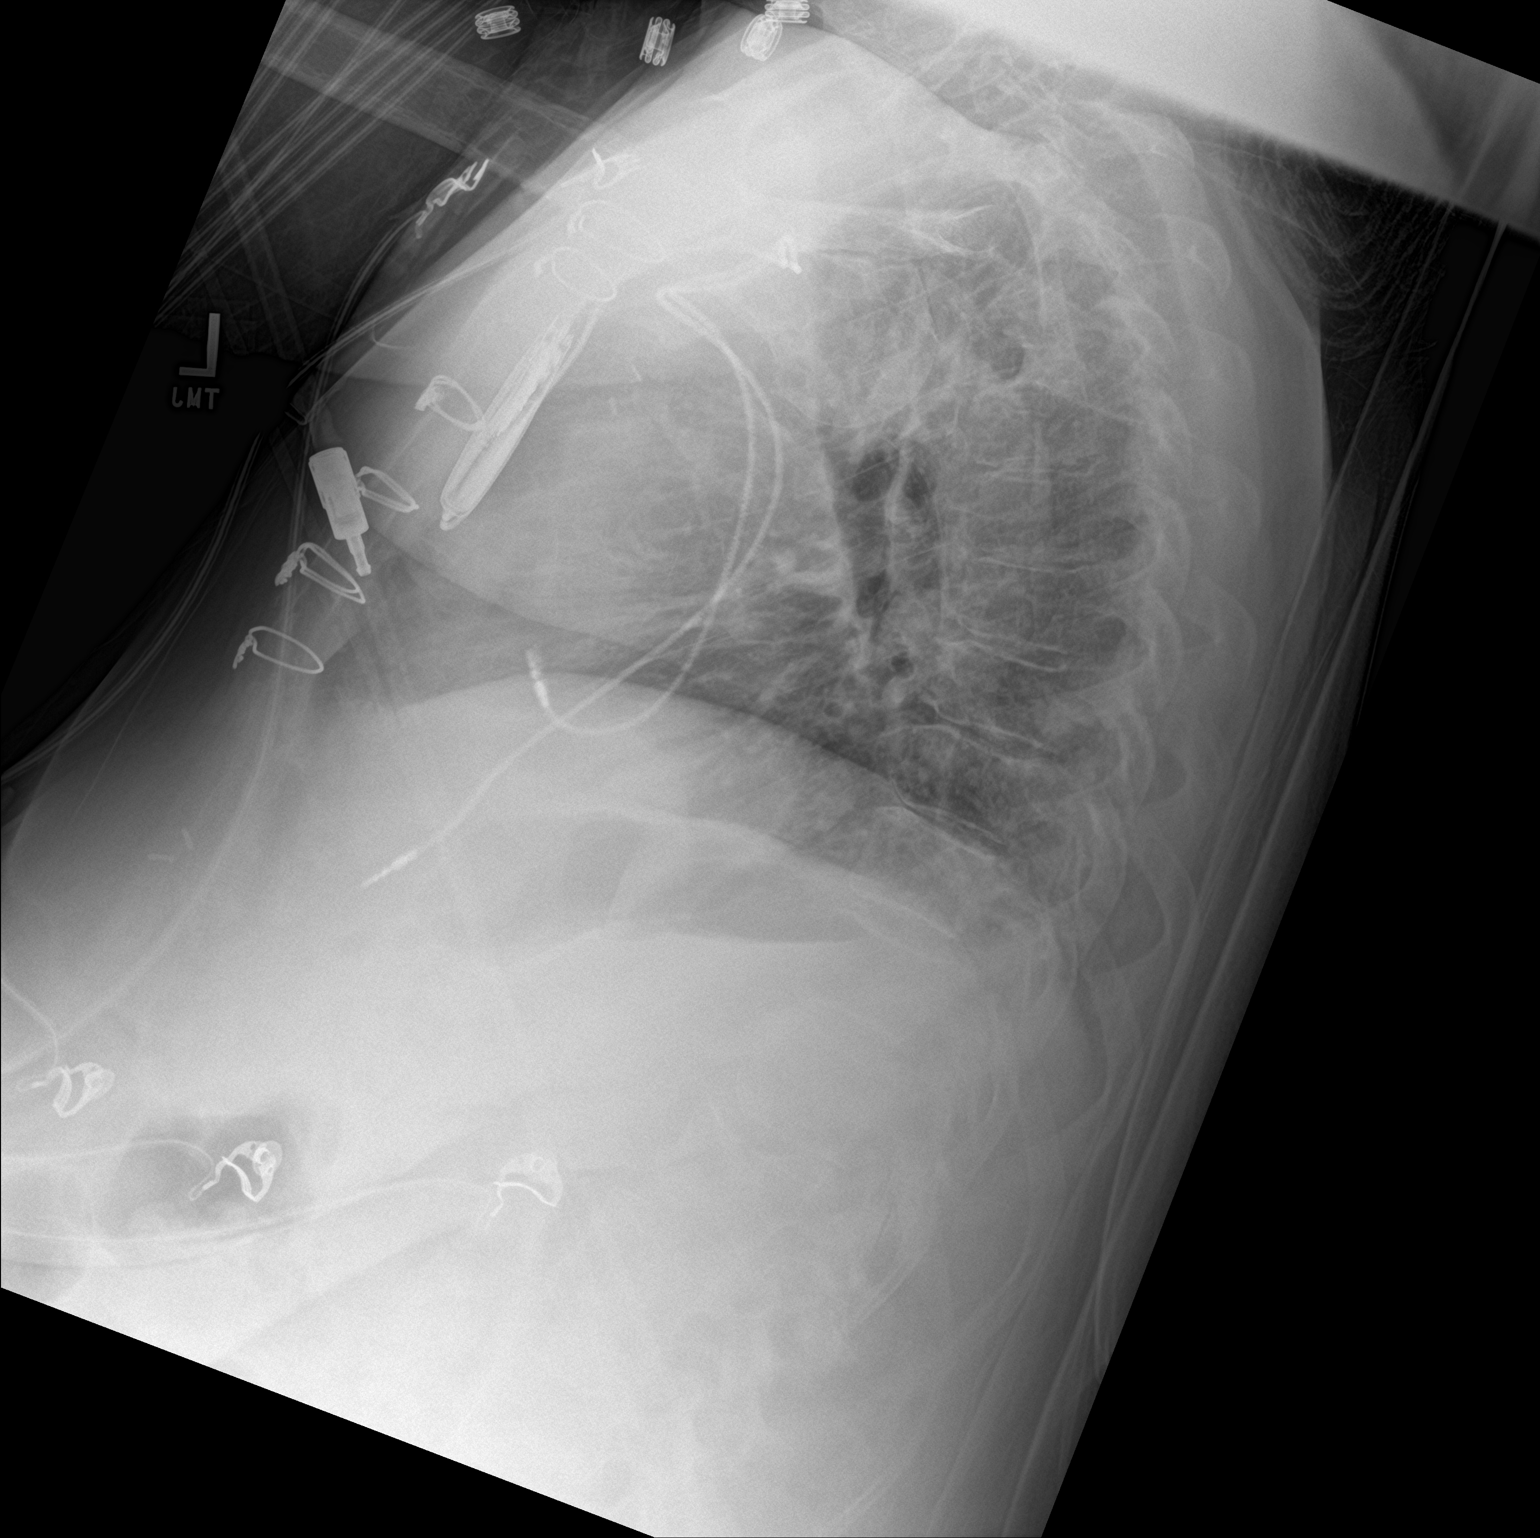

[chest ap]
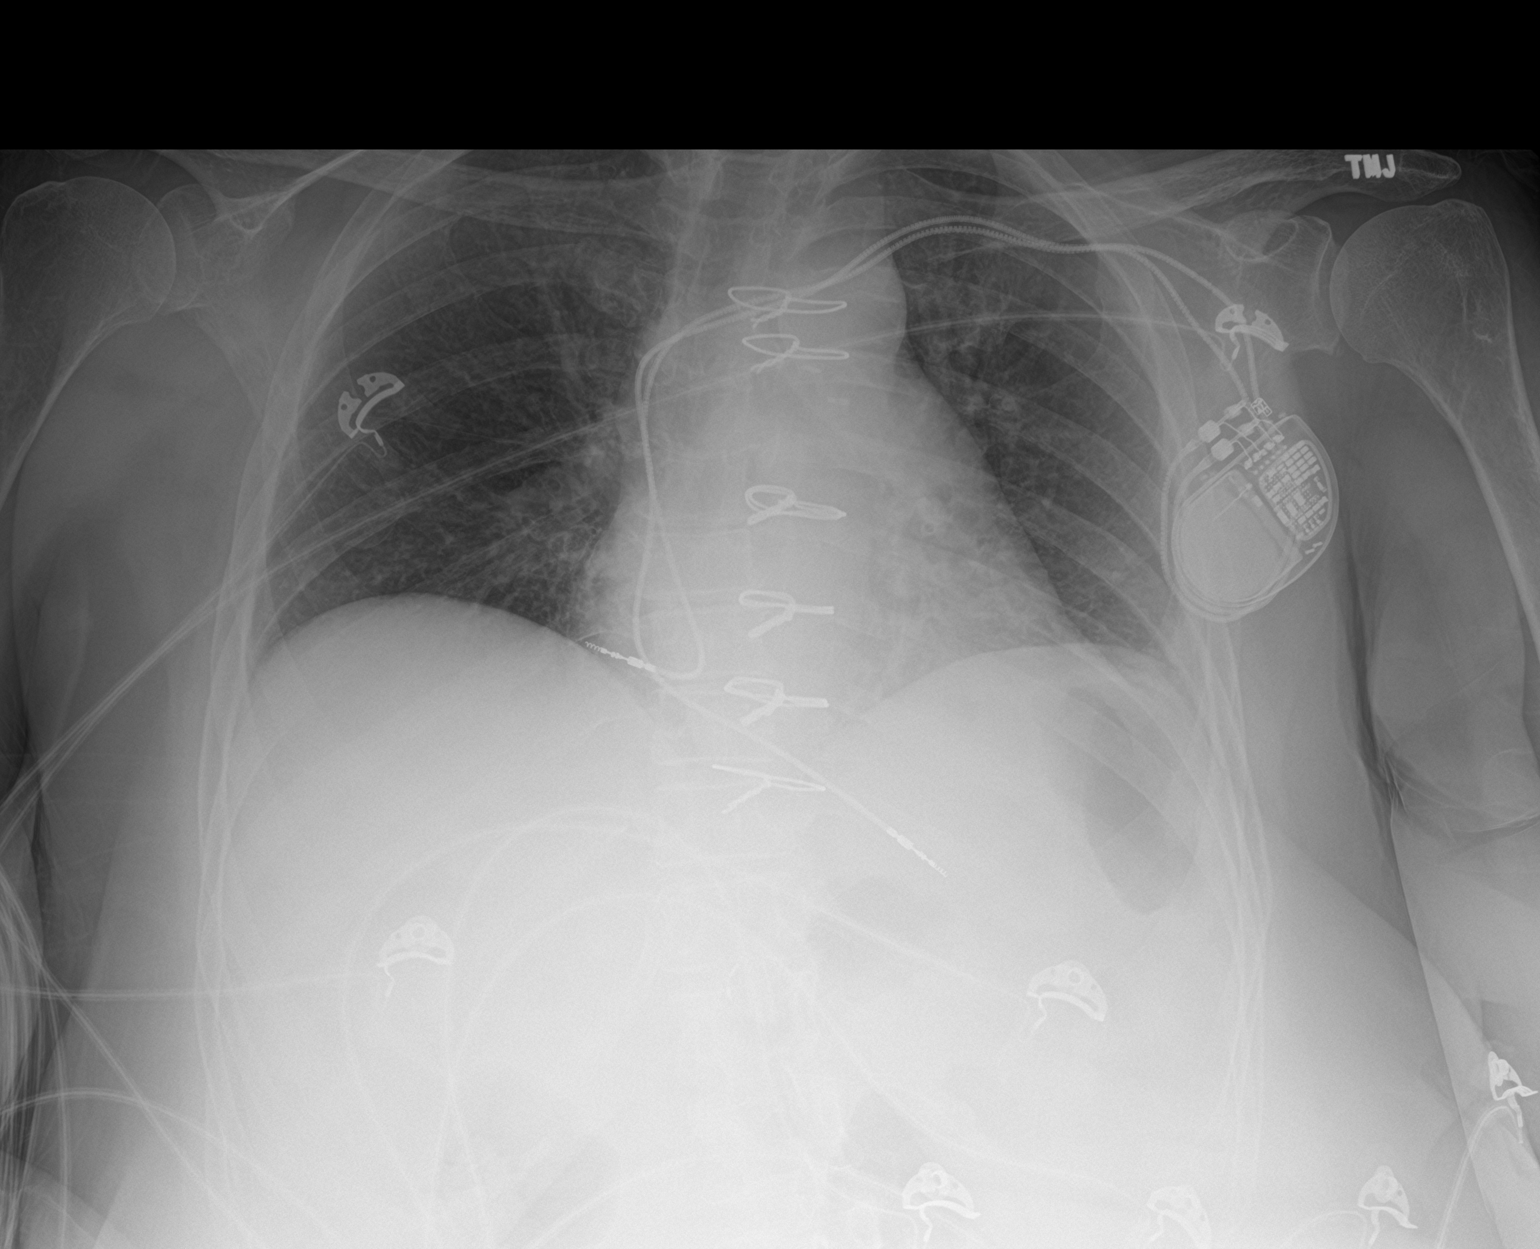

[2 of 2 positions shown; findings below may reference images not displayed]

FINDINGS: Pacer remains in place, unchanged. Prior median sternotomy. Heart
and mediastinal contours are within normal limits. No focal
opacities or effusions. No acute bony abnormality.
IMPRESSION: No active cardiopulmonary disease.

## 2018-04-29 ENCOUNTER — Encounter: Payer: Self-pay | Admitting: Cardiology

## 2021-04-17 ENCOUNTER — Other Ambulatory Visit: Payer: Self-pay

## 2021-04-17 ENCOUNTER — Emergency Department (HOSPITAL_COMMUNITY): Payer: Medicare Other

## 2021-04-17 ENCOUNTER — Emergency Department (HOSPITAL_COMMUNITY)
Admission: EM | Admit: 2021-04-17 | Discharge: 2021-04-17 | Disposition: A | Discharge status: Home / Self Care | Payer: Medicare Other | Attending: Emergency Medicine | Admitting: Emergency Medicine

## 2021-04-17 DIAGNOSIS — I10 Essential (primary) hypertension: Secondary | ICD-10-CM | POA: Diagnosis not present

## 2021-04-17 DIAGNOSIS — D151 Benign neoplasm of heart: Secondary | ICD-10-CM | POA: Insufficient documentation

## 2021-04-17 DIAGNOSIS — R0789 Other chest pain: Secondary | ICD-10-CM | POA: Diagnosis not present

## 2021-04-17 DIAGNOSIS — Z79899 Other long term (current) drug therapy: Secondary | ICD-10-CM | POA: Insufficient documentation

## 2021-04-17 DIAGNOSIS — F17210 Nicotine dependence, cigarettes, uncomplicated: Secondary | ICD-10-CM | POA: Insufficient documentation

## 2021-04-17 DIAGNOSIS — Z7982 Long term (current) use of aspirin: Secondary | ICD-10-CM | POA: Diagnosis not present

## 2021-04-17 LAB — BASIC METABOLIC PANEL
Anion gap: 7 (ref 5–15)
BUN: 12 mg/dL (ref 8–23)
CO2: 21 mmol/L — ABNORMAL LOW (ref 22–32)
Calcium: 8.8 mg/dL — ABNORMAL LOW (ref 8.9–10.3)
Chloride: 108 mmol/L (ref 98–111)
Creatinine, Ser: 1.04 mg/dL — ABNORMAL HIGH (ref 0.44–1.00)
GFR, Estimated: 58 mL/min — ABNORMAL LOW (ref >60)
Glucose, Bld: 98 mg/dL (ref 70–99)
Potassium: 3.9 mmol/L (ref 3.5–5.1)
Sodium: 136 mmol/L (ref 135–145)

## 2021-04-17 LAB — CBC
HCT: 35.9 % — ABNORMAL LOW (ref 36.0–46.0)
Hemoglobin: 11.2 g/dL — ABNORMAL LOW (ref 12.0–15.0)
MCH: 29.4 pg (ref 26.0–34.0)
MCHC: 31.2 g/dL (ref 30.0–36.0)
MCV: 94.2 fL (ref 80.0–100.0)
Platelets: 238 10*3/uL (ref 150–400)
RBC: 3.81 MIL/uL — ABNORMAL LOW (ref 3.87–5.11)
RDW: 13.1 % (ref 11.5–15.5)
WBC: 5 10*3/uL (ref 4.0–10.5)
nRBC: 0 % (ref 0.0–0.2)

## 2021-04-17 LAB — TROPONIN I (HIGH SENSITIVITY): Troponin I (High Sensitivity): 5 ng/L (ref <18)

## 2021-04-17 MED ORDER — DICLOFENAC SODIUM 1 % EX GEL: 4.0000 g | Freq: Four times a day (QID) | CUTANEOUS | 0 refills | Status: DC

## 2021-04-17 NOTE — Discharge Instructions (Addendum)
Use the gel as prescribed.  Please discuss with your family doctor if you are allowed to use Tylenol.  Please return for exertional symptoms.

## 2021-04-17 NOTE — ED Notes (Signed)
Sling was placed by ortho tech. Patient was assisted with getting dressed and then into WC. This RN wheeled patient outside of ED to await granddaughter. Patient is A/O at time of DC.

## 2021-04-17 NOTE — Progress Notes (Signed)
Orthopedic Tech Progress Note Patient Details:  Julia Schwartz 1951/07/13 [id number]  Ortho Devices Type of Ortho Device: Arm sling Ortho Device/Splint Location: LUE Ortho Device/Splint Interventions: Ordered, Application, Adjustment   Post Interventions Patient Tolerated: Well Instructions Provided: Adjustment of device, Care of device, Poper ambulation with device  Maclean Foister 04/17/2021, 9:14 PM

## 2021-04-17 NOTE — ED Provider Notes (Signed)
[facility] EMERGENCY DEPARTMENT Provider Note   CSN: [id number] Arrival date & time: 04/17/21  1628     History Chief Complaint  Patient presents with   Chest Pain    Julia Schwartz is a 70 y.o. female.  70 yo F with a chief complaints of chest pain.  This is left-sided and sharp.  Worse when she moves her left arm.  She feels like maybe an arm sling would make it better.  Denies any trauma to the shoulder denies cough congestion or fever.  She feels like her pacemaker may be has shifted to the left.  The history is provided by the patient.  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 Echo 2/17:  Mild focal basal septal hypertrophy, EF 50-55%, no RWMA, Gr 1 DD, trivial MR, normal RVF, mild TR, pericardial patch in interatrial septum patent - no residual flow   Chronic back pain    Depression    Fibromyalgia    GERD (gastroesophageal reflux disease)    Hepatitis    hep  c    dx 2007   Hypertension    Stroke [facility])    TIA came to er on 1/3  some deficits in right hand   Wears dentures    top-partial bottom   Wears glasses     Patient Active Problem List   Diagnosis Date Noted   Drug overdose 06/03/2017   Elevated LFTs 09/04/2016   Elevated serum creatinine 09/04/2016   Chronic anticoagulation 04/28/2016   Paroxysmal atrial fibrillation ([place]) 04/28/2016   Tension headache 04/28/2016   Encounter  for long-term (current) use of high-risk medication 01/10/2016   Cerebrovascular accident (CVA) due to embolism of cerebral artery ([place]) 01/08/2016   PAF (paroxysmal atrial fibrillation) ([place]) 11/21/2015   Atrial myxoma 11/04/2015   Stroke ([place])    Tobacco abuse    Fibromyalgia    Hypomagnesemia    Hypokalemia    Myxoma of heart    Hyperlipidemia    Tobacco use disorder    Embolic stroke ([place]) 13/05/6577   HTN (hypertension) 10/17/2015    Past Surgical History:  Procedure Laterality Date   ABDOMINAL HYSTERECTOMY     BUNIONECTOMY     both feet   CARDIAC CATHETERIZATION N/A 10/23/2015   Procedure: Left Heart Cath and Coronary Angiography;  Surgeon: Belva Crome, MD;  Location: [place] CV LAB;  Service: Cardiovascular;  Laterality: N/A;   EP IMPLANTABLE DEVICE N/A 11/14/2015   Procedure: Pacemaker Implant;  Surgeon: Deboraha Sprang, MD;  Location: [place] [facility];  Service: Cardiovascular;  Laterality: N/A;   EXCISION OF ATRIAL MYXOMA N/A 11/04/2015   Procedure: RESECTION OF LEFT ATRIAL MYXOMA, BI-ATRIAL APPROACH WITH PERICARDIAL PATCH;  Surgeon: Ivin Poot, MD;  Location: [place];  Service: Open Heart Surgery;  Laterality: N/A;   FRACTURE SURGERY     fx  lt ankle   SUBMANDIBULAR GLAND EXCISION     left   TEE WITHOUT CARDIOVERSION N/A 10/18/2015   Procedure: TRANSESOPHAGEAL ECHOCARDIOGRAM (TEE);  Surgeon: Rosielee Spark, MD;  Location: [place];  Service: Cardiovascular;  Laterality: N/A;   TEE WITHOUT CARDIOVERSION N/A 11/04/2015   Procedure: TRANSESOPHAGEAL ECHOCARDIOGRAM (TEE);  Surgeon: Ivin Poot, MD;  Location: [place];  Service: Open Heart Surgery;  Laterality: N/A;   TONSILLECTOMY       OB History   No obstetric history on file.     Family History  Problem Relation Age of Onset   COPD Mother    Diabetes Maternal Grandmother     Social History   Tobacco Use   Smoking status: Every Day    Packs/day: 0.25    Years: 10.00    Pack years: 2.50     Types: Cigarettes   Smokeless tobacco: Former   Tobacco comments:    smokes 1 to 2 trying to quit  Substance Use Topics   Alcohol use: No   Drug use: No    Home Medications Prior to Admission medications   Medication Sig Start Date End Date Taking? Authorizing Provider  diclofenac Sodium (VOLTAREN) 1 % GEL Apply 4 g topically 4 (four) times daily. 04/17/21  Yes Deno Etienne, DO  amLODipine-benazepril (LOTREL) 10-40 MG capsule Take 1 capsule by mouth daily.    [provider]  aspirin EC 81 MG EC tablet Take 1 tablet (81 mg total) by mouth daily. 11/09/15   Nani Skillern, PA-C  atorvastatin (LIPITOR) 20 MG tablet Take 20 mg by mouth daily.    [provider]  busPIRone (BUSPAR) 15 MG tablet Take 15 mg by mouth 3 (three) times daily.     [provider]  Cholecalciferol (VITAMIN D3) 2000 units capsule Take 2,000 Units by mouth daily.    [provider]  cyclobenzaprine (FLEXERIL) 10 MG tablet Take 10 mg by mouth 3 (three) times daily as needed for muscle spasms.    [provider]  diclofenac (VOLTAREN) 75 MG EC tablet Take 75 mg by mouth 2 (two) times daily.    [provider]  DULoxetine (CYMBALTA) 60 MG capsule Take 60 mg by mouth daily.    [provider]  furosemide (LASIX) 20 MG tablet Take 20 mg by mouth daily.    [provider]  metoprolol tartrate (LOPRESSOR) 25 MG tablet Take 0.5 tablets (12.5 mg total) by mouth 2 (two) times daily. 09/03/16 12/18/16  Hazelynn Spark, MD  naloxegol oxalate (MOVANTIK) 12.5 MG TABS tablet Take 25 mg by mouth daily.    [provider]  omeprazole (PRILOSEC) 40 MG capsule Take 1 capsule (40 mg total) by mouth daily. 10/24/15   Ghimire, Henreitta Leber, MD  pregabalin (LYRICA) 75 MG capsule Take 75 mg by mouth 2 (two) times daily.     [provider]  rOPINIRole (REQUIP) 0.25 MG tablet Take 0.25 mg by mouth 2 (two) times daily.    [provider]  SYMBICORT  160-4.5 MCG/ACT inhaler Inhale 2 puffs into the lungs as needed.  12/02/15   [provider]  varenicline (CHANTIX) 1 MG tablet 1 mg 2 (two) times daily.    [provider]    Allergies    Ultram [tramadol]  Review of Systems   Review of Systems  Constitutional:  Negative for chills and fever.  HENT:  Negative for congestion and rhinorrhea.   Eyes:  Negative for redness and  visual disturbance.  Respiratory:  Negative for shortness of breath and wheezing.   Cardiovascular:  Positive for chest pain. Negative for palpitations.  Gastrointestinal:  Negative for nausea and vomiting.  Genitourinary:  Negative for dysuria and urgency.  Musculoskeletal:  Positive for arthralgias. Negative for myalgias.  Skin:  Negative for pallor and wound.  Neurological:  Negative for dizziness and headaches.   Physical Exam Updated Vital Signs BP (!) 134/95   Pulse 84   Temp 98.8 F (37.1 C) (Oral)   Resp 18   SpO2 97%   Physical Exam Vitals and nursing note reviewed.  Constitutional:      General: She is not in acute distress.    Appearance: She is well-developed. She is not diaphoretic.  HENT:     Head: Normocephalic and atraumatic.  Eyes:     Pupils: Pupils are equal, round, and reactive to light.  Cardiovascular:     Rate and Rhythm: Normal rate and regular rhythm.     Heart sounds: No murmur heard.   No friction rub. No gallop.  Pulmonary:     Effort: Pulmonary effort is normal.     Breath sounds: No wheezing or rales.  Chest:     Chest wall: Tenderness present.     Comments: Pain with palpation of the left anterior chest wall.  Pain with range of motion of the left shoulder. Abdominal:     General: There is no distension.     Palpations: Abdomen is soft.     Tenderness: There is no abdominal tenderness.  Musculoskeletal:        General: No tenderness.     Cervical back: Normal range of motion and neck supple.  Skin:    General: Skin is warm and dry.   Neurological:     Mental Status: She is alert and oriented to person, place, and time.  Psychiatric:        Behavior: Behavior normal.    ED Results / Procedures / Treatments   Labs (all labs ordered are listed, but only abnormal results are displayed) Labs Reviewed  BASIC METABOLIC PANEL - Abnormal; Notable for the following components:      Result Value   CO2 21 (*)    Creatinine, Ser 1.04 (*)    Calcium 8.8 (*)    GFR, Estimated 58 (*)    All other components within normal limits  CBC - Abnormal; Notable for the following components:   RBC 3.81 (*)    Hemoglobin 11.2 (*)    HCT 35.9 (*)    All other components within normal limits  TROPONIN I (HIGH SENSITIVITY)  TROPONIN I (HIGH SENSITIVITY)    EKG EKG Interpretation  Date/Time:  Thursday April 17 2021 17:24:17 EDT Ventricular Rate:  89 PR Interval:  100 QRS Duration: 96 QT Interval:  414 QTC Calculation: 503 R Axis:   23 Text Interpretation: Sinus rhythm with sinus arrhythmia with short PR Minimal voltage criteria for LVH, may be normal variant ( R in aVL ) Inferior infarct , age undetermined Abnormal ECG No significant change since last tracing Confirmed by Deno Etienne [phone]) on 04/17/2021 6:51:18 PM  Radiology DG Chest 2 View  Result Date: 04/17/2021 CLINICAL DATA:  Chest pain for 2-3 days. EXAM: CHEST - 2 VIEW COMPARISON:  Chest radiograph dated 06/03/2017. FINDINGS: The heart size and mediastinal contours are within normal limits. There is mild left basilar atelectasis. The right lung is clear. There is no pleural effusion or pneumothorax on either side.  Degenerative changes are seen in the spine. A left subclavian approach cardiac device is noted. Median sternotomy wires are seen. IMPRESSION: Mild left basilar atelectasis. Electronically Signed   By: Zerita Boers M.D.   On: 04/17/2021 18:20    Procedures Procedures   Medications Ordered in ED Medications - No data to display  ED Course  I have reviewed the  triage vital signs and the nursing notes.  Pertinent labs & imaging results that were available during my care of the patient were reviewed by me and considered in my medical decision making (see chart for details).    MDM Rules/Calculators/A&P                          70 yo F with a cc of left chest pain.  Atypical in nature and reproduced on exam.  Trop negative, ecg with concerning findings.  CXR viewed by me without fx, ptx, pna.   D/c home.  PCP follow up.   9:16 PM:  I have discussed the diagnosis/risks/treatment options with the patient and believe the pt to be eligible for discharge home to follow-up with PCP. We also discussed returning to the ED immediately if new or worsening sx occur. We discussed the sx which are most concerning (e.g., sudden worsening pain, fever, inability to tolerate by mouth) that necessitate immediate return. Medications administered to the patient during their visit and any new prescriptions provided to the patient are listed below.  Medications given during this visit Medications - No data to display   The patient appears reasonably screen and/or stabilized for discharge and I doubt any other medical condition or other [facility] requiring further screening, evaluation, or treatment in the ED at this time prior to discharge.   Final Clinical Impression(s) / ED Diagnoses Final diagnoses:  Atypical chest pain    Rx / DC Orders ED Discharge Orders          Ordered    diclofenac Sodium (VOLTAREN) 1 % GEL  4 times daily        04/17/21 1941             Deno Etienne, DO 04/17/21 2116

## 2021-04-17 NOTE — ED Triage Notes (Signed)
Pt c/o CP x2-3 days, pacemaker in place after "open heart sx." Sharp pain that comes & goes, lasts for approx 78min 8/10 at worst, sometimes better w rest. Taken hydrocodone for pain, states it's not helped.

## 2021-05-15 ENCOUNTER — Other Ambulatory Visit: Payer: Self-pay | Admitting: Nurse Practitioner

## 2021-05-15 DIAGNOSIS — Z1231 Encounter for screening mammogram for malignant neoplasm of breast: Secondary | ICD-10-CM

## 2021-08-11 IMAGING — DX DG CHEST 2V
2 series · 2 of 2 positions shown · non-contrast
Comparison: Chest radiograph dated 06/03/2017.

CLINICAL DATA: Chest pain for 2-3 days.

EXAM:
CHEST - 2 VIEW

[w chest pa]
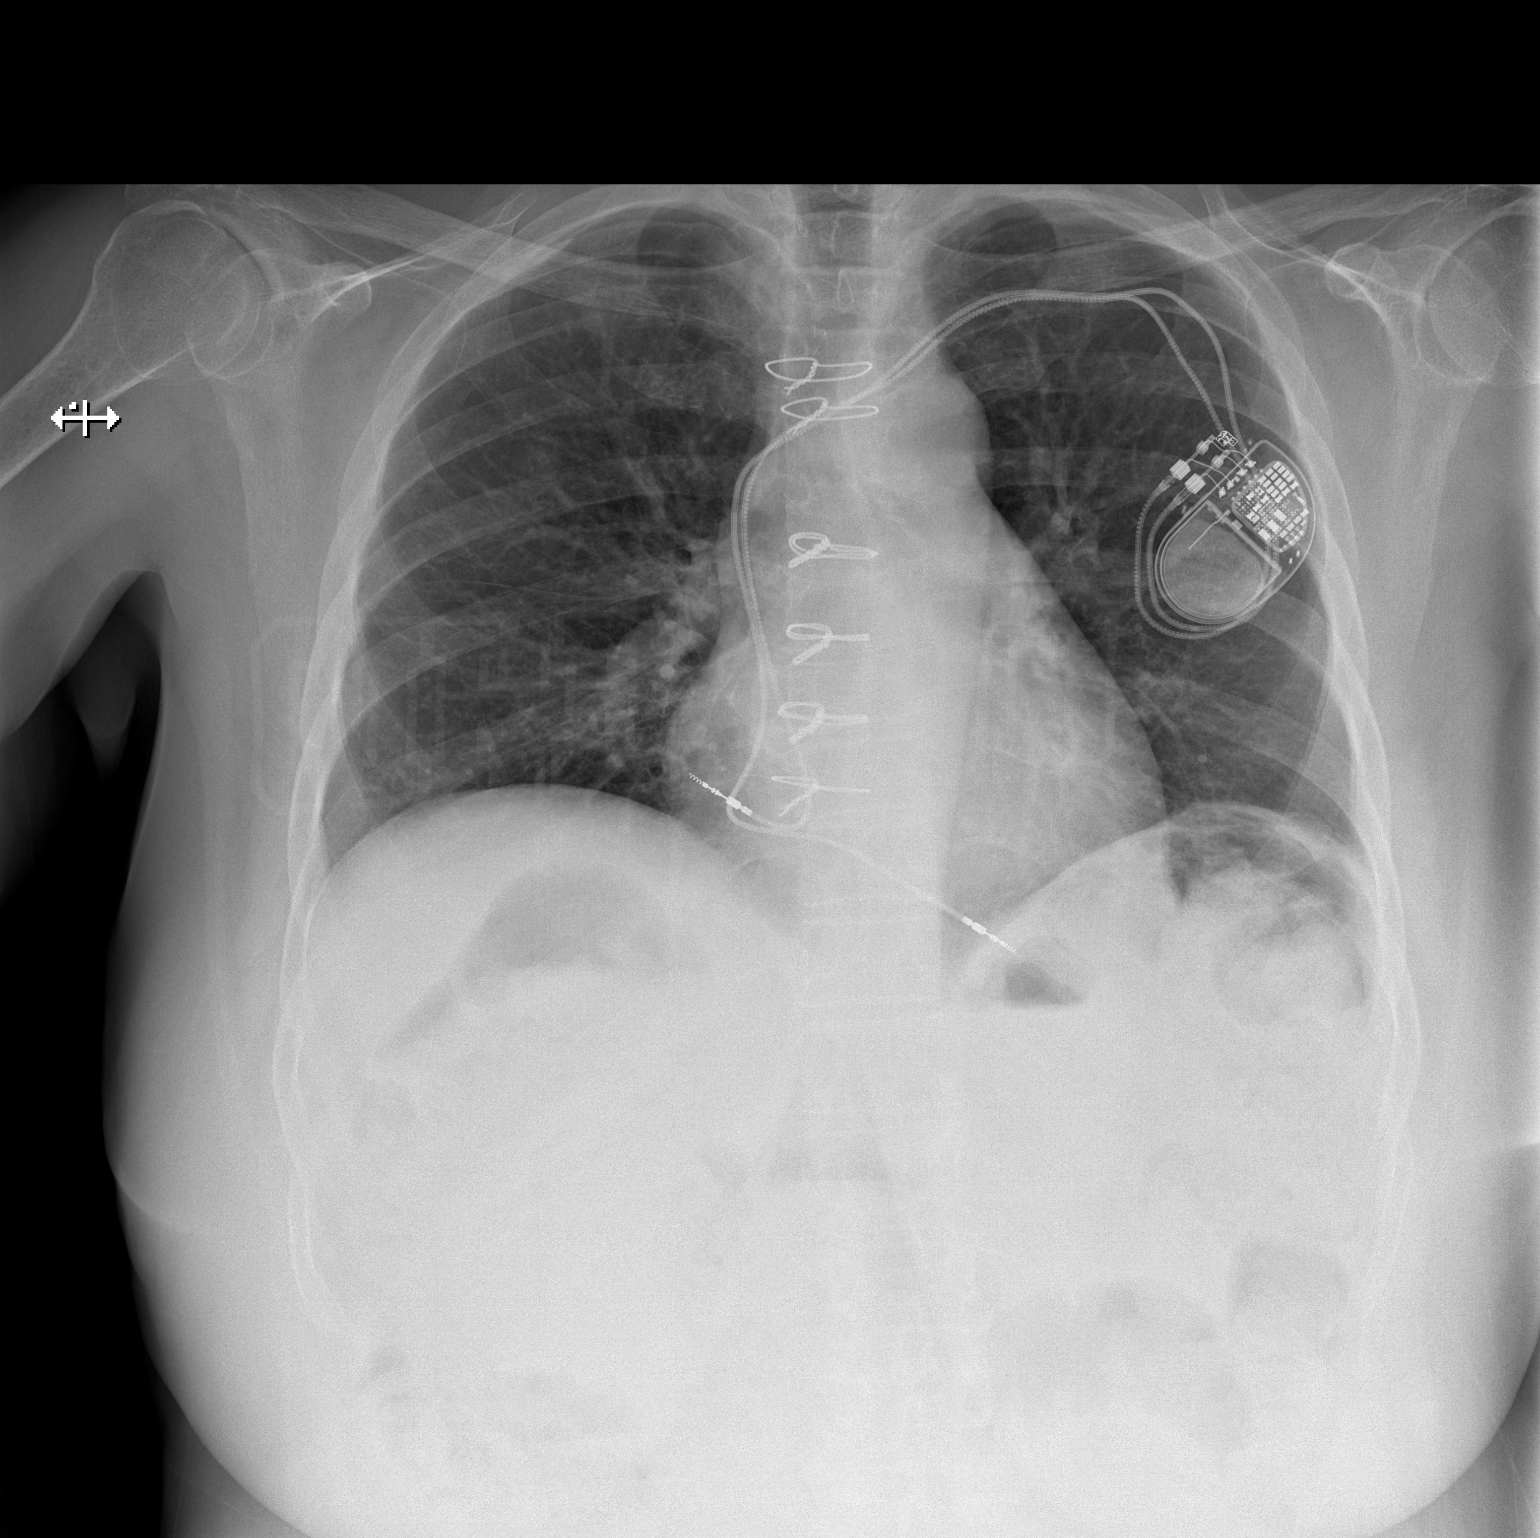

[w chest lat]
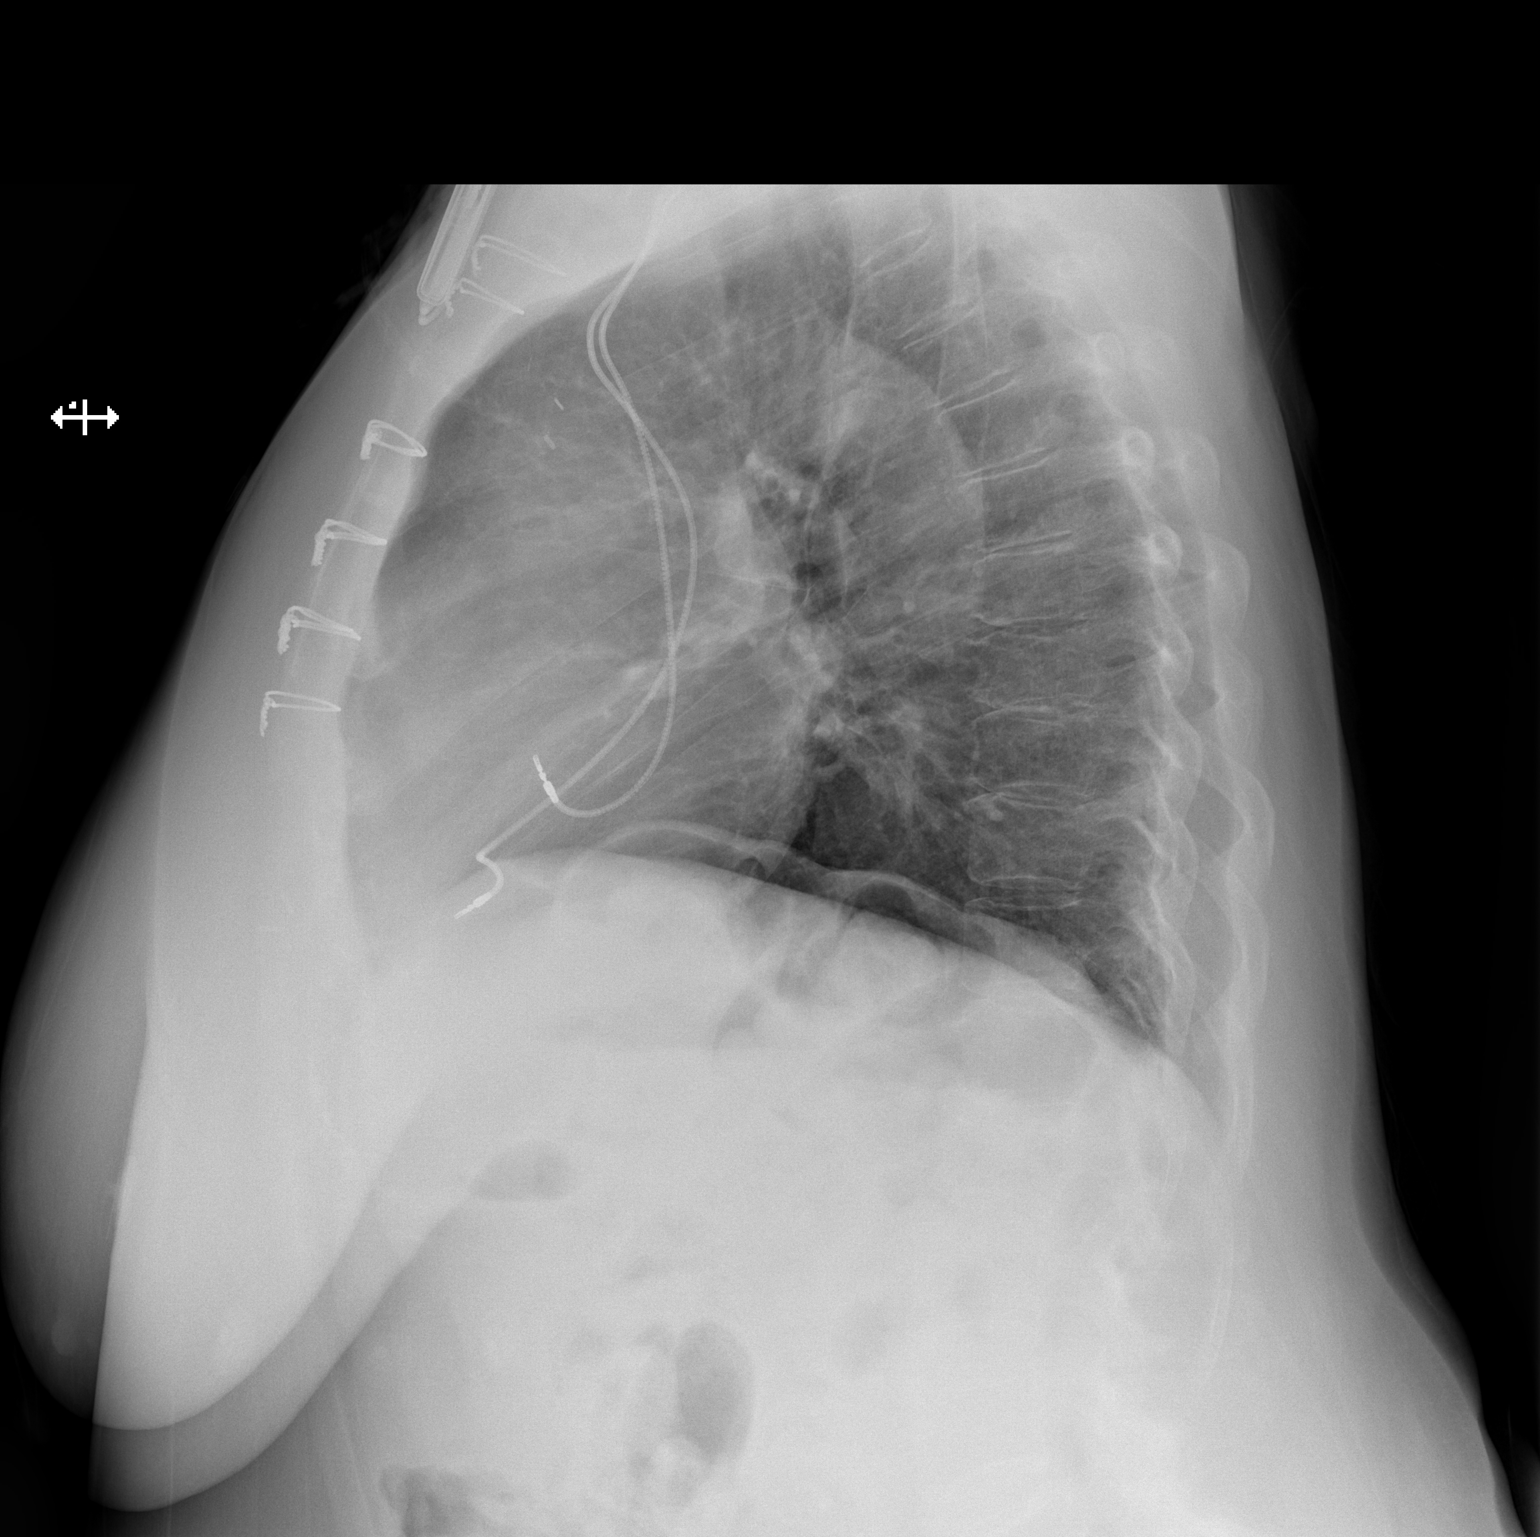

[2 of 2 positions shown; findings below may reference images not displayed]

FINDINGS: The heart size and mediastinal contours are within normal limits.
There is mild left basilar atelectasis. The right lung is clear.
There is no pleural effusion or pneumothorax on either side.
Degenerative changes are seen in the spine. A left subclavian
approach cardiac device is noted. Median sternotomy wires are seen.
IMPRESSION: Mild left basilar atelectasis.

## 2022-07-07 DIAGNOSIS — I509 Heart failure, unspecified: Secondary | ICD-10-CM | POA: Diagnosis not present

## 2022-07-07 DIAGNOSIS — F32A Depression, unspecified: Secondary | ICD-10-CM | POA: Diagnosis not present

## 2022-07-07 DIAGNOSIS — Z79899 Other long term (current) drug therapy: Secondary | ICD-10-CM | POA: Diagnosis not present

## 2022-07-07 DIAGNOSIS — I495 Sick sinus syndrome: Secondary | ICD-10-CM | POA: Diagnosis not present

## 2022-07-07 DIAGNOSIS — Z1159 Encounter for screening for other viral diseases: Secondary | ICD-10-CM | POA: Diagnosis not present

## 2022-07-07 DIAGNOSIS — Z23 Encounter for immunization: Secondary | ICD-10-CM | POA: Diagnosis not present

## 2022-07-07 DIAGNOSIS — M5136 Other intervertebral disc degeneration, lumbar region: Secondary | ICD-10-CM | POA: Diagnosis not present

## 2022-07-07 DIAGNOSIS — I10 Essential (primary) hypertension: Secondary | ICD-10-CM | POA: Diagnosis not present

## 2022-07-07 DIAGNOSIS — G2581 Restless legs syndrome: Secondary | ICD-10-CM | POA: Diagnosis not present

## 2022-10-07 ENCOUNTER — Encounter: Payer: Medicare Other | Admitting: Internal Medicine

## 2022-10-07 DIAGNOSIS — I495 Sick sinus syndrome: Secondary | ICD-10-CM | POA: Insufficient documentation

## 2022-10-07 DIAGNOSIS — Z950 Presence of cardiac pacemaker: Secondary | ICD-10-CM | POA: Insufficient documentation

## 2022-10-28 ENCOUNTER — Encounter: Payer: Self-pay | Admitting: Internal Medicine

## 2023-04-13 ENCOUNTER — Other Ambulatory Visit (HOSPITAL_COMMUNITY): Payer: Self-pay | Admitting: Family Medicine

## 2023-04-13 DIAGNOSIS — K7460 Unspecified cirrhosis of liver: Secondary | ICD-10-CM

## 2023-04-28 ENCOUNTER — Other Ambulatory Visit: Payer: Self-pay | Admitting: Family Medicine

## 2023-04-28 DIAGNOSIS — Z720 Tobacco use: Secondary | ICD-10-CM

## 2023-04-29 ENCOUNTER — Other Ambulatory Visit: Payer: Self-pay | Admitting: Family Medicine

## 2023-04-29 DIAGNOSIS — M5136 Other intervertebral disc degeneration, lumbar region: Secondary | ICD-10-CM

## 2023-07-03 ENCOUNTER — Encounter (HOSPITAL_COMMUNITY): Payer: Self-pay

## 2023-07-03 ENCOUNTER — Emergency Department (HOSPITAL_COMMUNITY)
Admission: EM | Admit: 2023-07-03 | Discharge: 2023-07-03 | Disposition: A | Discharge status: Home / Self Care | Payer: 59 | Attending: Emergency Medicine | Admitting: Emergency Medicine

## 2023-07-03 ENCOUNTER — Other Ambulatory Visit: Payer: Self-pay

## 2023-07-03 ENCOUNTER — Emergency Department (HOSPITAL_COMMUNITY): Payer: 59

## 2023-07-03 DIAGNOSIS — W010XXA Fall on same level from slipping, tripping and stumbling without subsequent striking against object, initial encounter: Secondary | ICD-10-CM | POA: Diagnosis not present

## 2023-07-03 DIAGNOSIS — I10 Essential (primary) hypertension: Secondary | ICD-10-CM | POA: Diagnosis not present

## 2023-07-03 DIAGNOSIS — S52532A Colles' fracture of left radius, initial encounter for closed fracture: Secondary | ICD-10-CM | POA: Insufficient documentation

## 2023-07-03 DIAGNOSIS — S6992XA Unspecified injury of left wrist, hand and finger(s), initial encounter: Secondary | ICD-10-CM | POA: Diagnosis present

## 2023-07-03 DIAGNOSIS — M7989 Other specified soft tissue disorders: Secondary | ICD-10-CM | POA: Diagnosis not present

## 2023-07-03 DIAGNOSIS — Z79899 Other long term (current) drug therapy: Secondary | ICD-10-CM | POA: Insufficient documentation

## 2023-07-03 DIAGNOSIS — S52615A Nondisplaced fracture of left ulna styloid process, initial encounter for closed fracture: Secondary | ICD-10-CM | POA: Insufficient documentation

## 2023-07-03 DIAGNOSIS — W19XXXA Unspecified fall, initial encounter: Secondary | ICD-10-CM

## 2023-07-03 DIAGNOSIS — Z7982 Long term (current) use of aspirin: Secondary | ICD-10-CM | POA: Insufficient documentation

## 2023-07-03 MED ORDER — ACETAMINOPHEN-CODEINE 300-30 MG PO TABS
1.0000 | ORAL_TABLET | Freq: Once | ORAL | Status: AC
  Administered 2023-07-03: 1 via ORAL
  Filled 2023-07-03: qty 1

## 2023-07-03 MED ORDER — ACETAMINOPHEN-CODEINE 300-30 MG PO TABS: 1.0000 | ORAL_TABLET | Freq: Four times a day (QID) | ORAL | 0 refills | Status: DC | PRN

## 2023-07-03 NOTE — Progress Notes (Signed)
Orthopedic Tech Progress Note Patient Details:  Julia Schwartz June 20, 1951 [id number]  Well-padded sugar tong splint applied to LUE in best obtainable fashion. Sling was applied afterwards. Motion and sensation of digits remain intact. Encouraged elevation and ice to help with pain/swelling. [name] rings from L hand are in a container that a family member put in their bag.   Ortho Devices Type of Ortho Device: Sugartong splint, Sling immobilizer Ortho Device/Splint Location: LUE Ortho Device/Splint Interventions: Ordered, Application, Adjustment   Post Interventions Patient Tolerated: Fair Instructions Provided: Care of device, Adjustment of device  [name] Carmine Savoy 07/03/2023, 6:11 PM

## 2023-07-03 NOTE — ED Triage Notes (Signed)
Pt arrives via POV. Pt states she tripped and fell last night, landing on her left wrist. Pt denies any other injury. Obvious deformity noted. PT is AxOx4

## 2023-07-03 NOTE — Discharge Instructions (Signed)
You will need to leave the cast on until you see the hand specialist.  No lifting or using the left hand at all.  Elevate when you can to help with swelling.  Take the pain medication as needed.

## 2023-07-03 NOTE — ED Notes (Signed)
MD Plunkett made aware of BP. Orders to have pt eat and drink fluids and hold on discharge. Pt given sandwich and water.

## 2023-07-03 NOTE — ED Notes (Signed)
Patient verbalizes understanding of discharge instructions. Opportunity for questioning and answers were provided. Armband removed by staff, pt discharged from ED. Pt taken to ED waiting room via wheel chair.

## 2023-07-03 NOTE — ED Provider Notes (Addendum)
[place] EMERGENCY DEPARTMENT AT [facility] Provider Note   CSN: [id number] Arrival date & time: 07/03/23  1506     History  Chief Complaint  Patient presents with   Wrist Injury    Julia Schwartz is a 72 y.o. female.  Patient is a 72 year old female with a history of hypertension, chronic back pain, TIA, atrial myxoma status postresection who is presenting today after she fell yesterday evening with ongoing pain and swelling of her left wrist.  Patient reports she tripped over her own feet and fell forward catching herself on her left hand.  Since that time her wrist has been painful and swollen.  She reports today she could not even hold her coffee cup because it hurt too bad.  She denies hitting her head, loss of consciousness.  She has no pain in her legs and has been able to walk since this fall.  She has not take any anticoagulation.  She denies any numbness or tingling of the hand.  The history is provided by the patient.  Wrist Injury      Home Medications Prior to Admission medications   Medication Sig Start Date End Date Taking? Authorizing Provider  amLODipine-benazepril (LOTREL) 10-40 MG capsule Take 1 capsule by mouth daily.    [provider]  aspirin EC 81 MG EC tablet Take 1 tablet (81 mg total) by mouth daily. 11/09/15   Ardelle Balls, PA-C  atorvastatin (LIPITOR) 20 MG tablet Take 20 mg by mouth daily.    [provider]  busPIRone (BUSPAR) 15 MG tablet Take 15 mg by mouth 3 (three) times daily.     [provider]  Cholecalciferol (VITAMIN D3) 2000 units capsule Take 2,000 Units by mouth daily.    [provider]  cyclobenzaprine (FLEXERIL) 10 MG tablet Take 10 mg by mouth 3 (three) times daily as needed for muscle spasms.    [provider]  diclofenac (VOLTAREN) 75 MG EC tablet Take 75 mg by mouth 2 (two) times daily.    [provider]  diclofenac Sodium (VOLTAREN) 1 % GEL Apply 4 g  topically 4 (four) times daily. 04/17/21   Melene Plan, DO  DULoxetine (CYMBALTA) 60 MG capsule Take 60 mg by mouth daily.    [provider]  furosemide (LASIX) 20 MG tablet Take 20 mg by mouth daily.    [provider]  metoprolol tartrate (LOPRESSOR) 25 MG tablet Take 0.5 tablets (12.5 mg total) by mouth 2 (two) times daily. 09/03/16 12/18/16  Lars Masson, MD  naloxegol oxalate (MOVANTIK) 12.5 MG TABS tablet Take 25 mg by mouth daily.    [provider]  omeprazole (PRILOSEC) 40 MG capsule Take 1 capsule (40 mg total) by mouth daily. 10/24/15   Ghimire, Werner Lean, MD  pregabalin (LYRICA) 75 MG capsule Take 75 mg by mouth 2 (two) times daily.     [provider]  rOPINIRole (REQUIP) 0.25 MG tablet Take 0.25 mg by mouth 2 (two) times daily.    [provider]  SYMBICORT 160-4.5 MCG/ACT inhaler Inhale 2 puffs into the lungs as needed.  12/02/15   [provider]  varenicline (CHANTIX) 1 MG tablet 1 mg 2 (two) times daily.    [provider]      Allergies    Ultram [tramadol]    Review of Systems   Review of Systems  Physical Exam Updated Vital Signs BP 119/70 (BP Location: Left Arm)   Pulse 70  Temp 98.4 F (36.9 C) (Oral)   Resp 16   Ht 5\' 5"  (1.651 m)   Wt 81.6 kg   SpO2 98%   BMI 29.95 kg/m  Physical Exam Vitals and nursing note reviewed.  Constitutional:      General: She is not in acute distress. HENT:     Head: Normocephalic and atraumatic.  Eyes:     Pupils: Pupils are equal, round, and reactive to light.  Cardiovascular:     Rate and Rhythm: Normal rate.     Pulses: Normal pulses.  Pulmonary:     Effort: Pulmonary effort is normal.  Chest:     Chest wall: No tenderness.  Musculoskeletal:        General: Tenderness and deformity present.     Left wrist: Swelling, deformity, tenderness and bony tenderness present. Decreased range of motion. Normal pulse.     Cervical back: Normal range of motion and  neck supple. No tenderness.     Comments: Normal left hand grip with normal movement of all 5 fingers and sensation intact.  2+ left radial pulse  Neurological:     Mental Status: She is alert.     ED Results / Procedures / Treatments   Labs (all labs ordered are listed, but only abnormal results are displayed) Labs Reviewed - No data to display  EKG None  Radiology DG Forearm Left  Result Date: 07/03/2023 CLINICAL DATA:  Fall, pain EXAM: LEFT FOREARM - 2 VIEW; LEFT WRIST - COMPLETE 4 VIEW COMPARISON:  None Available. FINDINGS: Transverse minimally displaced Colles fracture distal radius. Avulsion injury involving the ulnar styloid process. Radiocarpal joint space narrowing and sclerosis base of the thumb consistent with osteoarthritis. No osteolytic or osteoblastic lesions. Proximal radius and ulna are intact. 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 IMPRESSION: 1. Degenerative changes wrist and elbow. 2. Colles fracture radius. 3. Nondisplaced fracture ulnar styloid. Electronically Signed   By: Layla Maw M.D.   On: 07/03/2023 16:17   DG Wrist Complete Left  Result Date: 07/03/2023 CLINICAL DATA:  Fall, pain EXAM: LEFT FOREARM - 2 VIEW; LEFT WRIST - COMPLETE 4 VIEW COMPARISON:  None Available. FINDINGS: Transverse minimally displaced Colles fracture distal radius. Avulsion injury involving the ulnar styloid process. Radiocarpal joint space narrowing and sclerosis base of the thumb consistent with osteoarthritis. No osteolytic or osteoblastic lesions. Proximal radius and ulna are intact. 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 IMPRESSION: 1. Degenerative changes wrist and elbow. 2. Colles fracture radius. 3. Nondisplaced fracture ulnar styloid. Electronically Signed   By: Layla Maw M.D.   On: 07/03/2023 16:17    Procedures Procedures    Medications Ordered in ED Medications  acetaminophen-codeine (TYLENOL #3) 300-30 MG per tablet 1 tablet (1 tablet Oral Given 07/03/23 1702)    ED Course/  Medical Decision Making/ A&P                                 Medical Decision Making Amount and/or Complexity of Data Reviewed Radiology: ordered and independent interpretation performed. Decision-making details documented in ED Course.  Risk Prescription drug management.   Pt with multiple medical problems and comorbidities and presenting today with a complaint that caries a high risk for morbidity and mortality.  Here today after a fall and a left wrist deformity.  Patient denies any head injury or loss of consciousness.  She remembers the entire event.  Patient does have deformity of the arm.  This occurred  last night reports otherwise she is able to ambulate and low suspicion for lower extremity injury.  Patient is neurovascularly intact in the left upper extremity.  I have independently visualized and interpreted pt's images today.  X-rays today show a left distal radius fracture which is closed and minimally displaced as well as an ulnar styloid fracture.  Radiology reports patient has a Colles' fracture of the radius and avulsion fracture of the ulnar styloid and some radiocarpal joint space narrowing.  No evidence of dislocation at this time.  Normal sensation and capillary refill in the fingers.  Patient placed in a sugar-tong splint, given a sling and will need follow-up with hand surgery.  She was given pain control.  She is comfortable with this plan.  No social determinants affecting her discharge today.  Evaluated patient at the splint and she remains neurovascularly intact and is stable for discharge.        Final Clinical Impression(s) / ED Diagnoses Final diagnoses:  Fall, initial encounter  Closed Colles' fracture of left radius, initial encounter  Closed nondisplaced fracture of styloid process of left ulna, initial encounter    Rx / DC Orders ED Discharge Orders     None         Gwyneth Sprout, MD 07/03/23 1705    Gwyneth Sprout, MD 07/03/23  1818

## 2023-07-19 ENCOUNTER — Encounter (HOSPITAL_COMMUNITY): Payer: Self-pay | Admitting: Orthopedic Surgery

## 2023-07-19 NOTE — Progress Notes (Signed)
Unable to reach patient via phone after multiple attempts during the day.  LMOM with instructions for DOS.  PCP - Dr Tobias Alexander Cardiologist - none since 12/2016 CP- Dr Graciela Husbands  Chest x-ray - n/a EKG - DOS Stress Test - n/a ECHO - 12/15/16 Cardiac Cath - 10/23/15  ICD Pacemaker - Medtronic Dual Pacemaker inserted on 11/14/15.  Last remote check was on 02/22/17.  Perioperative Cardiac Device Programming initiated and awaiting results.  Med[employer] Rep Tobie Lords was notified via email.  Payton at the OR desk was notified via phone.  Sleep Study -  n/a CPAP - none  Diabetes - n/a  Aspirin Instructions: Follow your surgeon's instructions on when to stop aspirin prior to surgery,  If no instructions were given by your surgeon then you will need to call the office for those instructions.  ERAS: Clear liquids til 12 noon DOS.  Anesthesia review: Yes  STOP now taking any Aspirin (unless otherwise instructed by your surgeon), Aleve, Naproxen, Ibuprofen, Motrin, Voltaren, Advil, Goody's, BC's, all herbal medications, fish oil, and all vitamins.

## 2023-07-20 ENCOUNTER — Encounter (HOSPITAL_COMMUNITY): Payer: Self-pay | Admitting: Orthopedic Surgery

## 2023-07-20 ENCOUNTER — Encounter (HOSPITAL_COMMUNITY): Payer: Self-pay | Admitting: Vascular Surgery

## 2023-07-20 NOTE — Progress Notes (Signed)
Anesthesia Chart Review: SAME DAY WORK-UP  Case: [id number] Date/Time: 07/21/23 1500   Procedure: OPEN REDUCTION INTERNAL FIXATION (ORIF) DISTAL RADIUS FRACTURE (Left) - Dr. Melvyn Novas would like to start at 8:30am. Regional with IV Sedation   Anesthesia type: Regional   Pre-op diagnosis: Left distal radius fracture displaced   Location: [facility] OR ROOM 07 / [facility] OR   Surgeons: Bradly Bienenstock, MD       DISCUSSION: Patient is a 72 year old female scheduled for the above procedure. She had a mechanical fall on 07/02/23 and sustained a minimally displaced left distal radius fracture and ulnar styloid fracture. 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  Based on records in [facility], patient had not seen cardiology since 09/03/16. 10/23/15 LHC showed normal coronaries. S/p median sternotomy for resection of 3 cm LA myxoma with pericardial patch closure of ASD on 11/04/15. 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 At last visit with Dr. Tobias Alexander, she was going okay from a cardiac standpoint but was depressed following the death of her youngest son due to liver disease August 2017. Given her CVA and post-operative PAF (CHADS2-VASc=4), continuation of warfarin was planned at that time. Six month follow-up had been planned. Warfarin was later discontinued by neurologist Dr. Marvel Plan and CT surgeon  Dr. Kathlee Nations Trigt on 12/18/16 given CVA was likely due to myxoma which had been resected, she had not had any known recurrent PAF, and patient was having difficulty with routine INR monitoring. Also her follow-up echo on 12/15/16 showed LVEF 50-55%, no wall motion abnormalities, grade 2 diastolic dysfunction, normal RV systolic function, normal LA size, no ASD or PFO, mild TR, PA peak pressure 25 mmHg.  Last EP visit with Dr. Sherryl Manges was on 02/25/16. Last PPM remote interrogation seen is from 02/22/17. Normal remote. Battery longevity 8 years, 11 months.    She has acute radial fracture. It has been several years since her PPM was interrogated, so Med[employer] Rep will need to be notified. Discussed with anesthesiologist Eilene Ghazi, MD. Anesthesia team to evaluate on the day of surgery.    VS: 07/03/23: WT 81.6 KG. BP 108/75, HR 90    PROVIDERS: - As of 08/12/22, PCP was Sharmon Revere, MD with [facility].  - She was referred to get re-established with EP Sherryl Manges, [facility]. She had an appointment scheduled for 10/07/22, but she no-showed. EP messages indicate 3 attempts were made to contact patient to reschedule but with inability to leave a message. Her primary cardiologist was previously Tobias Alexander, MD, but last visit was 09/03/16.  - Kerin Perna, MD was her CT surgeon.  Marvel Plan, MD was her neurologist   LABS: For day of surgery as indicated. As of 08/12/22 visit at [facility] (scanned under Media tab): Creatinine was 1.06, glucose 112, AST 48, ALT 33, WBC 10.5, H/H 13.5/39.9, PLT 267, TSH 1.21.    IMAGES: Xray left forearm/wrist 07/03/23: IMPRESSION: 1. Degenerative changes  wrist and elbow. 2. Colles fracture radius. 3. Nondisplaced fracture ulnar styloid.   EKG: Last EKG seen is > 1 year ago. EKG 04/17/21: Sinus rhythm with sinus arrhythmia with short PR Minimal voltage criteria for LVH, may be normal variant ( R in aVL ) Inferior infarct , age  undetermined Abnormal ECG No significant change since last tracing Confirmed by Melene Plan [phone]) on 04/17/2021 6:51:18 PM   CV: Echo 12/15/16: Study Conclusions  - Left ventricle: The cavity size was normal. There was mild    concentric hypertrophy. Systolic function was normal. The    estimated ejection fraction was in the range of 50% to 55%. Wall    motion was normal; there were no regional wall motion    abnormalities. Features are consistent with a pseudonormal left    ventricular filling pattern, with concomitant abnormal relaxation    and increased filling pressure (grade 2 diastolic dysfunction).  - Aortic valve: Transvalvular velocity was within the normal range.    There was no stenosis. There was no regurgitation.  - Mitral valve: Transvalvular velocity was within the normal range.    There was no evidence for stenosis. There was no regurgitation.  - Right ventricle: The cavity size was normal. Wall thickness was    normal. Systolic function was normal.  - Atrial septum: No defect or patent foramen ovale was identified    by color flow Doppler.  - Tricuspid valve: There was mild regurgitation.  - Pulmonary arteries: Systolic pressure was within the normal    range. PA peak pressure: 25 mm Hg (S).    US Carotid 10/24/15: Summary:  1. Carotid Duplex evaluation - No evidence of stenosis in bilateral     ICA&'s.    LHC 10/23/15: Normal coronary arteries Normal left ventricular function    Past Medical History:  Diagnosis Date   Anxiety    Arthritis    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 Echo 2/17:  Mild focal basal septal hypertrophy, EF 50-55%, no RWMA, Gr 1 DD, trivial MR, normal RVF, mild TR, pericardial patch in interatrial septum patent - no residual flow   Chronic back pain    Depression    Fibromyalgia    GERD (gastroesophageal reflux disease)    Hepatitis    hep  c    dx 2007   Hypertension    PAF (paroxysmal atrial fibrillation) (HCC)     Post-operative PAF 10/2015   Stroke (HCC) 10/07/2015   Tachycardia-bradycardia syndrome (HCC)    s/p dual chamber Medtronic [id number] Advisa DR MRI PPM 11/14/15   Wears dentures    top-partial bottom   Wears glasses     Past Surgical History:  Procedure Laterality Date   ABDOMINAL HYSTERECTOMY     BUNIONECTOMY     both feet   CARDIAC CATHETERIZATION N/A 10/23/2015   Procedure: Left Heart Cath and Coronary Angiography;  Surgeon: Lyn Records, MD;  Location: [facility] INVASIVE CV LAB;  Service: Cardiovascular;  Laterality: N/A;   EP IMPLANTABLE DEVICE N/A 11/14/2015   Procedure: Pacemaker Implant;  Surgeon: Duke Salvia, MD;  Location: [facility] INVASIVE CV LAB;  Service: Cardiovascular;  Laterality: N/A;   EXCISION OF ATRIAL MYXOMA N/A 11/04/2015   Procedure: RESECTION OF LEFT ATRIAL MYXOMA, BI-ATRIAL APPROACH WITH PERICARDIAL PATCH;  Surgeon: Kerin Perna, MD;  Location: [facility] OR;  Service: Open Heart Surgery;  Laterality: N/A;   FRACTURE SURGERY     fx lt ankle   SUBMANDIBULAR  GLAND EXCISION     left   TEE WITHOUT CARDIOVERSION N/A 10/18/2015   Procedure: TRANSESOPHAGEAL ECHOCARDIOGRAM (TEE);  Surgeon: Lars Masson, MD;  Location: [facility] ENDOSCOPY;  Service: Cardiovascular;  Laterality: N/A;   TEE WITHOUT CARDIOVERSION N/A 11/04/2015   Procedure: TRANSESOPHAGEAL ECHOCARDIOGRAM (TEE);  Surgeon: Kerin Perna, MD;  Location: [facility] OR;  Service: Open Heart Surgery;  Laterality: N/A;   TONSILLECTOMY      MEDICATIONS: No current facility-administered medications for this encounter.    acetaminophen-codeine (TYLENOL #3) 300-30 MG tablet   amLODipine-benazepril (LOTREL) 10-40 MG capsule   aspirin EC 81 MG EC tablet   atorvastatin (LIPITOR) 20 MG tablet   busPIRone (BUSPAR) 15 MG tablet   Cholecalciferol (VITAMIN D3) 2000 units capsule   cyclobenzaprine (FLEXERIL) 10 MG tablet   diclofenac (VOLTAREN) 75 MG EC tablet   diclofenac Sodium (VOLTAREN) 1 % GEL   DULoxetine (CYMBALTA) 60 MG capsule    furosemide (LASIX) 20 MG tablet   metoprolol tartrate (LOPRESSOR) 25 MG tablet   naloxegol oxalate (MOVANTIK) 12.5 MG TABS tablet   omeprazole (PRILOSEC) 40 MG capsule   pregabalin (LYRICA) 75 MG capsule   rOPINIRole (REQUIP) 0.25 MG tablet   SYMBICORT 160-4.5 MCG/ACT inhaler   varenicline (CHANTIX) 1 MG tablet    Shonna Chock, PA-C Surgical Short Stay/Anesthesiology [facility] Phone [phone] [facility] Phone [phone] 07/20/2023 1:51 PM

## 2023-07-21 ENCOUNTER — Encounter (HOSPITAL_COMMUNITY): Payer: Self-pay | Admitting: Anesthesiology

## 2023-07-21 ENCOUNTER — Ambulatory Visit (HOSPITAL_COMMUNITY): Admission: RE | Admit: 2023-07-21 | Payer: 59 | Source: Home / Self Care | Admitting: Orthopedic Surgery

## 2023-07-21 HISTORY — DX: Sick sinus syndrome: I49.5

## 2023-07-21 HISTORY — DX: Paroxysmal atrial fibrillation: I48.0

## 2023-07-21 SURGERY — OPEN REDUCTION INTERNAL FIXATION (ORIF) DISTAL RADIUS FRACTURE
Anesthesia: Regional | Laterality: Left

## 2023-07-29 ENCOUNTER — Encounter: Payer: Self-pay | Admitting: General Practice

## 2023-09-03 ENCOUNTER — Ambulatory Visit: Payer: 59 | Admitting: Cardiovascular Disease

## 2023-10-20 DEATH — deceased

## 2023-10-21 ENCOUNTER — Encounter: Payer: 59 | Admitting: Cardiovascular Disease
